# Patient Record
Sex: Female | Born: 1955
Health system: Southern US, Community
[De-identification: ages and names within clinical notes are randomized; demographics above are authoritative.]

## PROBLEM LIST (undated history)

## (undated) DIAGNOSIS — G4730 Sleep apnea, unspecified: Secondary | ICD-10-CM

## (undated) DIAGNOSIS — I10 Essential (primary) hypertension: Secondary | ICD-10-CM

## (undated) DIAGNOSIS — E559 Vitamin D deficiency, unspecified: Secondary | ICD-10-CM

## (undated) DIAGNOSIS — R7303 Prediabetes: Secondary | ICD-10-CM

## (undated) DIAGNOSIS — E663 Overweight: Secondary | ICD-10-CM

## (undated) DIAGNOSIS — R0600 Dyspnea, unspecified: Secondary | ICD-10-CM

## (undated) DIAGNOSIS — M549 Dorsalgia, unspecified: Secondary | ICD-10-CM

## (undated) DIAGNOSIS — M479 Spondylosis, unspecified: Secondary | ICD-10-CM

## (undated) DIAGNOSIS — R0609 Other forms of dyspnea: Secondary | ICD-10-CM

## (undated) DIAGNOSIS — K8020 Calculus of gallbladder without cholecystitis without obstruction: Secondary | ICD-10-CM

## (undated) DIAGNOSIS — E538 Deficiency of other specified B group vitamins: Secondary | ICD-10-CM

## (undated) DIAGNOSIS — K648 Other hemorrhoids: Secondary | ICD-10-CM

## (undated) DIAGNOSIS — E039 Hypothyroidism, unspecified: Secondary | ICD-10-CM

## (undated) DIAGNOSIS — R0602 Shortness of breath: Secondary | ICD-10-CM

## (undated) DIAGNOSIS — M2550 Pain in unspecified joint: Secondary | ICD-10-CM

## (undated) DIAGNOSIS — K219 Gastro-esophageal reflux disease without esophagitis: Secondary | ICD-10-CM

## (undated) DIAGNOSIS — K5900 Constipation, unspecified: Secondary | ICD-10-CM

## (undated) DIAGNOSIS — M1990 Unspecified osteoarthritis, unspecified site: Secondary | ICD-10-CM

## (undated) DIAGNOSIS — I517 Cardiomegaly: Secondary | ICD-10-CM

## (undated) DIAGNOSIS — T7840XA Allergy, unspecified, initial encounter: Secondary | ICD-10-CM

## (undated) DIAGNOSIS — R011 Cardiac murmur, unspecified: Secondary | ICD-10-CM

## (undated) DIAGNOSIS — E785 Hyperlipidemia, unspecified: Secondary | ICD-10-CM

## (undated) HISTORY — DX: Dorsalgia, unspecified: M54.9

## (undated) HISTORY — DX: Hyperlipidemia, unspecified: E78.5

## (undated) HISTORY — DX: Overweight: E66.3

## (undated) HISTORY — DX: Shortness of breath: R06.02

## (undated) HISTORY — DX: Cardiomegaly: I51.7

## (undated) HISTORY — DX: Dyspnea, unspecified: R06.00

## (undated) HISTORY — DX: Constipation, unspecified: K59.00

## (undated) HISTORY — DX: Essential (primary) hypertension: I10

## (undated) HISTORY — DX: Allergy, unspecified, initial encounter: T78.40XA

## (undated) HISTORY — DX: Other forms of dyspnea: R06.09

## (undated) HISTORY — DX: Pain in unspecified joint: M25.50

## (undated) HISTORY — DX: Sleep apnea, unspecified: G47.30

## (undated) HISTORY — DX: Cardiac murmur, unspecified: R01.1

## (undated) HISTORY — DX: Hypothyroidism, unspecified: E03.9

## (undated) HISTORY — DX: Unspecified osteoarthritis, unspecified site: M19.90

## (undated) HISTORY — DX: Spondylosis, unspecified: M47.9

## (undated) HISTORY — PX: COLONOSCOPY: SHX174

## (undated) HISTORY — DX: Other hemorrhoids: K64.8

## (undated) HISTORY — DX: Calculus of gallbladder without cholecystitis without obstruction: K80.20

## (undated) HISTORY — DX: Vitamin D deficiency, unspecified: E55.9

## (undated) HISTORY — DX: Deficiency of other specified B group vitamins: E53.8

---

## 1997-03-22 HISTORY — PX: WISDOM TOOTH EXTRACTION: SHX21

## 1998-01-27 ENCOUNTER — Ambulatory Visit (HOSPITAL_COMMUNITY): Admission: RE | Admit: 1998-01-27 | Discharge: 1998-01-27 | Payer: Self-pay | Admitting: Obstetrics and Gynecology

## 1998-01-30 ENCOUNTER — Ambulatory Visit (HOSPITAL_COMMUNITY): Admission: RE | Admit: 1998-01-30 | Discharge: 1998-01-30 | Payer: Self-pay | Admitting: Obstetrics and Gynecology

## 1998-02-28 ENCOUNTER — Other Ambulatory Visit: Admission: RE | Admit: 1998-02-28 | Discharge: 1998-02-28 | Payer: Self-pay | Admitting: Obstetrics and Gynecology

## 1998-03-10 ENCOUNTER — Other Ambulatory Visit: Admission: RE | Admit: 1998-03-10 | Discharge: 1998-03-10 | Payer: Self-pay | Admitting: Obstetrics and Gynecology

## 1999-07-31 ENCOUNTER — Encounter: Payer: Self-pay | Admitting: Internal Medicine

## 1999-07-31 ENCOUNTER — Ambulatory Visit (HOSPITAL_COMMUNITY): Admission: RE | Admit: 1999-07-31 | Discharge: 1999-07-31 | Payer: Self-pay | Admitting: Internal Medicine

## 1999-08-05 ENCOUNTER — Ambulatory Visit (HOSPITAL_COMMUNITY): Admission: RE | Admit: 1999-08-05 | Discharge: 1999-08-05 | Payer: Self-pay | Admitting: Obstetrics and Gynecology

## 1999-08-05 ENCOUNTER — Encounter: Payer: Self-pay | Admitting: Obstetrics and Gynecology

## 2000-06-08 ENCOUNTER — Other Ambulatory Visit: Admission: RE | Admit: 2000-06-08 | Discharge: 2000-06-08 | Payer: Self-pay | Admitting: Obstetrics and Gynecology

## 2000-09-21 ENCOUNTER — Encounter: Payer: Self-pay | Admitting: Internal Medicine

## 2000-09-21 ENCOUNTER — Ambulatory Visit (HOSPITAL_COMMUNITY): Admission: RE | Admit: 2000-09-21 | Discharge: 2000-09-21 | Payer: Self-pay | Admitting: Internal Medicine

## 2000-12-15 ENCOUNTER — Ambulatory Visit (HOSPITAL_COMMUNITY): Admission: RE | Admit: 2000-12-15 | Discharge: 2000-12-15 | Payer: Self-pay | Admitting: Obstetrics and Gynecology

## 2000-12-15 ENCOUNTER — Encounter: Payer: Self-pay | Admitting: Obstetrics and Gynecology

## 2001-12-20 ENCOUNTER — Ambulatory Visit (HOSPITAL_COMMUNITY): Admission: RE | Admit: 2001-12-20 | Discharge: 2001-12-20 | Payer: Self-pay | Admitting: Internal Medicine

## 2001-12-20 ENCOUNTER — Encounter: Payer: Self-pay | Admitting: Internal Medicine

## 2002-06-26 ENCOUNTER — Other Ambulatory Visit: Admission: RE | Admit: 2002-06-26 | Discharge: 2002-06-26 | Payer: Self-pay | Admitting: Obstetrics and Gynecology

## 2002-07-09 ENCOUNTER — Encounter: Payer: Self-pay | Admitting: Obstetrics and Gynecology

## 2002-07-09 ENCOUNTER — Ambulatory Visit (HOSPITAL_COMMUNITY): Admission: RE | Admit: 2002-07-09 | Discharge: 2002-07-09 | Payer: Self-pay | Admitting: Obstetrics and Gynecology

## 2003-05-16 ENCOUNTER — Ambulatory Visit (HOSPITAL_COMMUNITY): Admission: RE | Admit: 2003-05-16 | Discharge: 2003-05-16 | Payer: Self-pay | Admitting: Internal Medicine

## 2003-06-04 ENCOUNTER — Ambulatory Visit (HOSPITAL_COMMUNITY): Admission: RE | Admit: 2003-06-04 | Discharge: 2003-06-04 | Payer: Self-pay | Admitting: Internal Medicine

## 2003-07-02 ENCOUNTER — Other Ambulatory Visit: Admission: RE | Admit: 2003-07-02 | Discharge: 2003-07-02 | Payer: Self-pay | Admitting: Obstetrics and Gynecology

## 2003-07-11 ENCOUNTER — Ambulatory Visit (HOSPITAL_COMMUNITY): Admission: RE | Admit: 2003-07-11 | Discharge: 2003-07-11 | Payer: Self-pay | Admitting: Obstetrics and Gynecology

## 2005-01-04 ENCOUNTER — Emergency Department (HOSPITAL_COMMUNITY): Admission: EM | Admit: 2005-01-04 | Discharge: 2005-01-04 | Payer: Self-pay | Admitting: Emergency Medicine

## 2005-08-26 ENCOUNTER — Ambulatory Visit (HOSPITAL_COMMUNITY): Admission: RE | Admit: 2005-08-26 | Discharge: 2005-08-26 | Payer: Self-pay | Admitting: Internal Medicine

## 2005-11-04 ENCOUNTER — Ambulatory Visit (HOSPITAL_COMMUNITY): Admission: RE | Admit: 2005-11-04 | Discharge: 2005-11-04 | Payer: Self-pay | Admitting: Internal Medicine

## 2005-11-08 ENCOUNTER — Ambulatory Visit (HOSPITAL_COMMUNITY): Admission: RE | Admit: 2005-11-08 | Discharge: 2005-11-08 | Payer: Self-pay | Admitting: Internal Medicine

## 2006-01-03 ENCOUNTER — Ambulatory Visit (HOSPITAL_COMMUNITY): Admission: RE | Admit: 2006-01-03 | Discharge: 2006-01-03 | Payer: Self-pay | Admitting: Obstetrics and Gynecology

## 2006-01-25 ENCOUNTER — Other Ambulatory Visit: Admission: RE | Admit: 2006-01-25 | Discharge: 2006-01-25 | Payer: Self-pay | Admitting: Obstetrics and Gynecology

## 2007-04-26 ENCOUNTER — Ambulatory Visit (HOSPITAL_COMMUNITY): Admission: RE | Admit: 2007-04-26 | Discharge: 2007-04-26 | Payer: Self-pay | Admitting: Obstetrics and Gynecology

## 2007-12-31 ENCOUNTER — Ambulatory Visit (HOSPITAL_COMMUNITY): Admission: RE | Admit: 2007-12-31 | Discharge: 2007-12-31 | Payer: Self-pay | Admitting: Emergency Medicine

## 2008-02-20 ENCOUNTER — Ambulatory Visit: Payer: Self-pay | Admitting: Internal Medicine

## 2008-03-01 ENCOUNTER — Encounter: Payer: Self-pay | Admitting: Internal Medicine

## 2008-03-01 ENCOUNTER — Ambulatory Visit: Payer: Self-pay | Admitting: Internal Medicine

## 2008-03-04 ENCOUNTER — Encounter: Payer: Self-pay | Admitting: Internal Medicine

## 2008-03-22 HISTORY — PX: KNEE SURGERY: SHX244

## 2008-04-19 DIAGNOSIS — I10 Essential (primary) hypertension: Secondary | ICD-10-CM

## 2008-04-19 DIAGNOSIS — K648 Other hemorrhoids: Secondary | ICD-10-CM

## 2008-04-19 DIAGNOSIS — E039 Hypothyroidism, unspecified: Secondary | ICD-10-CM | POA: Insufficient documentation

## 2008-04-19 HISTORY — DX: Essential (primary) hypertension: I10

## 2008-04-19 HISTORY — DX: Other hemorrhoids: K64.8

## 2008-04-19 HISTORY — DX: Hypothyroidism, unspecified: E03.9

## 2008-04-23 ENCOUNTER — Ambulatory Visit: Payer: Self-pay | Admitting: Internal Medicine

## 2008-04-23 DIAGNOSIS — R110 Nausea: Secondary | ICD-10-CM | POA: Insufficient documentation

## 2008-04-23 DIAGNOSIS — R143 Flatulence: Secondary | ICD-10-CM

## 2008-04-23 DIAGNOSIS — R142 Eructation: Secondary | ICD-10-CM

## 2008-04-23 DIAGNOSIS — K5900 Constipation, unspecified: Secondary | ICD-10-CM

## 2008-04-23 DIAGNOSIS — R141 Gas pain: Secondary | ICD-10-CM | POA: Insufficient documentation

## 2008-04-23 HISTORY — DX: Constipation, unspecified: K59.00

## 2008-05-08 ENCOUNTER — Ambulatory Visit (HOSPITAL_COMMUNITY): Admission: RE | Admit: 2008-05-08 | Discharge: 2008-05-08 | Payer: Self-pay | Admitting: Internal Medicine

## 2008-05-10 ENCOUNTER — Encounter: Payer: Self-pay | Admitting: Internal Medicine

## 2008-05-10 DIAGNOSIS — K8020 Calculus of gallbladder without cholecystitis without obstruction: Secondary | ICD-10-CM

## 2008-05-10 HISTORY — DX: Calculus of gallbladder without cholecystitis without obstruction: K80.20

## 2008-05-13 ENCOUNTER — Encounter: Payer: Self-pay | Admitting: Internal Medicine

## 2008-06-11 ENCOUNTER — Encounter: Payer: Self-pay | Admitting: Internal Medicine

## 2008-07-23 ENCOUNTER — Encounter: Admission: RE | Admit: 2008-07-23 | Discharge: 2008-07-23 | Payer: Self-pay | Admitting: Internal Medicine

## 2008-09-04 ENCOUNTER — Encounter: Admission: RE | Admit: 2008-09-04 | Discharge: 2008-09-04 | Payer: Self-pay | Admitting: Orthopedic Surgery

## 2008-09-13 ENCOUNTER — Ambulatory Visit (HOSPITAL_COMMUNITY): Admission: RE | Admit: 2008-09-13 | Discharge: 2008-09-13 | Payer: Self-pay | Admitting: Obstetrics and Gynecology

## 2008-09-18 ENCOUNTER — Encounter: Admission: RE | Admit: 2008-09-18 | Discharge: 2008-09-18 | Payer: Self-pay | Admitting: Obstetrics and Gynecology

## 2008-10-30 ENCOUNTER — Ambulatory Visit (HOSPITAL_COMMUNITY): Admission: RE | Admit: 2008-10-30 | Discharge: 2008-10-30 | Payer: Self-pay | Admitting: Orthopedic Surgery

## 2008-11-21 ENCOUNTER — Encounter: Admission: RE | Admit: 2008-11-21 | Discharge: 2008-12-24 | Payer: Self-pay | Admitting: Orthopedic Surgery

## 2009-02-19 ENCOUNTER — Encounter: Admission: RE | Admit: 2009-02-19 | Discharge: 2009-02-19 | Payer: Self-pay | Admitting: Orthopedic Surgery

## 2009-04-03 ENCOUNTER — Encounter: Admission: RE | Admit: 2009-04-03 | Discharge: 2009-07-02 | Payer: Self-pay | Admitting: Orthopedic Surgery

## 2009-07-18 ENCOUNTER — Encounter: Admission: RE | Admit: 2009-07-18 | Discharge: 2009-07-18 | Payer: Self-pay | Admitting: Orthopedic Surgery

## 2009-08-25 ENCOUNTER — Encounter: Admission: RE | Admit: 2009-08-25 | Discharge: 2009-08-25 | Payer: Self-pay | Admitting: Obstetrics and Gynecology

## 2010-04-11 ENCOUNTER — Encounter: Payer: Self-pay | Admitting: Internal Medicine

## 2010-04-12 ENCOUNTER — Encounter: Payer: Self-pay | Admitting: Obstetrics and Gynecology

## 2010-04-12 ENCOUNTER — Encounter: Payer: Self-pay | Admitting: Internal Medicine

## 2010-04-12 ENCOUNTER — Encounter: Payer: Self-pay | Admitting: Orthopedic Surgery

## 2010-06-27 LAB — BASIC METABOLIC PANEL
BUN: 17 mg/dL (ref 6–23)
CO2: 29 mEq/L (ref 19–32)
Chloride: 108 mEq/L (ref 96–112)
Glucose, Bld: 99 mg/dL (ref 70–99)
Potassium: 4.1 mEq/L (ref 3.5–5.1)

## 2010-06-27 LAB — CBC
HCT: 34.9 % — ABNORMAL LOW (ref 36.0–46.0)
MCV: 80.5 fL (ref 78.0–100.0)
Platelets: 248 10*3/uL (ref 150–400)
RDW: 16.5 % — ABNORMAL HIGH (ref 11.5–15.5)

## 2010-08-04 NOTE — Op Note (Signed)
NAMESARENA, JEZEK             ACCOUNT NO.:  [id number]   MEDICAL RECORD NO.:  [id number]          PATIENT TYPE:  AMB   LOCATION:  SDS                          FACILITY:  [facility]   PHYSICIAN:  Myrtie Neither, MD      DATE OF BIRTH:  1955/12/06   DATE OF PROCEDURE:  10/30/2008  DATE OF DISCHARGE:                               OPERATIVE REPORT   PREOPERATIVE DIAGNOSIS:  Medial meniscal tear, right knee.   POSTOPERATIVE DIAGNOSES:  Medial meniscal tear, chondral defect medial  femoral condyle, and intercondylar notch defect, patellar plica.   ANESTHESIA:  General.   PROCEDURES:  1. Arthroscopic partial medial meniscectomy.  2. Abrasion chondroplasty, medial femoral condyle.  3. Excision of patellar plica and synovectomy.   The patient was taken to the operating room after given adequate preop  medications, given general anesthesia and intubated, the right knee was  prepped with DuraPrep and draped in a sterile manner.  Tourniquet used  for hemostasis.  One-half inch puncture wound made along the  anteromedial and lateral joint line.  Inflow was through the medial  patellar pouch area.  Inspection of the joint revealed hypertrophic  overgrown patellar plica encompassing the entire anterior compartment  and medial compartment.  Chondral defect involving the medial femoral  condyle approximately the size of a fifty-cent piece and tear of the  medial meniscus along the posterior horn.  There is also chondral defect  at the intercondylar notch with loose edges.  With the basket forceps,  partial meniscectomy was done of the medial meniscus followed by use of  the meniscal synovial shaver for resecting the patellar plica and  synovectomy.  Abrasion chondroplasty was done for the medial femoral  condyle.  Loose fragments were removed with the synovial shaver.  The  chondral defect about the intercondylar notch was also smoothed down  with basket forceps removing the jagged edges.   Lateral compartment was  fairly well preserved.  ACL was intact.  Wound closure was then done  with 4-0 nylon.  A 14 mL of 0.25% plain Marcaine was injected into the  knee.  Compressive dressing was applied.  The patient tolerated the  procedure quite well, taken to recovery room in stable and satisfactory  condition.   The patient being discharged home, nonweightbearing on the right side  with use of crutches, ice packs, elevation.  Return to the office in 1  week.  The patient being discharged on Percocet 1-2 q.6 p.r.n. for pain.      Myrtie Neither, MD  Electronically Signed     AC/MEDQ  D:  10/30/2008  T:  10/30/2008  Job:  [id number]

## 2010-08-08 IMAGING — NM NM HEPATO W/GB/PHARM/[PERSON_NAME]
2 series · 12 of 12 positions shown · non-contrast
Comparison: none

CLINICAL DATA: Abdominal pain, cholelithiasis

NUCLEAR MEDICINE HEPATOBILIARY IMAGING WITH GALLBLADDER EF
TECHNIQUE: Sequential images of the abdomen were obtained [DATE]
minutes following intravenous administration of
radiopharmaceutical.  After slow intravenous infusion of 2.5 ucg
Cholecystokinin, gallbladder ejection fraction was determined.
Radiopharmaceutical:  5.2 mCi Zc-OOm Choletec

[Series 1: he hepato · 4.71mm/px · 6 of 30 frames shown (1 of 2)]
[frame 3/30]
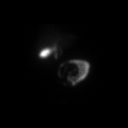
[frame 8/30]
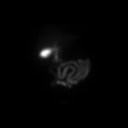
[frame 13/30]
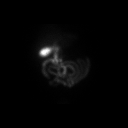
[frame 18/30]
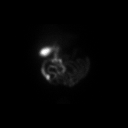
[frame 23/30]
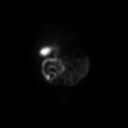
[frame 28/30]
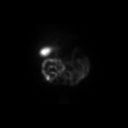

[Series 1: he hepato · 4.71mm/px · 6 of 60 frames shown (2 of 2)]
[frame 6/60]
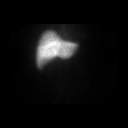
[frame 16/60]
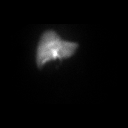
[frame 26/60]
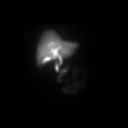
[frame 36/60]
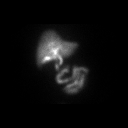
[frame 46/60]
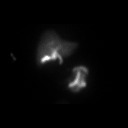
[frame 56/60]
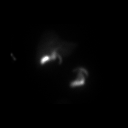

[12 of 12 positions shown; findings below may reference images not displayed]

FINDINGS: There is prompt extraction of radiotracer from the blood
pool and homogeneous uptake within the liver.  The gallbladder is
evident by 25 minutes.  Counts are present within the bowel by 25
minutes.  Upon administration of a cholecystokinin, the gallbladder
contracts minimally with a calculated ejection fraction = 16%  at
30 min (Normal greater than 30 % ejection)
IMPRESSION: 1. Abnormal low gallbladder ejection fraction =  16% suggests
biliary dyskinesia.

2. Patent cystic duct and common bile duct.

## 2010-11-04 ENCOUNTER — Other Ambulatory Visit (HOSPITAL_COMMUNITY): Payer: Self-pay | Admitting: Obstetrics and Gynecology

## 2010-11-04 DIAGNOSIS — Z1231 Encounter for screening mammogram for malignant neoplasm of breast: Secondary | ICD-10-CM

## 2010-11-10 ENCOUNTER — Ambulatory Visit (HOSPITAL_COMMUNITY)
Admission: RE | Admit: 2010-11-10 | Discharge: 2010-11-10 | Disposition: A | Discharge status: Home / Self Care | Payer: BC Managed Care – PPO | Source: Ambulatory Visit | Attending: Obstetrics and Gynecology | Admitting: Obstetrics and Gynecology

## 2010-11-10 DIAGNOSIS — Z1231 Encounter for screening mammogram for malignant neoplasm of breast: Secondary | ICD-10-CM | POA: Insufficient documentation

## 2011-07-06 ENCOUNTER — Other Ambulatory Visit: Payer: Self-pay | Admitting: Internal Medicine

## 2011-07-06 ENCOUNTER — Ambulatory Visit
Admission: RE | Admit: 2011-07-06 | Discharge: 2011-07-06 | Disposition: A | Discharge status: Home / Self Care | Payer: BC Managed Care – PPO | Source: Ambulatory Visit | Attending: Internal Medicine | Admitting: Internal Medicine

## 2011-07-06 DIAGNOSIS — R05 Cough: Secondary | ICD-10-CM

## 2011-12-03 ENCOUNTER — Other Ambulatory Visit: Payer: Self-pay | Admitting: Internal Medicine

## 2011-12-03 ENCOUNTER — Ambulatory Visit
Admission: RE | Admit: 2011-12-03 | Discharge: 2011-12-03 | Disposition: A | Discharge status: Home / Self Care | Payer: BC Managed Care – PPO | Source: Ambulatory Visit | Attending: Internal Medicine | Admitting: Internal Medicine

## 2011-12-03 DIAGNOSIS — R05 Cough: Secondary | ICD-10-CM

## 2012-01-11 ENCOUNTER — Other Ambulatory Visit: Payer: Self-pay | Admitting: Obstetrics and Gynecology

## 2012-01-11 DIAGNOSIS — Z1231 Encounter for screening mammogram for malignant neoplasm of breast: Secondary | ICD-10-CM

## 2012-02-02 ENCOUNTER — Ambulatory Visit: Payer: Self-pay | Admitting: Obstetrics and Gynecology

## 2012-02-10 ENCOUNTER — Ambulatory Visit (INDEPENDENT_AMBULATORY_CARE_PROVIDER_SITE_OTHER): Payer: BC Managed Care – PPO | Admitting: Obstetrics and Gynecology

## 2012-02-10 ENCOUNTER — Encounter: Payer: Self-pay | Admitting: Obstetrics and Gynecology

## 2012-02-10 VITALS — BP 118/78 | Ht 65.6 in | Wt 270.0 lb

## 2012-02-10 DIAGNOSIS — Z01419 Encounter for gynecological examination (general) (routine) without abnormal findings: Secondary | ICD-10-CM

## 2012-02-10 DIAGNOSIS — Z124 Encounter for screening for malignant neoplasm of cervix: Secondary | ICD-10-CM

## 2012-02-10 DIAGNOSIS — N904 Leukoplakia of vulva: Secondary | ICD-10-CM

## 2012-02-10 MED ORDER — NYSTATIN-TRIAMCINOLONE 100000-0.1 UNIT/GM-% EX OINT: TOPICAL_OINTMENT | Freq: Two times a day (BID) | CUTANEOUS | Status: DC | PRN

## 2012-02-10 MED ORDER — CLOBETASOL PROPIONATE 0.05 % EX OINT: TOPICAL_OINTMENT | Freq: Two times a day (BID) | CUTANEOUS | Status: DC

## 2012-02-10 NOTE — Progress Notes (Signed)
Last Pap: 01/29/2010 WNL: Yes Regular Periods:no Contraception: none  Monthly Breast exam:yes Tetanus<63yrs:no Nl.Bladder Function:yes Daily BMs:yes Healthy Diet:yes Calcium:yes Mammogram:yes Date of Mammogram: 11/04/2010 Exercise:yes Have often Exercise: 3 times a week  Seatbelt: yes Abuse at home: no Stressful work:no Sigmoid-colonoscopy: 4 years ago  Bone Density: Yes 02/03/11 PCP: Dr.Shelton Change in PMH: unchanged Change in ZOX:[id number]   Subjective:    CELE MOTE is a 56 y.o. female G2P2002 who presents for annual exam.  The patient has no complaints today. No significant menopausal sx.  Lichen sclerosis stable with prn clobetasol use.  Intermittent episodes of groin itching relieved by mycolog.  The following portions of the patient's history were reviewed and updated as appropriate: allergies, current medications, past family history, past medical history, past social history, past surgical history and problem list.  Review of Systems Pertinent items are noted in HPI. Gastrointestinal:No change in bowel habits, no abdominal pain, no rectal bleeding Genitourinary:negative for dysuria, frequency, hematuria, nocturia and urinary incontinence    Objective:     BP 118/78  Ht 5' 5.6" (1.666 m)  Wt 270 lb (122.471 kg)  BMI 44.11 kg/m2  Weight:  Wt Readings from Last 1 Encounters:  02/10/12 270 lb (122.471 kg)     BMI: Body mass index is 44.11 kg/(m^2). General Appearance: Alert, appropriate appearance for age. No acute distress HEENT: Grossly normal Neck / Thyroid: Supple, no masses, nodes or enlargement Lungs: clear to auscultation bilaterally Back: No CVA tenderness Breast Exam: No masses or nodes.No dimpling, nipple retraction or discharge. Cardiovascular: Regular rate and rhythm. S1, S2, no murmur Gastrointestinal: Soft, non-tender, no masses or organomegaly Pelvic Exam: External genitalia: Hypopigmented area at posterior fourchette, stable Vaginal:  atrophic mucosa Cervix: normal appearance Adnexa: no masses Uterus: does not feel enlarged but limited exam Exam limited by body habitus Rectovaginal: normal rectal, no masses Lymphatic Exam: Non-palpable nodes in neck, clavicular, axillary, or inguinal regions Skin: no rash or abnormalities Neurologic: Normal gait and speech, no tremor  Psychiatric: Alert and oriented, appropriate affect.    Urinalysis:Not done    Assessment:    Lichen sclerosis stable   Intermittent episodes of fungal intertrigo   Plan:   mammogram pap smear with HR HPV return annually or prn Clobetasol Mycolog prn

## 2012-02-15 ENCOUNTER — Ambulatory Visit
Admission: RE | Admit: 2012-02-15 | Discharge: 2012-02-15 | Disposition: A | Discharge status: Home / Self Care | Payer: BC Managed Care – PPO | Source: Ambulatory Visit | Attending: Obstetrics and Gynecology | Admitting: Obstetrics and Gynecology

## 2012-02-15 DIAGNOSIS — Z1231 Encounter for screening mammogram for malignant neoplasm of breast: Secondary | ICD-10-CM

## 2013-01-04 ENCOUNTER — Encounter: Payer: Self-pay | Admitting: Internal Medicine

## 2013-01-31 ENCOUNTER — Encounter: Payer: Self-pay | Admitting: Internal Medicine

## 2013-02-22 ENCOUNTER — Other Ambulatory Visit: Payer: Self-pay

## 2013-02-22 ENCOUNTER — Other Ambulatory Visit: Payer: Self-pay | Admitting: Obstetrics and Gynecology

## 2013-02-22 DIAGNOSIS — Z1231 Encounter for screening mammogram for malignant neoplasm of breast: Secondary | ICD-10-CM

## 2013-03-12 ENCOUNTER — Ambulatory Visit (HOSPITAL_COMMUNITY)
Admission: RE | Admit: 2013-03-12 | Discharge: 2013-03-12 | Disposition: A | Discharge status: Home / Self Care | Payer: BC Managed Care – PPO | Source: Ambulatory Visit | Attending: Obstetrics and Gynecology | Admitting: Obstetrics and Gynecology

## 2013-03-12 DIAGNOSIS — Z1231 Encounter for screening mammogram for malignant neoplasm of breast: Secondary | ICD-10-CM | POA: Insufficient documentation

## 2013-03-13 ENCOUNTER — Ambulatory Visit (AMBULATORY_SURGERY_CENTER): Payer: Self-pay | Admitting: *Deleted

## 2013-03-13 VITALS — Ht 66.0 in | Wt 272.4 lb

## 2013-03-13 DIAGNOSIS — Z86010 Personal history of colonic polyps: Secondary | ICD-10-CM

## 2013-03-13 MED ORDER — MOVIPREP 100 G PO SOLR: ORAL | Status: DC

## 2013-03-13 NOTE — Progress Notes (Signed)
No allergies to eggs or soy. No problems with anesthesia.  

## 2013-03-14 ENCOUNTER — Encounter: Payer: Self-pay | Admitting: Internal Medicine

## 2013-03-26 ENCOUNTER — Encounter: Payer: Self-pay | Admitting: Internal Medicine

## 2013-03-26 ENCOUNTER — Ambulatory Visit (AMBULATORY_SURGERY_CENTER): Payer: BC Managed Care – PPO | Admitting: Internal Medicine

## 2013-03-26 VITALS — BP 133/85 | HR 56 | Temp 97.5°F | Resp 16 | Ht 66.0 in | Wt 272.0 lb

## 2013-03-26 DIAGNOSIS — Z86010 Personal history of colonic polyps: Secondary | ICD-10-CM

## 2013-03-26 MED ORDER — SODIUM CHLORIDE 0.9 % IV SOLN: 500.0000 mL | INTRAVENOUS | Status: DC

## 2013-03-26 NOTE — Progress Notes (Signed)
Report to pacu rn, vss, bbs=clear 

## 2013-03-26 NOTE — Op Note (Signed)
[facility] [address] [place], [id number]   COLONOSCOPY PROCEDURE REPORT  PATIENT: Ashley SidleRogers, Myles F.  MR#: [id number] BIRTHDATE: Aug 26, 1955 , 57  yrs. old GENDER: Female ENDOSCOPIST: Roxy CedarJohn N Perry Jr, MD REFERRED XB:JYNWGNFAOZHYBY:Surveillance Program Recall PROCEDURE DATE:  03/26/2013 PROCEDURE:   Colonoscopy, surveillance First Screening Colonoscopy - Avg.  risk and is 50 yrs.  old or older - No.  Prior Negative Screening - Now for repeat screening. N/A  History of Adenoma - Now for follow-up colonoscopy & has been > or = to 3 yrs.  Yes hx of adenoma.  Has been 3 or more years since last colonoscopy.  Polyps Removed Today? No.  Recommend repeat exam, <10 yrs? No. ASA CLASS:   Class II INDICATIONS:Patient's personal history of adenomatous colon polyps. Index 2004 (negative); 02-2008 w/ small TAs MEDICATIONS: MAC sedation, administered by CRNA and propofol (Diprivan) 230mg  IV  DESCRIPTION OF PROCEDURE:   After the risks benefits and alternatives of the procedure were thoroughly explained, informed consent was obtained.  A digital rectal exam revealed no abnormalities of the rectum.   The LB [id number]  endoscope was introduced through the anus and advanced to the cecum, which was identified by both the appendix and ileocecal valve. No adverse events experienced.   The quality of the prep was excellent, using MoviPrep  The instrument was then slowly withdrawn as the colon was fully examined.      COLON FINDINGS: A normal appearing cecum, ileocecal valve, and appendiceal orifice were identified.  The ascending, hepatic flexure, transverse, splenic flexure, descending, sigmoid colon and rectum appeared unremarkable.  No polyps or cancers were seen. Retroflexed views revealed internal hemorrhoids. The time to cecum=3 minutes 36 seconds.  Withdrawal time=14 minutes 54 seconds. The scope was withdrawn and the procedure completed.  COMPLICATIONS: There were no  complications.  ENDOSCOPIC IMPRESSION: 1. Normal colon  RECOMMENDATIONS: 1. Continue current colorectal surveillance  recommendations  with a repeat colonoscopy in 10 years.   eSigned:  Roxy CedarJohn N Perry Jr, MD 03/26/2013 2:17 PM   cc: Kellie ShropshireKim Shelton, MD and The Patient

## 2013-03-26 NOTE — Patient Instructions (Signed)
YOU HAD AN ENDOSCOPIC PROCEDURE TODAY AT THE [facility]: Refer to the procedure report that was given to you for any specific questions about what was found during the examination.  If the procedure report does not answer your questions, please call your gastroenterologist to clarify.  If you requested that your care partner not be given the details of your procedure findings, then the procedure report has been included in a sealed envelope for you to review at your convenience later.  YOU SHOULD EXPECT: Some feelings of bloating in the abdomen. Passage of more gas than usual.  Walking can help get rid of the air that was put into your GI tract during the procedure and reduce the bloating. If you had a lower endoscopy (such as a colonoscopy or flexible sigmoidoscopy) you may notice spotting of blood in your stool or on the toilet paper. If you underwent a bowel prep for your procedure, then you may not have a normal bowel movement for a few days.  DIET: Your first meal following the procedure should be a light meal and then it is ok to progress to your normal diet.  A half-sandwich or bowl of soup is an example of a good first meal.  Heavy or fried foods are harder to digest and may make you feel nauseous or bloated.  Likewise meals heavy in dairy and vegetables can cause extra gas to form and this can also increase the bloating.  Drink plenty of fluids but you should avoid alcoholic beverages for 24 hours.  ACTIVITY: Your care partner should take you home directly after the procedure.  You should plan to take it easy, moving slowly for the rest of the day.  You can resume normal activity the day after the procedure however you should NOT DRIVE or use heavy machinery for 24 hours (because of the sedation medicines used during the test).    SYMPTOMS TO REPORT IMMEDIATELY: A gastroenterologist can be reached at any hour.  During normal business hours, 8:30 AM to 5:00 PM Monday through Friday,  call [phone].  After hours and on weekends, please call the GI answering service at [phone] who will take a message and have the physician on call contact you.   Following lower endoscopy (colonoscopy or flexible sigmoidoscopy):  Excessive amounts of blood in the stool  Significant tenderness or worsening of abdominal pains  Swelling of the abdomen that is new, acute  Fever of 100F or higher    FOLLOW UP: If any biopsies were taken you will be contacted by phone or by letter within the next 1-3 weeks.  Call your gastroenterologist if you have not heard about the biopsies in 3 weeks.  Our staff will call the home number listed on your records the next business day following your procedure to check on you and address any questions or concerns that you may have at that time regarding the information given to you following your procedure. This is a courtesy call and so if there is no answer at the home number and we have not heard from you through the emergency physician on call, we will assume that you have returned to your regular daily activities without incident.  SIGNATURES/CONFIDENTIALITY: You and/or your care partner have signed paperwork which will be entered into your electronic medical record.  These signatures attest to the fact that that the information above on your After Visit Summary has been reviewed and is understood.  Full responsibility of the confidentiality   of this discharge information lies with you and/or your care-partner.     

## 2013-03-27 ENCOUNTER — Telehealth: Payer: Self-pay

## 2013-03-27 NOTE — Telephone Encounter (Signed)
  Follow up Call-  Call back number 03/26/2013  Post procedure Call Back phone  # [phone]  Permission to leave phone message Yes     Patient questions:  Do you have a fever, pain , or abdominal swelling? no Pain Score  0 *  Have you tolerated food without any problems? yes  Have you been able to return to your normal activities? yes  Do you have any questions about your discharge instructions: Diet   no Medications  no Follow up visit  no  Do you have questions or concerns about your Care? no  Actions: * If pain score is 4 or above: No action needed, pain <4.

## 2014-01-04 ENCOUNTER — Ambulatory Visit
Admission: RE | Admit: 2014-01-04 | Discharge: 2014-01-04 | Disposition: A | Discharge status: Home / Self Care | Payer: BC Managed Care – PPO | Source: Ambulatory Visit | Attending: Internal Medicine | Admitting: Internal Medicine

## 2014-01-04 ENCOUNTER — Other Ambulatory Visit: Payer: Self-pay | Admitting: Internal Medicine

## 2014-01-04 DIAGNOSIS — R7989 Other specified abnormal findings of blood chemistry: Secondary | ICD-10-CM

## 2014-01-21 ENCOUNTER — Encounter: Payer: Self-pay | Admitting: Internal Medicine

## 2014-02-21 ENCOUNTER — Ambulatory Visit (HOSPITAL_BASED_OUTPATIENT_CLINIC_OR_DEPARTMENT_OTHER): Payer: BC Managed Care – PPO

## 2014-03-19 ENCOUNTER — Other Ambulatory Visit (HOSPITAL_COMMUNITY): Payer: Self-pay | Admitting: Obstetrics and Gynecology

## 2014-03-19 DIAGNOSIS — Z1231 Encounter for screening mammogram for malignant neoplasm of breast: Secondary | ICD-10-CM

## 2014-03-20 ENCOUNTER — Ambulatory Visit (HOSPITAL_COMMUNITY)
Admission: RE | Admit: 2014-03-20 | Discharge: 2014-03-20 | Disposition: A | Discharge status: Home / Self Care | Payer: BC Managed Care – PPO | Source: Ambulatory Visit | Attending: Obstetrics and Gynecology | Admitting: Obstetrics and Gynecology

## 2014-03-20 DIAGNOSIS — Z1231 Encounter for screening mammogram for malignant neoplasm of breast: Secondary | ICD-10-CM | POA: Insufficient documentation

## 2014-04-16 ENCOUNTER — Ambulatory Visit (HOSPITAL_BASED_OUTPATIENT_CLINIC_OR_DEPARTMENT_OTHER): Payer: BC Managed Care – PPO | Attending: Internal Medicine

## 2014-04-16 VITALS — Ht 66.0 in | Wt 270.0 lb

## 2014-04-16 DIAGNOSIS — R0683 Snoring: Secondary | ICD-10-CM | POA: Diagnosis not present

## 2014-04-16 DIAGNOSIS — G4733 Obstructive sleep apnea (adult) (pediatric): Secondary | ICD-10-CM | POA: Diagnosis present

## 2014-04-20 DIAGNOSIS — G4733 Obstructive sleep apnea (adult) (pediatric): Secondary | ICD-10-CM

## 2014-04-20 NOTE — Sleep Study (Signed)
   NAME: Ashley SidleJennifer F Panik DATE OF BIRTH:  12/12/1955 MEDICAL RECORD NUMBER [id number]  LOCATION: [facility]  PHYSICIAN: Llana Deshazo D  DATE OF STUDY: 04/16/2014  SLEEP STUDY TYPE: Nocturnal Polysomnogram               REFERRING PHYSICIAN: Polite, Deirdre Peeronald D, MD  INDICATION FOR STUDY: Hypersomnia with sleep apnea  EPWORTH SLEEPINESS SCORE:   13/24 HEIGHT: 5\' 6"  (167.6 cm)  WEIGHT: 270 lb (122.471 kg)    Body mass index is 43.6 kg/(m^2).  NECK SIZE: 15 in.  MEDICATIONS: Charted for review  SLEEP ARCHITECTURE: Total sleep time 280.5 minutes with sleep efficiency 77%. Stage I was 5.2%, stage II 66%, stage III absent, REM 28.9% of total sleep time. Sleep latency 35 minutes, REM latency 126.5 minutes, awake after sleep onset 49 minutes, arousal index 8.6, bedtime medication: zorvolex, Atorvastatin, losartan/HCTZ  RESPIRATORY DATA: Apnea hypopnea index (AHI) 29.1 per hour. 136 total events scored including 52 obstructive apneas, 5 central apneas, 79 hypopneas. Non-positional events. REM AHI 78.5 per hour. Delayed sleep onset and lack of sufficient early respiratory events prevented CPAP titration by protocol.  OXYGEN DATA: Moderate to loud snoring with oxygen desaturation to a nadir of 71% and mean saturation 92.9% on room air  CARDIAC DATA: Sinus rhythm with frequent PVCs  MOVEMENT/PARASOMNIA: No significant movement disturbance, no bathroom trips  IMPRESSION/ RECOMMENDATION:   1) Moderate obstructive sleep apnea/hypopnea syndrome, AHI 29.1 per hour with non-positional events. REM AHI 78.5 per hour. Moderate to loud snoring with oxygen desaturation to a nadir of 71% and mean saturation 92.9% on room air area 2) She did not achieve sleep until after midnight and there were too few early respiratory events to qualify for split protocol CPAP titration on this study. This patient can return for dedicated CPAP titration study if desired.   [name],[name] D Diplomate,  American Board of Sleep Medicine  ELECTRONICALLY SIGNED ON:  04/20/2014, 11:19 AM [facility] PH: [phone]   FX: [phone] ACCREDITED BY THE AMERICAN ACADEMY OF SLEEP MEDICINE

## 2014-05-23 ENCOUNTER — Ambulatory Visit (HOSPITAL_BASED_OUTPATIENT_CLINIC_OR_DEPARTMENT_OTHER): Payer: BC Managed Care – PPO | Attending: Internal Medicine | Admitting: Radiology

## 2014-05-23 VITALS — Ht 66.0 in | Wt 265.0 lb

## 2014-05-23 DIAGNOSIS — G4719 Other hypersomnia: Secondary | ICD-10-CM | POA: Diagnosis present

## 2014-05-23 DIAGNOSIS — G4710 Hypersomnia, unspecified: Secondary | ICD-10-CM | POA: Diagnosis not present

## 2014-05-23 DIAGNOSIS — G4730 Sleep apnea, unspecified: Secondary | ICD-10-CM | POA: Diagnosis not present

## 2014-05-23 DIAGNOSIS — G4733 Obstructive sleep apnea (adult) (pediatric): Secondary | ICD-10-CM

## 2014-05-26 DIAGNOSIS — G4733 Obstructive sleep apnea (adult) (pediatric): Secondary | ICD-10-CM

## 2014-05-26 NOTE — Sleep Study (Signed)
   NAME: Ashley York DATE OF BIRTH:  Apr 07, 1955 MEDICAL RECORD NUMBER [id number]  LOCATION: [facility]  PHYSICIAN: Naviah Belfield D  DATE OF STUDY: 05/23/2014  SLEEP STUDY TYPE: Nocturnal Polysomnogram               REFERRING PHYSICIAN: Polite, Deirdre Peeronald D, MD  INDICATION FOR STUDY: Hypersomnia with sleep apnea-CPAP titration  EPWORTH SLEEPINESS SCORE:   14/24 HEIGHT: 5\' 6"  (167.6 cm)  WEIGHT: 120.203 kg (265 lb)    Body mass index is 42.79 kg/(m^2).  NECK SIZE: 15 in.  MEDICATIONS: Charted for review  SLEEP ARCHITECTURE: Total sleep time 348.5 minutes with sleep efficiency 83.7%. Stage I was 3.9%, stage II 65.6%, stage III 13.5%, REM 17.1% of total sleep time. Sleep latency 36 minutes, REM latency 169 minutes, awake after sleep onset 32 minutes, arousal index 10.7, bedtime medication: Lipitor, fish oil, levothyroxine, Hyzaar  RESPIRATORY DATA: CPAP titration protocol. CPAP was titrated to 9 CWP, AHI 0.0 per hour. She wore a medium fullface mask.  OXYGEN DATA: Snoring was prevented and mean oxygen saturation was 95.9% with CPAP.  CARDIAC DATA: Sinus rhythm with PVCs  MOVEMENT/PARASOMNIA: No significant movement disturbance, no bathroom trips  IMPRESSION/ RECOMMENDATION:   1) Successful CPAP titration to 9 CWP, AHI 0.0 per hour. She wore a medium ResMedAirFit F10 fullface mask with heated humidifier. Snoring was prevented and mean oxygen saturation was 95.9%. 2) Baseline polysomnogram on 04/16/2014 recorded AHI 29.1 per hour with body weight 270 pounds.   [name],Kadden Osterhout D Diplomate, American Board of Sleep Medicine  ELECTRONICALLY SIGNED ON:  05/26/2014, 4:36 PM [facility] PH: [phone]   FX: [phone] ACCREDITED BY THE AMERICAN ACADEMY OF SLEEP MEDICINE

## 2014-07-29 ENCOUNTER — Encounter: Payer: Self-pay | Admitting: Internal Medicine

## 2015-04-04 ENCOUNTER — Ambulatory Visit
Admission: RE | Admit: 2015-04-04 | Discharge: 2015-04-04 | Disposition: A | Discharge status: Home / Self Care | Payer: BC Managed Care – PPO | Source: Ambulatory Visit | Attending: Internal Medicine | Admitting: Internal Medicine

## 2015-04-04 ENCOUNTER — Other Ambulatory Visit: Payer: Self-pay | Admitting: Internal Medicine

## 2015-04-04 DIAGNOSIS — R0789 Other chest pain: Secondary | ICD-10-CM

## 2017-04-11 ENCOUNTER — Other Ambulatory Visit: Payer: Self-pay | Admitting: Internal Medicine

## 2017-04-11 ENCOUNTER — Ambulatory Visit
Admission: RE | Admit: 2017-04-11 | Discharge: 2017-04-11 | Disposition: A | Discharge status: Home / Self Care | Payer: BC Managed Care – PPO | Source: Ambulatory Visit | Attending: Internal Medicine | Admitting: Internal Medicine

## 2017-04-11 DIAGNOSIS — R198 Other specified symptoms and signs involving the digestive system and abdomen: Secondary | ICD-10-CM

## 2017-04-18 ENCOUNTER — Other Ambulatory Visit: Payer: Self-pay | Admitting: Obstetrics and Gynecology

## 2017-08-23 ENCOUNTER — Other Ambulatory Visit: Payer: Self-pay | Admitting: Internal Medicine

## 2017-08-23 DIAGNOSIS — R198 Other specified symptoms and signs involving the digestive system and abdomen: Secondary | ICD-10-CM

## 2017-09-02 ENCOUNTER — Ambulatory Visit
Admission: RE | Admit: 2017-09-02 | Discharge: 2017-09-02 | Disposition: A | Discharge status: Home / Self Care | Payer: BC Managed Care – PPO | Source: Ambulatory Visit | Attending: Internal Medicine | Admitting: Internal Medicine

## 2017-09-02 DIAGNOSIS — R198 Other specified symptoms and signs involving the digestive system and abdomen: Secondary | ICD-10-CM

## 2017-09-02 MED ORDER — IOPAMIDOL (ISOVUE-300) INJECTION 61%
125.0000 mL | Freq: Once | INTRAVENOUS | Status: AC | PRN
  Administered 2017-09-02: 125 mL via INTRAVENOUS

## 2017-11-17 DIAGNOSIS — R0609 Other forms of dyspnea: Secondary | ICD-10-CM | POA: Insufficient documentation

## 2017-11-17 DIAGNOSIS — I517 Cardiomegaly: Secondary | ICD-10-CM | POA: Insufficient documentation

## 2017-11-22 ENCOUNTER — Ambulatory Visit (HOSPITAL_COMMUNITY): Payer: BC Managed Care – PPO | Attending: Cardiovascular Disease

## 2017-11-22 ENCOUNTER — Other Ambulatory Visit (HOSPITAL_COMMUNITY): Payer: Self-pay | Admitting: Internal Medicine

## 2017-11-22 ENCOUNTER — Other Ambulatory Visit: Payer: Self-pay

## 2017-11-22 DIAGNOSIS — R079 Chest pain, unspecified: Secondary | ICD-10-CM | POA: Diagnosis not present

## 2017-11-22 DIAGNOSIS — I517 Cardiomegaly: Secondary | ICD-10-CM | POA: Insufficient documentation

## 2017-11-22 DIAGNOSIS — E785 Hyperlipidemia, unspecified: Secondary | ICD-10-CM | POA: Insufficient documentation

## 2017-11-22 DIAGNOSIS — I10 Essential (primary) hypertension: Secondary | ICD-10-CM | POA: Insufficient documentation

## 2017-12-14 ENCOUNTER — Ambulatory Visit: Payer: BC Managed Care – PPO | Admitting: Interventional Cardiology

## 2017-12-14 ENCOUNTER — Encounter: Payer: Self-pay | Admitting: Interventional Cardiology

## 2017-12-14 ENCOUNTER — Telehealth: Payer: Self-pay

## 2017-12-14 VITALS — BP 126/66 | HR 71 | Ht 66.0 in | Wt 274.2 lb

## 2017-12-14 DIAGNOSIS — I10 Essential (primary) hypertension: Secondary | ICD-10-CM

## 2017-12-14 DIAGNOSIS — E785 Hyperlipidemia, unspecified: Secondary | ICD-10-CM

## 2017-12-14 DIAGNOSIS — E8881 Metabolic syndrome: Secondary | ICD-10-CM | POA: Insufficient documentation

## 2017-12-14 DIAGNOSIS — R011 Cardiac murmur, unspecified: Secondary | ICD-10-CM

## 2017-12-14 DIAGNOSIS — I517 Cardiomegaly: Secondary | ICD-10-CM | POA: Diagnosis not present

## 2017-12-14 NOTE — Patient Instructions (Signed)
Medication Instructions:  Your physician recommends that you continue on your current medications as directed. Please refer to the Current Medication list given to you today.   Labwork: None ordered  Testing/Procedures: Your physician has requested that you have an echocardiogram. Echocardiography is a painless test that uses sound waves to create images of your heart. It provides your doctor with information about the size and shape of your heart and how well your heart's chambers and valves are working. This procedure takes approximately one hour. There are no restrictions for this procedure.  Follow-Up: Your physician wants you to follow-up AS NEEDED with Dr. Katrinka BlazingSmith  Any Other Special Instructions Will Be Listed Below (If Applicable).  Echocardiogram An echocardiogram, or echocardiography, uses sound waves (ultrasound) to produce an image of your heart. The echocardiogram is simple, painless, obtained within a short period of time, and offers valuable information to your health care provider. The images from an echocardiogram can provide information such as:  Evidence of coronary artery disease (CAD).  Heart size.  Heart muscle function.  Heart valve function.  Aneurysm detection.  Evidence of a past heart attack.  Fluid buildup around the heart.  Heart muscle thickening.  Assess heart valve function.  Tell a health care provider about:  Any allergies you have.  All medicines you are taking, including vitamins, herbs, eye drops, creams, and over-the-counter medicines.  Any problems you or family members have had with anesthetic medicines.  Any blood disorders you have.  Any surgeries you have had.  Any medical conditions you have.  Whether you are pregnant or may be pregnant. What happens before the procedure? No special preparation is needed. Eat and drink normally. What happens during the procedure?  In order to produce an image of your heart, gel will be  applied to your chest and a wand-like tool (transducer) will be moved over your chest. The gel will help transmit the sound waves from the transducer. The sound waves will harmlessly bounce off your heart to allow the heart images to be captured in real-time motion. These images will then be recorded.  You may need an IV to receive a medicine that improves the quality of the pictures. What happens after the procedure? You may return to your normal schedule including diet, activities, and medicines, unless your health care provider tells you otherwise. This information is not intended to replace advice given to you by your health care provider. Make sure you discuss any questions you have with your health care provider. Document Released: 03/05/2000 Document Revised: 10/25/2015 Document Reviewed: 11/13/2012 Elsevier Interactive Patient Education  2017 [employer]    If you need a refill on your cardiac medications before your next appointment, please call your pharmacy.

## 2017-12-14 NOTE — Telephone Encounter (Signed)
Called and spoke to patient and made her aware that Dr. Katrinka BlazingSmith reviewed her chart further. Made patient aware that she just had an echo done earlier this month that was ordered by Dr. Nehemiah SettlePolite. Made patient aware that this echo was completely normal and another one is not needed at this time. Patient verbalized understanding and thanked me for the call.

## 2017-12-14 NOTE — Progress Notes (Addendum)
Cardiology Office Note:    Date:  12/14/2017   ID:  Ashley York, DOB November 12, 1955, MRN [id number]  PCP:  Seward Carol, MD  Cardiologist:  No primary care provider on file.   Referring MD: Seward Carol, MD   Chief Complaint  Patient presents with  . Chest Pain  . Follow-up    Cardiac enlargement    History of Present Illness:    Ashley York is a 62 y.o. female with a hx of sleep apnea, hypertension, cardiomegaly, obstructive sleep apnea, hyperlipidemia, and dyspnea on exertion who is referred by Dr. Seward Carol for evaluation of chest discomfort.  She is here for risk assessment and because of recent diagnosis of "cardiomegaly" that emanated from an abdominal CT scan performed because of recurring epigastric discomfort.  She was found to have gallstones.  No specific therapy recommended.  She has a history of obstructive sleep apnea treated with CPAP, obesity, hypertension, and hyperlipidemia.  All of these are being treated and under good control.  She is relatively sedentary.  She has low back discomfort that prevents activity to a certain degree because of leg pain.  Has a history of mitral valve prolapse and heart murmur.   Past Medical History:  Diagnosis Date  . Allergy   . Cardiomegaly   . CHOLELITHIASIS 05/10/2008   Qualifier: Diagnosis of  By: Mat Carne    . CONSTIPATION 04/23/2008   Qualifier: Diagnosis of  By: Henrene Pastor MD, Docia Chuck   . Dyspnea on exertion   . Gallstones   . Heart murmur   . HEMORRHOIDS, INTERNAL 04/19/2008   Qualifier: Diagnosis of  By: Marland Mcalpine    . Hyperlipidemia   . Hypertension   . HYPERTENSION 04/19/2008   Qualifier: Diagnosis of  By: Marland Mcalpine    . Hypothyroidism   . HYPOTHYROIDISM 04/19/2008   Qualifier: Diagnosis of  By: Marland Mcalpine    . Osteoarthritis   . Overweight   . Sleep apnea    has cpap but does not use it    Past Surgical History:  Procedure Laterality Date  . [place]  . KNEE SURGERY Right 2010  . WISDOM TOOTH EXTRACTION  1999    Current Medications: Current Meds  Medication Sig  . amLODipine (NORVASC) 10 MG tablet Take 10 mg by mouth daily.  Marland Kitchen atorvastatin (LIPITOR) 20 MG tablet Take 20 mg by mouth daily.  . B Complex-C (SUPER B COMPLEX PO) Take 1 capsule by mouth daily.  . Cholecalciferol (VITAMIN D3 PO) Take 5,000 Units by mouth daily.  Marland Kitchen CINNAMON PO Take 1 capsule by mouth 2 (two) times daily.  . clobetasol cream (TEMOVATE) 8.93 % Apply 1 application topically 2 (two) times daily as needed (rash).  . Coenzyme Q10 (COQ10) 100 MG CAPS Take 100 mg by mouth daily.  . Diclofenac (ZORVOLEX) 35 MG CAPS Take 35 mg by mouth 2 (two) times daily.   Marland Kitchen levothyroxine (SYNTHROID, LEVOTHROID) 125 MCG tablet Take 125 mcg by mouth daily.  Marland Kitchen liothyronine (CYTOMEL) 5 MCG tablet Take 5 mcg by mouth daily.  Marland Kitchen losartan-hydrochlorothiazide (HYZAAR) 50-12.5 MG per tablet Take 1 tablet by mouth daily.   Marland Kitchen MAGNESIUM PO Take 1,000 mg by mouth daily.  . TURMERIC PO Take 500 mg by mouth daily.     Allergies:   Sulfonamide derivatives   Social History   Socioeconomic History  . Marital status: Married    Spouse name: Not on file  .  Number of children: 2  . Years of education: Not on file  . Highest education level: Master's degree (e.g., MA, MS, MEng, MEd, MSW, MBA)  Occupational History  . Occupation: RETIRED Animal nutritionist  Social Needs  . Financial resource strain: Not on file  . Food insecurity:    Worry: Not on file    Inability: Not on file  . Transportation needs:    Medical: Not on file    Non-medical: Not on file  Tobacco Use  . Smoking status: Former Smoker    Types: Cigarettes    Last attempt to quit: 1979    Years since quitting: 40.7  . Smokeless tobacco: Never Used  Substance and Sexual Activity  . Alcohol use: Yes    Comment: SOCIAL  . Drug use: No  . Sexual activity: Yes    Birth control/protection: None    Comment: pm     Lifestyle  . Physical activity:    Days per week: Not on file    Minutes per session: Not on file  . Stress: Not on file  Relationships  . Social connections:    Talks on phone: Not on file    Gets together: Not on file    Attends religious service: Not on file    Active member of club or organization: Not on file    Attends meetings of clubs or organizations: Not on file    Relationship status: Not on file  Other Topics Concern  . Not on file  Social History Narrative  . Not on file     Family History: The patient's family history includes Breast cancer in her maternal grandmother; Diabetes in her father and mother; Hypertension in her mother; Multiple myeloma in her father; Other in her brother and sister; Sarcoidosis in her mother; Stroke in her maternal grandfather. There is no history of Colon cancer.  ROS:   Please see the history of present illness.    Lower extremity swelling, occasional chest pain, cramping and burning discomfort left lateral leg, low back pain.  All other systems reviewed and are negative.  EKGs/Labs/Other Studies Reviewed:    The following studies were reviewed today:  Abdominal CT scan June 2019 IMPRESSION: 1. Notable cholelithiasis, correlate clinically in assessing for cholecystitis. However, no current pericholecystic fluid is identified. 2. Mild cardiomegaly. 3. Lumbar spondylosis with impingement at L3-4 and L4-5. 4.  No abdominal atherosclerosis noted  2D Doppler echocardiogram 9/33/19: ------------------------------------------------------------------- Study Conclusions  - Left ventricle: The cavity size was normal. Systolic function was   normal. The estimated ejection fraction was in the range of 55%   to 60%. Wall motion was normal; there were no regional wall   motion abnormalities. Left ventricular diastolic function   parameters were normal. - Atrial septum: No defect or patent foramen ovale was identified.  EKG:  EKG is   ordered today.  The ekg performed at [facility] 11/17/2017 reveals sinus rhythm at 63 bpm and normal overall appearance.  Recent Labs: No results found for requested labs within last 8760 hours.  Recent Lipid Panel No results found for: CHOL, TRIG, HDL, CHOLHDL, VLDL, LDLCALC, LDLDIRECT  Physical Exam:    VS:  BP 126/66   Pulse 71   Ht 5' 6" (1.676 m)   Wt 274 lb 3.2 oz (124.4 kg)   BMI 44.26 kg/m     Wt Readings from Last 3 Encounters:  12/14/17 274 lb 3.2 oz (124.4 kg)  05/23/14 265 lb (120.2 kg)  04/16/14 270  lb (122.5 kg)     GEN: Morbidly obese.  Well nourished, well developed in no acute distress HEENT: Normal NECK: No JVD. LYMPHATICS: No lymphadenopathy CARDIAC: RRR, soft 1/6 to 2/6 crescendo decrescendo systolic murmur right upper sternal border.  No apical or left axillary murmur, S4 gallop, no edema. VASCULAR: 2+ and symmetric radial and carotid bilateral pulses.  No bruits. RESPIRATORY:  Clear to auscultation without rales, wheezing or rhonchi  ABDOMEN: Soft, non-tender, non-distended, No pulsatile mass, MUSCULOSKELETAL: No deformity  SKIN: Warm and dry NEUROLOGIC:  Alert and oriented x 3 PSYCHIATRIC:  Normal affect   ASSESSMENT:    1. Cardiomegaly   2. Essential hypertension   3. Systolic murmur   4. Hyperlipidemia LDL goal <70   5. Metabolic syndrome    PLAN:    In order of problems listed above:  1. Diagnosis made by abdominal CT scan.  Subsequently an echocardiogram is been performed and is normal.  There is no evidence of LVH or LV cavity enlargement.  The right ventricle is normal in size.  Therefore, the patient does not have CHF for any significant structural cardiac abnormality.  The echocardiogram with measured heart size and muscle thickness supersedes the ability to diagnose cardiomegaly.  No further work-up is necessary. 2. Blood pressure is excellently controlled on her current regimen.  Discussed low-salt diet.  Discussed moderate aerobic activity  and weight loss.  Target 130/80 mmHg. 3. Has mitral calcification based on the CT scan.  I did not hear a mitral regurgitation murmur.  2D Doppler echocardiogram to assess. 4. LDL target less than 70.  Most recent values were at or near target. 5. Hemoglobin A1c is 6.2.  Needs to lose weight and participate in moderate intensity aerobic activity up to 150 minutes/week.  Discussed risk mitigation going forward to prevent cardiac events.   Medication Adjustments/Labs and Tests Ordered: Current medicines are reviewed at length with the patient today.  Concerns regarding medicines are outlined above.  Orders Placed This Encounter  Procedures  . ECHOCARDIOGRAM COMPLETE   No orders of the defined types were placed in this encounter.   Patient Instructions  Medication Instructions:  Your physician recommends that you continue on your current medications as directed. Please refer to the Current Medication list given to you today.   Labwork: None ordered  Testing/Procedures: Your physician has requested that you have an echocardiogram. Echocardiography is a painless test that uses sound waves to create images of your heart. It provides your doctor with information about the size and shape of your heart and how well your heart's chambers and valves are working. This procedure takes approximately one hour. There are no restrictions for this procedure.  Follow-Up: Your physician wants you to follow-up AS NEEDED with Dr. Tamala Julian  Any Other Special Instructions Will Be Listed Below (If Applicable).  Echocardiogram An echocardiogram, or echocardiography, uses sound waves (ultrasound) to produce an image of your heart. The echocardiogram is simple, painless, obtained within a short period of time, and offers valuable information to your health care provider. The images from an echocardiogram can provide information such as:  Evidence of coronary artery disease (CAD).  Heart size.  Heart muscle  function.  Heart valve function.  Aneurysm detection.  Evidence of a past heart attack.  Fluid buildup around the heart.  Heart muscle thickening.  Assess heart valve function.  Tell a health care provider about:  Any allergies you have.  All medicines you are taking, including vitamins, herbs, eye drops,  creams, and over-the-counter medicines.  Any problems you or family members have had with anesthetic medicines.  Any blood disorders you have.  Any surgeries you have had.  Any medical conditions you have.  Whether you are pregnant or may be pregnant. What happens before the procedure? No special preparation is needed. Eat and drink normally. What happens during the procedure?  In order to produce an image of your heart, gel will be applied to your chest and a wand-like tool (transducer) will be moved over your chest. The gel will help transmit the sound waves from the transducer. The sound waves will harmlessly bounce off your heart to allow the heart images to be captured in real-time motion. These images will then be recorded.  You may need an IV to receive a medicine that improves the quality of the pictures. What happens after the procedure? You may return to your normal schedule including diet, activities, and medicines, unless your health care provider tells you otherwise. This information is not intended to replace advice given to you by your health care provider. Make sure you discuss any questions you have with your health care provider. Document Released: 03/05/2000 Document Revised: 10/25/2015 Document Reviewed: 11/13/2012 Elsevier Interactive Patient Education  2017 [employer].    If you need a refill on your cardiac medications before your next appointment, please call your pharmacy.      Signed, Sinclair Grooms, MD  12/14/2017 11:58 AM    [place]

## 2017-12-16 ENCOUNTER — Other Ambulatory Visit (HOSPITAL_COMMUNITY): Payer: BC Managed Care – PPO

## 2018-01-13 ENCOUNTER — Encounter

## 2018-01-13 ENCOUNTER — Ambulatory Visit: Payer: BC Managed Care – PPO | Admitting: Interventional Cardiology

## 2018-09-26 ENCOUNTER — Other Ambulatory Visit: Payer: Self-pay | Admitting: *Deleted

## 2018-09-26 DIAGNOSIS — Z20822 Contact with and (suspected) exposure to covid-19: Secondary | ICD-10-CM

## 2018-10-02 LAB — NOVEL CORONAVIRUS, NAA: SARS-CoV-2, NAA: NOT DETECTED

## 2019-01-04 ENCOUNTER — Other Ambulatory Visit: Payer: Self-pay | Admitting: Internal Medicine

## 2019-01-04 DIAGNOSIS — Z1231 Encounter for screening mammogram for malignant neoplasm of breast: Secondary | ICD-10-CM

## 2019-01-11 ENCOUNTER — Other Ambulatory Visit: Payer: Self-pay

## 2019-01-11 ENCOUNTER — Ambulatory Visit
Admission: RE | Admit: 2019-01-11 | Discharge: 2019-01-11 | Disposition: A | Discharge status: Home / Self Care | Payer: BC Managed Care – PPO | Source: Ambulatory Visit | Attending: Internal Medicine | Admitting: Internal Medicine

## 2019-01-11 DIAGNOSIS — Z1231 Encounter for screening mammogram for malignant neoplasm of breast: Secondary | ICD-10-CM

## 2019-02-27 ENCOUNTER — Ambulatory Visit
Admission: RE | Admit: 2019-02-27 | Discharge: 2019-02-27 | Disposition: A | Discharge status: Home / Self Care | Payer: BC Managed Care – PPO | Source: Ambulatory Visit | Attending: Internal Medicine | Admitting: Internal Medicine

## 2019-02-27 ENCOUNTER — Other Ambulatory Visit: Payer: Self-pay | Admitting: Internal Medicine

## 2019-02-27 DIAGNOSIS — R140 Abdominal distension (gaseous): Secondary | ICD-10-CM

## 2019-05-09 ENCOUNTER — Encounter: Payer: Self-pay | Admitting: Internal Medicine

## 2019-05-09 ENCOUNTER — Other Ambulatory Visit: Payer: Self-pay

## 2019-05-09 ENCOUNTER — Ambulatory Visit: Payer: BC Managed Care – PPO | Admitting: Internal Medicine

## 2019-05-09 VITALS — BP 116/62 | HR 68 | Temp 98.2°F | Wt 274.6 lb

## 2019-05-09 DIAGNOSIS — Z01818 Encounter for other preprocedural examination: Secondary | ICD-10-CM

## 2019-05-09 DIAGNOSIS — K8020 Calculus of gallbladder without cholecystitis without obstruction: Secondary | ICD-10-CM | POA: Diagnosis not present

## 2019-05-09 DIAGNOSIS — R1013 Epigastric pain: Secondary | ICD-10-CM | POA: Diagnosis not present

## 2019-05-09 NOTE — Patient Instructions (Signed)
You have been scheduled for an endoscopy. Please follow written instructions given to you at your visit today. If you use inhalers (even only as needed), please bring them with you on the day of your procedure.   

## 2019-05-09 NOTE — Progress Notes (Signed)
HISTORY OF PRESENT ILLNESS:  Ashley York is a 64 y.o. female, retired high school and middle school counselor, with hypertension, hypothyroidism, and morbid obesity who presents today regarding chronic problems with upper abdominal and epigastric discomfort.  The patient was last seen in this office January 2015 when she underwent surveillance colonoscopy.  Examination was normal.  Follow-up in 10 years recommended.  Patient tells me that she has had problems with squeezing upper abdominal discomfort (she does not call this pain) for several years.  For this complaint she underwent CT scan of the abdomen and pelvis with contrast in June 2019.  Examination was notable for cholelithiasis.  Subsequent plain films of the abdomen December 2020 regarding the same revealed possible mild constipation but was otherwise unremarkable.  Patient tells me that the discomfort occurs most days.  Generally in the evenings.  Typically lasts anywhere from 10 minutes to 30 minutes.  There is associated nausea but no vomiting.  This has not awoken her from sleep.  No weight loss.  She denies reflux symptoms.  No prior upper endoscopy.  Generally has daily bowel movements though occasionally will skip a day.  This is not any change from her baseline.  No new medications.  REVIEW OF SYSTEMS:  All non-GI ROS negative unless otherwise stated in the HPI except for allergies  Past Medical History:  Diagnosis Date  . Allergy   . Cardiomegaly   . CHOLELITHIASIS 05/10/2008   Qualifier: Diagnosis of  By: Mat Carne    . CONSTIPATION 04/23/2008   Qualifier: Diagnosis of  By: Henrene Pastor MD, Docia Chuck   . Dyspnea on exertion   . Gallstones   . Heart murmur   . HEMORRHOIDS, INTERNAL 04/19/2008   Qualifier: Diagnosis of  By: Marland Mcalpine    . Hyperlipidemia   . Hypertension   . HYPERTENSION 04/19/2008   Qualifier: Diagnosis of  By: Marland Mcalpine    . Hypothyroidism   . HYPOTHYROIDISM 04/19/2008   Qualifier:  Diagnosis of  By: Marland Mcalpine    . Osteoarthritis   . Overweight   . Sleep apnea    has cpap but does not use it    Past Surgical History:  Procedure Laterality Date  . [place]  . KNEE SURGERY Right 2010  . [place]    Social History Ashley York  reports that she quit smoking about 42 years ago. Her smoking use included cigarettes. She has never used smokeless tobacco. She reports current alcohol use. She reports that she does not use drugs.  family history includes Breast cancer in her maternal grandmother; Diabetes in her father and mother; Hypertension in her mother; Multiple myeloma in her father; Other in her brother and sister; Sarcoidosis in her mother; Stroke in her maternal grandfather.  Allergies  Allergen Reactions  . Sulfonamide Derivatives     REACTION: Rash       PHYSICAL EXAMINATION: Vital signs: BP 116/62   Pulse 68   Temp 98.2 F (36.8 C)   Wt 274 lb 9.6 oz (124.6 kg)   BMI 44.32 kg/m   Constitutional: Pleasant, generally well-appearing, no acute distress.  Obese Psychiatric: alert and oriented x3, cooperative Eyes: extraocular movements intact, anicteric, conjunctiva pink Mouth: oral pharynx moist, no lesions Neck: supple no lymphadenopathy Cardiovascular: heart regular rate and rhythm, 2/6 systolic murmur Lungs: clear to auscultation bilaterally Abdomen: soft, obese, nontender, nondistended, no obvious ascites, no peritoneal signs, normal bowel  sounds, no organomegaly Rectal: Omitted Extremities: no clubbing, cyanosis, or lower extremity edema bilaterally Skin: no lesions on visible extremities Neuro: No focal deficits.  Cranial nerves intact  ASSESSMENT:  1.  Chronic squeezing upper abdominal discomfort.  I suspect this is related to known gallstones.  Rule out upper GI mucosal lesions or abnormalities. 2.  History of diminutive adenoma 2009.  Surveillance examination 2015 was normal 3.   Morbid obesity   PLAN:  1.  Schedule upper endoscopy to further evaluate upper abdominal pain.  The patient is HIGH RISK given her body habitus.  The examination will be performed with monitored anesthesia care.The nature of the procedure, as well as the risks, benefits, and alternatives were carefully and thoroughly reviewed with the patient. Ample time for discussion and questions allowed. The patient understood, was satisfied, and agreed to proceed. 2.  If upper endoscopy unrevealing, then general surgical referral for a surgical opinion and consideration of laparoscopic cholecystectomy. 3.  Exercise and weight loss 4.  Routine surveillance colonoscopy due around January 2025 A total time of 60 minutes was spent preparing to see the patient, reviewing outside tests and x-rays, obtaining a comprehensive history, performing medically appropriate physical examination, counseling the patient regarding her above listed issues, ordering and reviewing her procedure, and documenting clinical information in the health record

## 2019-05-10 ENCOUNTER — Encounter: Payer: Self-pay | Admitting: Internal Medicine

## 2019-05-14 ENCOUNTER — Ambulatory Visit (INDEPENDENT_AMBULATORY_CARE_PROVIDER_SITE_OTHER): Payer: BC Managed Care – PPO

## 2019-05-14 ENCOUNTER — Other Ambulatory Visit: Payer: Self-pay

## 2019-05-14 DIAGNOSIS — Z1159 Encounter for screening for other viral diseases: Secondary | ICD-10-CM

## 2019-05-15 LAB — SARS CORONAVIRUS 2 (TAT 6-24 HRS): SARS Coronavirus 2: NEGATIVE

## 2019-05-16 ENCOUNTER — Encounter: Payer: Self-pay | Admitting: Internal Medicine

## 2019-05-16 ENCOUNTER — Other Ambulatory Visit: Payer: Self-pay

## 2019-05-16 ENCOUNTER — Ambulatory Visit (AMBULATORY_SURGERY_CENTER): Payer: BC Managed Care – PPO | Admitting: Internal Medicine

## 2019-05-16 VITALS — BP 114/56 | HR 70 | Temp 96.9°F | Resp 14 | Ht 65.5 in | Wt 274.0 lb

## 2019-05-16 DIAGNOSIS — R1013 Epigastric pain: Secondary | ICD-10-CM

## 2019-05-16 DIAGNOSIS — K2950 Unspecified chronic gastritis without bleeding: Secondary | ICD-10-CM

## 2019-05-16 MED ORDER — PANTOPRAZOLE SODIUM 40 MG PO TBEC: 40.0000 mg | DELAYED_RELEASE_TABLET | Freq: Every day | ORAL | 6 refills | Status: DC

## 2019-05-16 MED ORDER — SODIUM CHLORIDE 0.9 % IV SOLN: 500.0000 mL | Freq: Once | INTRAVENOUS | Status: DC

## 2019-05-16 NOTE — Progress Notes (Signed)
Pt's states no medical or surgical changes since previsit or office visit.  JB - temp KA - vitals 

## 2019-05-16 NOTE — Patient Instructions (Signed)
YOU HAD AN ENDOSCOPIC PROCEDURE TODAY AT THE [facility]:   Refer to the procedure report that was given to you for any specific questions about what was found during the examination.  If the procedure report does not answer your questions, please call your gastroenterologist to clarify.  If you requested that your care partner not be given the details of your procedure findings, then the procedure report has been included in a sealed envelope for you to review at your convenience later.  YOU SHOULD EXPECT: Some feelings of bloating in the abdomen. Passage of more gas than usual.  Walking can help get rid of the air that was put into your GI tract during the procedure and reduce the bloating. If you had a lower endoscopy (such as a colonoscopy or flexible sigmoidoscopy) you may notice spotting of blood in your stool or on the toilet paper. If you underwent a bowel prep for your procedure, you may not have a normal bowel movement for a few days.  Please Note:  You might notice some irritation and congestion in your nose or some drainage.  This is from the oxygen used during your procedure.  There is no need for concern and it should clear up in a day or so.  SYMPTOMS TO REPORT IMMEDIATELY:     Following upper endoscopy (EGD)  Vomiting of blood or coffee ground material  New chest pain or pain under the shoulder blades  Painful or persistently difficult swallowing  New shortness of breath  Fever of 100F or higher  Black, tarry-looking stools  For urgent or emergent issues, a gastroenterologist can be reached at any hour by calling [phone].   DIET:  We do recommend a small meal at first, but then you may proceed to your regular diet.  Drink plenty of fluids but you should avoid alcoholic beverages for 24 hours.  ACTIVITY:  You should plan to take it easy for the rest of today and you should NOT DRIVE or use heavy machinery until tomorrow (because of the sedation medicines  used during the test).    FOLLOW UP: Our staff will call the number listed on your records 48-72 hours following your procedure to check on you and address any questions or concerns that you may have regarding the information given to you following your procedure. If we do not reach you, we will leave a message.  We will attempt to reach you two times.  During this call, we will ask if you have developed any symptoms of COVID 19. If you develop any symptoms (ie: fever, flu-like symptoms, shortness of breath, cough etc.) before then, please call [phone].  If you test positive for Covid 19 in the 2 weeks post procedure, please call and report this information to [place].    If any biopsies were taken you will be contacted by phone or by letter within the next 1-3 weeks.  Please call us at [phone] if you have not heard about the biopsies in 3 weeks.    SIGNATURES/CONFIDENTIALITY: You and/or your care partner have signed paperwork which will be entered into your electronic medical record.  These signatures attest to the fact that that the information above on your After Visit Summary has been reviewed and is understood.  Full responsibility of the confidentiality of this discharge information lies with you and/or your care-partner.   Start pantoprazole 40 mg,resume remainder of medications. Follow up with Dr. Marina Goodell in 6 weeks.

## 2019-05-16 NOTE — Progress Notes (Signed)
PT taken to PACU. Monitors in place. VSS. Report given to RN. 

## 2019-05-16 NOTE — Op Note (Signed)
[facility] Patient Name: Ashley York Procedure Date: 05/16/2019 8:19 AM MRN: [id number] Endoscopist: Docia Chuck. Henrene Pastor , MD Age: 64 Referring MD:  Date of Birth: 13-Apr-1955 Gender: Female Account #: [id number] Procedure:                Upper GI endoscopy with biopsies Indications:              Epigastric abdominal pain, Dyspepsia Medicines:                Monitored Anesthesia Care Procedure:                Pre-Anesthesia Assessment:                           - Prior to the procedure, a History and Physical                            was performed, and patient medications and                            allergies were reviewed. The patient's tolerance of                            previous anesthesia was also reviewed. The risks                            and benefits of the procedure and the sedation                            options and risks were discussed with the patient.                            All questions were answered, and informed consent                            was obtained. Prior Anticoagulants: The patient has                            taken no previous anticoagulant or antiplatelet                            agents. ASA Grade Assessment: II - A patient with                            mild systemic disease. After reviewing the risks                            and benefits, the patient was deemed in                            satisfactory condition to undergo the procedure.                           After obtaining informed consent, the endoscope was  passed under direct vision. Throughout the                            procedure, the patient's blood pressure, pulse, and                            oxygen saturations were monitored continuously. The                            Endoscope was introduced through the mouth, and                            advanced to the second part of duodenum. The upper                            GI  endoscopy was accomplished without difficulty.                            The patient tolerated the procedure well. Scope In: Scope Out: Findings:                 The esophagus was normal.                           The stomach revealed diffusely atrophic mucosa. In                            the antrum there was erythematous and slightly                            friable mucosa. Biopsies were taken with a cold                            forceps for Helicobacter pylori testing using                            CLOtest.                           The examined duodenum was normal.                           The cardia and gastric fundus were normal on                            retroflexion. Complications:            No immediate complications. Estimated Blood Loss:     Estimated blood loss: none. Impression:               - Normal esophagus.                           - Atrophic gastric mucosa and mild antral                            gastritis. Biopsied.                           -  Normal examined duodenum. Recommendation:           - Patient has a contact number available for                            emergencies. The signs and symptoms of potential                            delayed complications were discussed with the                            patient. Return to normal activities tomorrow.                            Written discharge instructions were provided to the                            patient.                           - Resume previous diet.                           - Continue present medications.                           - Await pathology results.                           - PRESCRIBE pantoprazole 40 mg daily; #30; 6 refills                           - OFFICE FOLLOW-up with Dr. Marina Goodell in about 6 weeks                           - IF symptoms persist or worsen, then general                            surgical consultation regarding laparoscopic                             cholecystectomy for likely symptomatic                            cholelithiasis Cammeron Greis N. Marina Goodell, MD 05/16/2019 8:40:20 AM This report has been signed electronically.

## 2019-05-17 LAB — HELICOBACTER PYLORI SCREEN-BIOPSY: UREASE: NEGATIVE

## 2019-05-18 ENCOUNTER — Telehealth: Payer: Self-pay

## 2019-05-18 NOTE — Telephone Encounter (Signed)
  Follow up Call-  Call back number 05/16/2019  Post procedure Call Back phone  # [phone]  Permission to leave phone message Yes  Some recent data might be hidden     Patient questions:  Do you have a fever, pain , or abdominal swelling? No. Pain Score  0 *  Have you tolerated food without any problems? Yes.    Have you been able to return to your normal activities? Yes.    Do you have any questions about your discharge instructions: Diet   No. Medications  No. Follow up visit  No.  Do you have questions or concerns about your Care? No.  Actions: * If pain score is 4 or above: No action needed, pain <4.  1. Have you developed a fever since your procedure? no  2.   Have you had an respiratory symptoms (SOB or cough) since your procedure? no  3.   Have you tested positive for COVID 19 since your procedure no  4.   Have you had any family members/close contacts diagnosed with the COVID 19 since your procedure?  no   If yes to any of these questions please route to Laverna Peace, RN and Jennye Boroughs, Charity fundraiser.

## 2019-07-04 ENCOUNTER — Encounter: Payer: Self-pay | Admitting: Internal Medicine

## 2019-07-04 ENCOUNTER — Ambulatory Visit: Payer: BC Managed Care – PPO | Admitting: Internal Medicine

## 2019-07-04 VITALS — BP 126/64 | HR 68 | Temp 96.9°F | Ht 66.0 in | Wt 270.8 lb

## 2019-07-04 DIAGNOSIS — R1013 Epigastric pain: Secondary | ICD-10-CM | POA: Diagnosis not present

## 2019-07-04 DIAGNOSIS — K8020 Calculus of gallbladder without cholecystitis without obstruction: Secondary | ICD-10-CM

## 2019-07-04 DIAGNOSIS — K2990 Gastroduodenitis, unspecified, without bleeding: Secondary | ICD-10-CM

## 2019-07-04 DIAGNOSIS — K2970 Gastritis, unspecified, without bleeding: Secondary | ICD-10-CM | POA: Diagnosis not present

## 2019-07-04 DIAGNOSIS — R140 Abdominal distension (gaseous): Secondary | ICD-10-CM

## 2019-07-04 MED ORDER — PANTOPRAZOLE SODIUM 40 MG PO TBEC: 40.0000 mg | DELAYED_RELEASE_TABLET | Freq: Every day | ORAL | 3 refills | Status: DC

## 2019-07-04 NOTE — Progress Notes (Signed)
HISTORY OF PRESENT ILLNESS:  Ashley York is a pleasant 64 y.o. female, retired high school and middle school counselor, with past medical history as listed below who was evaluated May 09, 2019 regarding upper abdominal and epigastric discomfort.  See that dictation for details.  She is known to have gallstones and was felt to likely have biliary colic.  Upper endoscopy was performed and revealed evidence of gastritis.  Testing for Helicobacter pylori was negative.  She was placed on pantoprazole 40 mg daily.  She also has a history of adenomatous colon polyps with up-to-date surveillance.  Follow-up at this time advised.  Patient tells me that she is feeling better since her endoscopy.  She describes 4 episodes of upper abdominal discomfort lasting about 40 seconds.  She does not describe this as pain.  No new complaints.  She does have problems with chronic bloating and gas.  Probiotics have not been helpful.  REVIEW OF SYSTEMS:  All non-GI ROS negative unless otherwise stated in the HPI except for allergies  Past Medical History:  Diagnosis Date  . Allergy   . Cardiomegaly   . CHOLELITHIASIS 05/10/2008   Qualifier: Diagnosis of  By: Mat Carne    . CONSTIPATION 04/23/2008   Qualifier: Diagnosis of  By: Henrene Pastor MD, Docia Chuck   . Dyspnea on exertion   . Gallstones   . Heart murmur   . HEMORRHOIDS, INTERNAL 04/19/2008   Qualifier: Diagnosis of  By: Marland Mcalpine    . Hyperlipidemia   . Hypertension   . HYPERTENSION 04/19/2008   Qualifier: Diagnosis of  By: Marland Mcalpine    . Hypothyroidism   . HYPOTHYROIDISM 04/19/2008   Qualifier: Diagnosis of  By: Marland Mcalpine    . Osteoarthritis   . Overweight   . Sleep apnea    has cpap but does not use it    Past Surgical History:  Procedure Laterality Date  . [place]  . KNEE SURGERY Right 2010  . [place]    Social History Ashley York  reports that she quit smoking  about 42 years ago. Her smoking use included cigarettes. She has never used smokeless tobacco. She reports current alcohol use. She reports that she does not use drugs.  family history includes Breast cancer in her maternal grandmother; Diabetes in her father and mother; Hypertension in her mother; Multiple myeloma in her father; Other in her brother and sister; Sarcoidosis in her mother; Stroke in her maternal grandfather.  Allergies  Allergen Reactions  . Sulfonamide Derivatives     REACTION: Rash       PHYSICAL EXAMINATION: Vital signs: BP 126/64   Pulse 68   Temp (!) 96.9 F (36.1 C)   Ht '5\' 6"'  (1.676 m)   Wt 270 lb 12.8 oz (122.8 kg)   BMI 43.71 kg/m   Constitutional: generally well-appearing, no acute distress Psychiatric: alert and oriented x3, cooperative Eyes: extraocular movements intact, anicteric, conjunctiva pink Mouth: oral pharynx moist, no lesions Neck: supple no lymphadenopathy Cardiovascular: heart regular rate and rhythm, no murmur Lungs: clear to auscultation bilaterally Abdomen: soft, obese, nontender, nondistended, no obvious ascites, no peritoneal signs, normal bowel sounds, no organomegaly Rectal: Omitted Extremities: no clubbing, cyanosis, or lower extremity edema bilaterally Skin: no lesions on visible extremities Neuro: No focal deficits.   ASSESSMENT:  1.  Epigastric discomfort.  Story sounds less convincing for gallstones at this time. 2.  Helicobacter pylori negative gastritis.  On PPI 3.  Cholelithiasis 4.  Morbid obesity 5.  Bloating and increased intestinal gas 6.  History of diminutive adenoma 2009.  Normal surveillance examination 2015  PLAN:  1.  Continue pantoprazole.  I would continue for total of 6 months since initiation.  She should stop at that time to see if there is an exacerbation of symptoms.  Prescription refilled.  Medication risks reviewed 2.  Discussion on intestinal gas 3.  Literature on intestinal gas as well as  antigas and flatulence dietary sheet to review 4.  Contact the office in the interim for any questions or problems.  Should symptoms begin to sound more convincing for biliary colic, then obtain a surgical opinion 5.  Surveillance colonoscopy around 2025 6.  Exercise and weight loss

## 2019-08-07 ENCOUNTER — Other Ambulatory Visit: Payer: Self-pay

## 2019-08-07 ENCOUNTER — Encounter: Payer: Self-pay | Admitting: Orthopaedic Surgery

## 2019-08-07 ENCOUNTER — Telehealth: Payer: Self-pay

## 2019-08-07 ENCOUNTER — Ambulatory Visit: Payer: Self-pay

## 2019-08-07 ENCOUNTER — Ambulatory Visit: Payer: BC Managed Care – PPO | Admitting: Orthopaedic Surgery

## 2019-08-07 VITALS — Ht 65.5 in | Wt 270.0 lb

## 2019-08-07 DIAGNOSIS — M1712 Unilateral primary osteoarthritis, left knee: Secondary | ICD-10-CM

## 2019-08-07 DIAGNOSIS — M25562 Pain in left knee: Secondary | ICD-10-CM

## 2019-08-07 DIAGNOSIS — M1711 Unilateral primary osteoarthritis, right knee: Secondary | ICD-10-CM | POA: Diagnosis not present

## 2019-08-07 DIAGNOSIS — M25561 Pain in right knee: Secondary | ICD-10-CM

## 2019-08-07 DIAGNOSIS — G8929 Other chronic pain: Secondary | ICD-10-CM

## 2019-08-07 NOTE — Progress Notes (Signed)
Office Visit Note   Patient: Ashley York           Date of Birth: 1955/10/01           MRN: [id number] Visit Date: 08/07/2019              Requested by: Seward Carol, MD [address] [place],  [place] [address] PCP: Seward Carol, MD   Assessment & Plan: Visit Diagnoses:  1. Chronic pain of left knee   2. Chronic pain of right knee   3. Unilateral primary osteoarthritis, left knee   4. Unilateral primary osteoarthritis, right knee   5. Severe obesity (BMI >= 40) (HCC)     Plan: I talked with her in detail about treatment options.  She understands that she does need to get her weight lower because this will certainly make surgery easier on her and also surgeons in order to get as good alignment of her knees.  There is not a large soft tissue envelope about either knee so I am comfortable with proceeding with knee replacement surgery for when she is ready.  I talked about what knee replacement surgery involves.  I also talked about at least trying hyaluronic acid for her knees since the steroid injections no longer work and she would like to try a conservative treatment plan such as this prior to consider knee replacement surgery.  I agree with this treatment plan.  All questions and concerns were answered and addressed.  I gave her handout on hyaluronic acid we will order this for her knees.The patient meets the AMA guidelines for Morbid (severe) obesity with a BMI > 40.0 and I have recommended weight loss.  Follow-Up Instructions: Return in about 3 weeks (around 08/28/2019).   Orders:  Orders Placed This Encounter  Procedures  . XR Knee 1-2 Views Left  . XR Knee 1-2 Views Right   No orders of the defined types were placed in this encounter.     Procedures: No procedures performed   Clinical Data: No additional findings.   Subjective: Chief Complaint  Patient presents with  . Left Knee - Pain  . Right Knee - Pain  The patient is a very pleasant  64 year old active female who comes in for evaluation treatment of bilateral knee pain.  She is actually referred from Dr. Neomia Dear who has placed steroid injections numerous times in both her knees with the most recent being in March.  This is gotten to where this is not helping her anymore.  The injections used to help but now they do not.  She has global knee pain in both knees.  They have become to where they are having a constant ache throughout the day and at night.  She denies any injury to the knees.  She does have a remote history of a right knee arthroscopy with a partial medial meniscectomy.  She is not a diabetic.  She is morbidly obese with a BMI of 44.25.  She denies any acute change in medical status.  Her bilateral knee pain has detrimentally affected her mobility, her quality of life and her actives daily living.  She is looking for other treatment options.  HPI  Review of Systems She currently denies any headache, chest pain, shortness of breath, fever, chills, nausea, vomiting  Objective: Vital Signs: Ht 5' 5.5" (1.664 m)   Wt 270 lb (122.5 kg)   BMI 44.25 kg/m   Physical Exam She is alert and  orient x3 and in no acute distress Ortho Exam Examination of both knees show that she has just a slight flexion contracture of both knees.  There is medial joint line tenderness and slight varus malalignment of each knee.  She has significant patellofemoral crepitation and global pain throughout the arc of motion.  Both knees are ligamentously stable. Specialty Comments:  No specialty comments available.  Imaging: XR Knee 1-2 Views Left  Result Date: 08/07/2019 2 views of the left knee show complete bone-on-bone wear in the medial compartment.  There is osteophytes in all 3 compartments with severe patellofemoral arthritic changes.  There is varus malalignment.  XR Knee 1-2 Views Right  Result Date: 08/07/2019 2 views of the right knee show varus malalignment with  significant medial joint space narrowing consistent with severe osteoarthritis of the knee.  There are osteophytes in all 3 compartments with severe patellofemoral disease.    PMFS History: Patient Active Problem List   Diagnosis Date Noted  . Metabolic syndrome 18/29/9371  . Dyspnea on exertion 11/17/2017  . Cardiomegaly 11/17/2017  . CHOLELITHIASIS 05/10/2008  . CONSTIPATION 04/23/2008  . NAUSEA 04/23/2008  . FLATULENCE 04/23/2008  . HYPOTHYROIDISM 04/19/2008  . Essential hypertension 04/19/2008  . HEMORRHOIDS, INTERNAL 04/19/2008   Past Medical History:  Diagnosis Date  . Allergy   . Cardiomegaly   . CHOLELITHIASIS 05/10/2008   Qualifier: Diagnosis of  By: Mat Carne    . CONSTIPATION 04/23/2008   Qualifier: Diagnosis of  By: Henrene Pastor MD, Docia Chuck   . Dyspnea on exertion   . Gallstones   . Heart murmur   . HEMORRHOIDS, INTERNAL 04/19/2008   Qualifier: Diagnosis of  By: Marland Mcalpine    . Hyperlipidemia   . Hypertension   . HYPERTENSION 04/19/2008   Qualifier: Diagnosis of  By: Marland Mcalpine    . Hypothyroidism   . HYPOTHYROIDISM 04/19/2008   Qualifier: Diagnosis of  By: Marland Mcalpine    . Osteoarthritis   . Overweight   . Sleep apnea    has cpap but does not use it    Family History  Problem Relation Age of Onset  . Diabetes Mother   . Sarcoidosis Mother   . Hypertension Mother   . Diabetes Father   . Multiple myeloma Father   . Breast cancer Maternal Grandmother   . Stroke Maternal Grandfather   . Other Sister        GUN SHOT WOUND  . Other Brother        GUN SHOT WOUND  . Colon cancer Neg Hx   . Colon polyps Neg Hx   . Esophageal cancer Neg Hx   . Stomach cancer Neg Hx   . Rectal cancer Neg Hx     Past Surgical History:  Procedure Laterality Date  . [place]  . KNEE SURGERY Right 2010  . [place] EXTRACTION  1999   Social History   Occupational History  . Occupation: RETIRED SCHOOL COUNSELOR  Tobacco Use   . Smoking status: Former Smoker    Types: Cigarettes    Quit date: 1979    Years since quitting: 42.4  . Smokeless tobacco: Never Used  Substance and Sexual Activity  . Alcohol use: Yes    Comment: SOCIAL  . Drug use: No  . Sexual activity: Yes    Birth control/protection: None    Comment: pm

## 2019-08-07 NOTE — Telephone Encounter (Signed)
Bilateral knee gel injections 

## 2019-08-10 ENCOUNTER — Telehealth: Payer: Self-pay

## 2019-08-10 NOTE — Telephone Encounter (Signed)
Noted  

## 2019-08-10 NOTE — Telephone Encounter (Signed)
Submitted VOB for SynviscOne, bilateral knee. 

## 2019-08-15 ENCOUNTER — Telehealth: Payer: Self-pay

## 2019-08-15 NOTE — Telephone Encounter (Signed)
PA required for SynviscOne, bilateral knee. Faxed completed PA form to [employer] at [phone]. 

## 2019-08-24 ENCOUNTER — Telehealth: Payer: Self-pay

## 2019-08-24 NOTE — Telephone Encounter (Signed)
Approved, SynviscOne, bilateral knee. Buy & Bill Covered at 100% after Co-pay. Co-pay of $160.00 required PA Required PA Approval# [id number] Valid 08/21/2019- 02/17/2020

## 2019-08-28 ENCOUNTER — Other Ambulatory Visit: Payer: Self-pay

## 2019-08-28 ENCOUNTER — Ambulatory Visit: Payer: BC Managed Care – PPO | Admitting: Orthopaedic Surgery

## 2019-08-28 ENCOUNTER — Encounter: Payer: Self-pay | Admitting: Orthopaedic Surgery

## 2019-08-28 DIAGNOSIS — M1712 Unilateral primary osteoarthritis, left knee: Secondary | ICD-10-CM

## 2019-08-28 DIAGNOSIS — M1711 Unilateral primary osteoarthritis, right knee: Secondary | ICD-10-CM | POA: Diagnosis not present

## 2019-08-28 MED ORDER — HYLAN G-F 20 48 MG/6ML IX SOSY
48.0000 mg | PREFILLED_SYRINGE | INTRA_ARTICULAR | Status: AC | PRN
  Administered 2019-08-28: 48 mg via INTRA_ARTICULAR

## 2019-08-28 NOTE — Progress Notes (Signed)
   Procedure Note  Patient: Ashley York             Date of Birth: Jul 10, 1955           MRN: [id number]             Visit Date: 08/28/2019  Procedures: Visit Diagnoses:  1. Unilateral primary osteoarthritis, left knee   2. Unilateral primary osteoarthritis, right knee     Large Joint Inj: R knee on 08/28/2019 11:14 AM Indications: pain and diagnostic evaluation Details: 22 G 1.5 in needle, superolateral approach  Arthrogram: No  Medications: 48 mg Hylan 48 MG/6ML Outcome: tolerated well, no immediate complications Procedure, treatment alternatives, risks and benefits explained, specific risks discussed. Consent was given by the patient. Immediately prior to procedure a time out was called to verify the correct patient, procedure, equipment, support staff and site/side marked as required. Patient was prepped and draped in the usual sterile fashion.   Large Joint Inj: L knee on 08/28/2019 11:14 AM Indications: pain and diagnostic evaluation Details: 22 G 1.5 in needle, superolateral approach  Arthrogram: No  Medications: 48 mg Hylan 48 MG/6ML Outcome: tolerated well, no immediate complications Procedure, treatment alternatives, risks and benefits explained, specific risks discussed. Consent was given by the patient. Immediately prior to procedure a time out was called to verify the correct patient, procedure, equipment, support staff and site/side marked as required. Patient was prepped and draped in the usual sterile fashion.    Patient comes in today for scheduled hyaluronic acid injections in both knees to treat the pain from osteoarthritis.  She has tried and failed other conservative treatment measures including activity modification and steroid injections.  She is someone who is morbidly obese.  Examination of both knees shows slight varus malalignment.  Both knees have good range of motion with no effusion but global tenderness.  I did place Synvisc 1 in both knees without  difficulty.  All questions and concerns were answered and addressed.  Follow-up will be as needed.

## 2019-12-12 ENCOUNTER — Other Ambulatory Visit: Payer: Self-pay | Admitting: Internal Medicine

## 2019-12-12 DIAGNOSIS — Z1231 Encounter for screening mammogram for malignant neoplasm of breast: Secondary | ICD-10-CM

## 2020-01-09 ENCOUNTER — Telehealth: Payer: Self-pay | Admitting: Orthopaedic Surgery

## 2020-01-09 NOTE — Telephone Encounter (Signed)
Called and advised patient CD is ready for pick up

## 2020-01-09 NOTE — Telephone Encounter (Signed)
Please copy knee xrays to CD. Pt advised to sign release when comes to pick up. [phone]

## 2020-01-14 ENCOUNTER — Other Ambulatory Visit: Payer: Self-pay | Admitting: Internal Medicine

## 2020-01-14 DIAGNOSIS — N644 Mastodynia: Secondary | ICD-10-CM

## 2020-01-14 DIAGNOSIS — N6489 Other specified disorders of breast: Secondary | ICD-10-CM

## 2020-01-17 ENCOUNTER — Ambulatory Visit: Payer: BC Managed Care – PPO

## 2020-01-17 ENCOUNTER — Other Ambulatory Visit: Payer: Self-pay

## 2020-01-17 ENCOUNTER — Ambulatory Visit
Admission: RE | Admit: 2020-01-17 | Discharge: 2020-01-17 | Disposition: A | Discharge status: Home / Self Care | Payer: BC Managed Care – PPO | Source: Ambulatory Visit | Attending: Internal Medicine | Admitting: Internal Medicine

## 2020-01-17 DIAGNOSIS — N644 Mastodynia: Secondary | ICD-10-CM

## 2020-01-17 DIAGNOSIS — N6489 Other specified disorders of breast: Secondary | ICD-10-CM

## 2020-03-26 ENCOUNTER — Other Ambulatory Visit: Payer: Self-pay | Admitting: Obstetrics and Gynecology

## 2020-03-26 DIAGNOSIS — E2839 Other primary ovarian failure: Secondary | ICD-10-CM

## 2020-04-25 DIAGNOSIS — M170 Bilateral primary osteoarthritis of knee: Secondary | ICD-10-CM | POA: Diagnosis not present

## 2020-06-24 DIAGNOSIS — G4730 Sleep apnea, unspecified: Secondary | ICD-10-CM | POA: Diagnosis not present

## 2020-06-24 DIAGNOSIS — R7309 Other abnormal glucose: Secondary | ICD-10-CM | POA: Diagnosis not present

## 2020-06-24 DIAGNOSIS — I10 Essential (primary) hypertension: Secondary | ICD-10-CM | POA: Diagnosis not present

## 2020-06-24 DIAGNOSIS — E039 Hypothyroidism, unspecified: Secondary | ICD-10-CM | POA: Diagnosis not present

## 2020-06-24 DIAGNOSIS — E7800 Pure hypercholesterolemia, unspecified: Secondary | ICD-10-CM | POA: Diagnosis not present

## 2020-07-10 ENCOUNTER — Other Ambulatory Visit: Payer: BC Managed Care – PPO

## 2020-07-25 ENCOUNTER — Encounter (HOSPITAL_COMMUNITY): Payer: BC Managed Care – PPO

## 2020-09-12 DIAGNOSIS — G4733 Obstructive sleep apnea (adult) (pediatric): Secondary | ICD-10-CM | POA: Diagnosis not present

## 2020-10-02 ENCOUNTER — Other Ambulatory Visit (HOSPITAL_COMMUNITY): Payer: BC Managed Care – PPO

## 2020-10-18 DIAGNOSIS — Z23 Encounter for immunization: Secondary | ICD-10-CM | POA: Diagnosis not present

## 2020-11-07 ENCOUNTER — Other Ambulatory Visit: Payer: Self-pay | Admitting: Physician Assistant

## 2020-11-07 ENCOUNTER — Ambulatory Visit
Admission: RE | Admit: 2020-11-07 | Discharge: 2020-11-07 | Disposition: A | Discharge status: Home / Self Care | Payer: BC Managed Care – PPO | Source: Ambulatory Visit | Attending: Physician Assistant | Admitting: Physician Assistant

## 2020-11-07 DIAGNOSIS — W19XXXA Unspecified fall, initial encounter: Secondary | ICD-10-CM

## 2020-11-07 DIAGNOSIS — Y92009 Unspecified place in unspecified non-institutional (private) residence as the place of occurrence of the external cause: Secondary | ICD-10-CM

## 2020-11-07 DIAGNOSIS — M25511 Pain in right shoulder: Secondary | ICD-10-CM

## 2020-11-07 DIAGNOSIS — M19011 Primary osteoarthritis, right shoulder: Secondary | ICD-10-CM | POA: Diagnosis not present

## 2020-11-17 NOTE — Progress Notes (Signed)
DUE TO COVID-19 ONLY ONE VISITOR IS ALLOWED TO COME WITH YOU AND STAY IN THE WAITING ROOM ONLY DURING PRE OP AND PROCEDURE DAY OF SURGERY.  2 VISITOR  MAY VISIT WITH YOU AFTER SURGERY IN YOUR PRIVATE ROOM DURING VISITING HOURS ONLY!  YOU NEED TO HAVE A COVID 19 TEST ON___9/10/2020 ___@_  @_from  8am-3pm _____, THIS TEST MUST BE DONE BEFORE SURGERY,  Covid test is done at [address] [place], [place] [address].  This is a drive thru.  No appt required. Please see map.                 Your procedure is scheduled on:  12/01/2020   Report to [facility] Main  Entrance   Report to admitting at  0845    AM     Call this number if you have problems the morning of surgery [phone]    REMEMBER: NO  SOLID FOOD CANDY OR GUM AFTER MIDNIGHT. CLEAR LIQUIDS UNTIL  0830am         . NOTHING BY MOUTH EXCEPT CLEAR LIQUIDS UNTIL     0830am   . PLEASE FINISH ENSURE DRINK PER SURGEON ORDER  WHICH NEEDS TO BE COMPLETED AT 0830am     .      CLEAR LIQUID DIET   Foods Allowed                                                                    Coffee and tea, regular and decaf                            Fruit ices (not with fruit pulp)                                      Iced Popsicles                                    Carbonated beverages, regular and diet                                    Cranberry, grape and apple juices Sports drinks like Gatorade Lightly seasoned clear broth or consume(fat free) Sugar, honey syrup ___________________________________________________________________      BRUSH YOUR TEETH MORNING OF SURGERY AND RINSE YOUR MOUTH OUT, NO CHEWING GUM CANDY OR MINTS.     Take these medicines the morning of surgery with A SIP OF WATER:  cytomel, amlodipine, synthroid   DO NOT TAKE ANY DIABETIC MEDICATIONS DAY OF YOUR SURGERY                               You may not have any metal on your body including hair pins and              piercings  Do not wear jewelry,  make-up, lotions, powders or perfumes, deodorant  Do not wear nail polish on your fingernails.  Do not shave  48 hours prior to surgery.              Men may shave face and neck.   Do not bring valuables to the hospital. [place].  Contacts, dentures or bridgework may not be worn into surgery.  Leave suitcase in the car. After surgery it may be brought to your room.     Patients discharged the day of surgery will not be allowed to drive home. IF YOU ARE HAVING SURGERY AND GOING HOME THE SAME DAY, YOU MUST HAVE AN ADULT TO DRIVE YOU HOME AND BE WITH YOU FOR 24 HOURS. YOU MAY GO HOME BY TAXI OR UBER OR ORTHERWISE, BUT AN ADULT MUST ACCOMPANY YOU HOME AND STAY WITH YOU FOR 24 HOURS.  Name and phone number of your driver:  Special Instructions: N/A              Please read over the following fact sheets you were given: _____________________________________________________________________  [facility] - Preparing for Surgery Before surgery, you can play an important role.  Because skin is not sterile, your skin needs to be as free of germs as possible.  You can reduce the number of germs on your skin by washing with CHG (chlorahexidine gluconate) soap before surgery.  CHG is an antiseptic cleaner which kills germs and bonds with the skin to continue killing germs even after washing. Please DO NOT use if you have an allergy to CHG or antibacterial soaps.  If your skin becomes reddened/irritated stop using the CHG and inform your nurse when you arrive at Short Stay. Do not shave (including legs and underarms) for at least 48 hours prior to the first CHG shower.  You may shave your face/neck. Please follow these instructions carefully:  1.  Shower with CHG Soap the night before surgery and the  morning of Surgery.  2.  If you choose to wash your hair, wash your hair first as usual with your  normal  shampoo.  3.  After you shampoo, rinse  your hair and body thoroughly to remove the  shampoo.                           4.  Use CHG as you would any other liquid soap.  You can apply chg directly  to the skin and wash                       Gently with a scrungie or clean washcloth.  5.  Apply the CHG Soap to your body ONLY FROM THE NECK DOWN.   Do not use on face/ open                           Wound or open sores. Avoid contact with eyes, ears mouth and genitals (private parts).                       Wash face,  Genitals (private parts) with your normal soap.             6.  Wash thoroughly, paying special attention to the area where your surgery  will be performed.  7.  Thoroughly rinse your body with  warm water from the neck down.  8.  DO NOT shower/wash with your normal soap after using and rinsing off  the CHG Soap.                9.  Pat yourself dry with a clean towel.            10.  Wear clean pajamas.            11.  Place clean sheets on your bed the night of your first shower and do not  sleep with pets. Day of Surgery : Do not apply any lotions/deodorants the morning of surgery.  Please wear clean clothes to the hospital/surgery center.  FAILURE TO FOLLOW THESE INSTRUCTIONS MAY RESULT IN THE CANCELLATION OF YOUR SURGERY PATIENT SIGNATURE_________________________________  NURSE SIGNATURE__________________________________  ________________________________________________________________________

## 2020-11-19 ENCOUNTER — Encounter (HOSPITAL_COMMUNITY)
Admission: RE | Admit: 2020-11-19 | Discharge: 2020-11-19 | Disposition: A | Discharge status: Home / Self Care | Payer: Medicare Other | Source: Ambulatory Visit | Attending: Orthopedic Surgery | Admitting: Orthopedic Surgery

## 2020-11-19 ENCOUNTER — Other Ambulatory Visit: Payer: Self-pay

## 2020-11-19 ENCOUNTER — Encounter (HOSPITAL_COMMUNITY): Payer: Self-pay

## 2020-11-19 DIAGNOSIS — Z87891 Personal history of nicotine dependence: Secondary | ICD-10-CM | POA: Diagnosis not present

## 2020-11-19 DIAGNOSIS — Z01818 Encounter for other preprocedural examination: Secondary | ICD-10-CM | POA: Insufficient documentation

## 2020-11-19 DIAGNOSIS — M1712 Unilateral primary osteoarthritis, left knee: Secondary | ICD-10-CM | POA: Diagnosis not present

## 2020-11-19 DIAGNOSIS — Z79899 Other long term (current) drug therapy: Secondary | ICD-10-CM | POA: Insufficient documentation

## 2020-11-19 HISTORY — DX: Gastro-esophageal reflux disease without esophagitis: K21.9

## 2020-11-19 HISTORY — DX: Prediabetes: R73.03

## 2020-11-19 LAB — COMPREHENSIVE METABOLIC PANEL
ALT: 37 U/L (ref 0–44)
AST: 38 U/L (ref 15–41)
Albumin: 3.8 g/dL (ref 3.5–5.0)
Alkaline Phosphatase: 107 U/L (ref 38–126)
Anion gap: 7 (ref 5–15)
BUN: 20 mg/dL (ref 8–23)
CO2: 30 mmol/L (ref 22–32)
Calcium: 9.1 mg/dL (ref 8.9–10.3)
Chloride: 103 mmol/L (ref 98–111)
Creatinine, Ser: 0.79 mg/dL (ref 0.44–1.00)
GFR, Estimated: >60 mL/min (ref >60)
Glucose, Bld: 93 mg/dL (ref 70–99)
Potassium: 3.9 mmol/L (ref 3.5–5.1)
Sodium: 140 mmol/L (ref 135–145)
Total Bilirubin: 0.7 mg/dL (ref 0.3–1.2)
Total Protein: 7.7 g/dL (ref 6.5–8.1)

## 2020-11-19 LAB — CBC
HCT: 35.6 % — ABNORMAL LOW (ref 36.0–46.0)
Hemoglobin: 10.8 g/dL — ABNORMAL LOW (ref 12.0–15.0)
MCH: 25.8 pg — ABNORMAL LOW (ref 26.0–34.0)
MCHC: 30.3 g/dL (ref 30.0–36.0)
MCV: 85 fL (ref 80.0–100.0)
Platelets: 224 10*3/uL (ref 150–400)
RBC: 4.19 MIL/uL (ref 3.87–5.11)
RDW: 15.5 % (ref 11.5–15.5)
WBC: 5.2 10*3/uL (ref 4.0–10.5)
nRBC: 0 % (ref 0.0–0.2)

## 2020-11-19 LAB — TYPE AND SCREEN
ABO/RH(D): O POS
Antibody Screen: NEGATIVE

## 2020-11-19 LAB — PROTIME-INR
INR: 1 (ref 0.8–1.2)
Prothrombin Time: 13.3 seconds (ref 11.4–15.2)

## 2020-11-19 LAB — HEMOGLOBIN A1C
Hgb A1c MFr Bld: 6 % — ABNORMAL HIGH (ref 4.8–5.6)
Mean Plasma Glucose: 125.5 mg/dL

## 2020-11-19 LAB — SURGICAL PCR SCREEN
MRSA, PCR: NEGATIVE
Staphylococcus aureus: NEGATIVE

## 2020-11-19 LAB — APTT: aPTT: 34 seconds (ref 24–36)

## 2020-11-19 NOTE — Progress Notes (Addendum)
Anesthesia Review:  PCP: Dr Renford Dills - pt states she is supposed to see for clearance for surgery.  Has not seen yet.  Reminded pt she needs to make an appt to see him.   Cardiologist :none  Chest x-ray : EKG :11/19/20  Echo : Stress test: Cardiac Cath :  Activity level: can do a flight of stairs without difficulty  Sleep Study/ CPAP : has cpap  Fasting Blood Sugar :      / Checks Blood Sugar -- times a day:   Blood Thinner/ Instructions /Last Dose: ASA / Instructions/ Last Dose :   Pt had fall and injured right shoulder on 10/21/20.  PT states she has upcoming appt with MD at [facility].  81 mg aspirin  Covid test on 11/27/20.   CBC done 11/19/2020 routed to Dr Lequita Halt.  Hgba1c-11/19/20-6.0

## 2020-11-20 DIAGNOSIS — M25511 Pain in right shoulder: Secondary | ICD-10-CM | POA: Diagnosis not present

## 2020-11-27 ENCOUNTER — Other Ambulatory Visit: Payer: Self-pay

## 2020-11-28 DIAGNOSIS — E039 Hypothyroidism, unspecified: Secondary | ICD-10-CM | POA: Diagnosis not present

## 2020-11-28 DIAGNOSIS — I10 Essential (primary) hypertension: Secondary | ICD-10-CM | POA: Diagnosis not present

## 2020-11-28 DIAGNOSIS — M1712 Unilateral primary osteoarthritis, left knee: Secondary | ICD-10-CM | POA: Diagnosis not present

## 2020-11-28 DIAGNOSIS — E7800 Pure hypercholesterolemia, unspecified: Secondary | ICD-10-CM | POA: Diagnosis not present

## 2020-11-28 DIAGNOSIS — G4730 Sleep apnea, unspecified: Secondary | ICD-10-CM | POA: Diagnosis not present

## 2020-11-28 LAB — SARS CORONAVIRUS 2 (TAT 6-24 HRS): SARS Coronavirus 2: NEGATIVE

## 2020-11-30 NOTE — H&P (Signed)
TOTAL KNEE ADMISSION H&P  Patient is being admitted for left total knee arthroplasty.  Subjective:  Chief Complaint: Left knee pain.  HPI: Ashley York, 65 y.o. female has a history of pain and functional disability in the left knee due to arthritis and has failed non-surgical conservative treatments for greater than 12 weeks to include NSAID's and/or analgesics and activity modification. Onset of symptoms was gradual, starting  several  years ago with gradually worsening course since that time. The patient noted no past surgery on the left knee.  Patient currently rates pain in the left knee at 7 out of 10 with activity. Patient has worsening of pain with activity and weight bearing, pain that interferes with activities of daily living, pain with passive range of motion, and crepitus. Patient has evidence of periarticular osteophytes and joint space narrowing by imaging studies. There is no active infection.  Patient Active Problem List   Diagnosis Date Noted   Metabolic syndrome 82/42/3536   Dyspnea on exertion 11/17/2017   Cardiomegaly 11/17/2017   CHOLELITHIASIS 05/10/2008   CONSTIPATION 04/23/2008   NAUSEA 04/23/2008   FLATULENCE 04/23/2008   HYPOTHYROIDISM 04/19/2008   Essential hypertension 04/19/2008   HEMORRHOIDS, INTERNAL 04/19/2008    Past Medical History:  Diagnosis Date   Allergy    Cardiomegaly    CHOLELITHIASIS 05/10/2008   Qualifier: Diagnosis of  By: Gretta Cool RN, Cheryl     CONSTIPATION 04/23/2008   Qualifier: Diagnosis of  By: Henrene Pastor MD, Docia Chuck    Dyspnea on exertion    Gallstones    GERD (gastroesophageal reflux disease)    Heart murmur    HEMORRHOIDS, INTERNAL 04/19/2008   Qualifier: Diagnosis of  By: Marland Mcalpine     Hyperlipidemia    Hypertension    HYPERTENSION 04/19/2008   Qualifier: Diagnosis of  By: Marland Mcalpine     Hypothyroidism    HYPOTHYROIDISM 04/19/2008   Qualifier: Diagnosis of  By: Marland Mcalpine     Osteoarthritis     Overweight    Pre-diabetes    Sleep apnea    cpap    Past Surgical History:  Procedure Laterality Date   Home   KNEE SURGERY Right 2010   WISDOM TOOTH EXTRACTION  1999    Prior to Admission medications   Medication Sig Start Date End Date Taking? Authorizing Provider  Alpha-Lipoic Acid 600 MG TABS Take 600 mg by mouth daily.   Yes [provider]  amLODipine (NORVASC) 10 MG tablet Take 10 mg by mouth daily.   Yes [provider]  atorvastatin (LIPITOR) 20 MG tablet Take 20 mg by mouth daily.   Yes [provider]  Barberry-Oreg Grape-Goldenseal (BERBERINE COMPLEX PO) Take 1 capsule by mouth daily.   Yes [provider]  cholecalciferol (VITAMIN D) 25 MCG (1000 UNIT) tablet Take 1,000 Units by mouth daily.   Yes [provider]  clobetasol cream (TEMOVATE) 1.44 % Apply 1 application topically 2 (two) times daily as needed (rash).   Yes [provider]  Coenzyme Q10 (COQ10) 100 MG CAPS Take 100 mg by mouth daily.   Yes [provider]  Cyanocobalamin (B-12 PO) Take 1 tablet by mouth daily.   Yes [provider]  levothyroxine (SYNTHROID) 150 MCG tablet Take 150 mcg by mouth daily before breakfast.   Yes [provider]  liothyronine (CYTOMEL) 5 MCG tablet Take 5 mcg by mouth daily.   Yes [provider]  losartan-hydrochlorothiazide Konrad Penta)  100-25 MG tablet Take 1 tablet by mouth daily.  11/17/11  Yes [provider]  Magnesium 250 MG TABS Take 250 mg by mouth in the morning and at bedtime.   Yes [provider]  meloxicam (MOBIC) 15 MG tablet Take 15 mg by mouth daily. 10/21/20  Yes [provider]  Multiple Vitamin (MULTIVITAMIN WITH MINERALS) TABS tablet Take 1 tablet by mouth daily.   Yes [provider]  Turmeric 500 MG CAPS Take 500 mg by mouth daily.   Yes [provider]  vitamin C (ASCORBIC ACID) 500 MG tablet Take 500 mg by  mouth daily.   Yes [provider]  zinc gluconate 50 MG tablet Take 50 mg by mouth daily.   Yes [provider]  pantoprazole (PROTONIX) 40 MG tablet Take 1 tablet (40 mg total) by mouth daily. Patient not taking: No sig reported 07/04/19   Irene Shipper, MD    Allergies  Allergen Reactions   Sulfonamide Derivatives Rash    Social History   Socioeconomic History   Marital status: Married    Spouse name: Not on file   Number of children: 2   Years of education: Not on file   Highest education level: Master's degree (e.g., MA, MS, MEng, MEd, MSW, MBA)  Occupational History   Occupation: RETIRED SCHOOL COUNSELOR  Tobacco Use   Smoking status: Former    Types: Cigarettes    Quit date: 1979    Years since quitting: 43.7   Smokeless tobacco: Never  Vaping Use   Vaping Use: Never used  Substance and Sexual Activity   Alcohol use: Yes    Comment: SOCIAL   Drug use: No   Sexual activity: Yes    Birth control/protection: None    Comment: pm   Other Topics Concern   Not on file  Social History Narrative   Not on file   Social Determinants of Health   Financial Resource Strain: Not on file  Food Insecurity: Not on file  Transportation Needs: Not on file  Physical Activity: Not on file  Stress: Not on file  Social Connections: Not on file  Intimate Partner Violence: Not on file    Tobacco Use: Medium Risk   Smoking Tobacco Use: Former   Smokeless Tobacco Use: Never   Social History   Substance and Sexual Activity  Alcohol Use Yes   Comment: SOCIAL    Family History  Problem Relation Age of Onset   Diabetes Mother    Sarcoidosis Mother    Hypertension Mother    Diabetes Father    Multiple myeloma Father    Breast cancer Maternal Grandmother    Stroke Maternal Grandfather    Other Sister        GUN SHOT WOUND   Other Brother        GUN SHOT WOUND   Colon cancer Neg Hx    Colon polyps Neg Hx    Esophageal cancer Neg Hx    Stomach cancer  Neg Hx    Rectal cancer Neg Hx     ROS: Constitutional: no fever, no chills, no night sweats, no significant weight loss Cardiovascular: no chest pain, no palpitations Respiratory: no cough, no shortness of breath, No COPD Gastrointestinal: no vomiting, no nausea Musculoskeletal: no swelling in Joints, Joint Pain Neurologic: no numbness, no tingling, no difficulty with balance   Objective:  Physical Exam: Well nourished and well developed.  General: Alert and oriented x3, cooperative and pleasant, no  acute distress.  Head: normocephalic, atraumatic, neck supple.  Eyes: EOMI.  Respiratory: breath sounds clear in all fields, no wheezing, rales, or rhonchi. Cardiovascular: Regular rate and rhythm, no murmurs, gallops or rubs.  Abdomen: non-tender to palpation and soft, normoactive bowel sounds. Musculoskeletal:  The patient has a significantly antalgic gait pattern, worse on the left.    Bilateral Hip Exam:  Range of motion: Normal without discomfort.    Right Knee Exam:  Slight varus deformity.  No effusion present. No swelling present.  Range of motion: 5 to 100 degrees.  Moderate crepitus on range of motion of the knee.  Positive medial greater than lateral joint line tenderness.  The knee is stable.    Left Knee Exam:  Slight varus deformity.  No effusion present. No swelling present.  Range of motion: 5 to 95 degrees.  Moderate crepitus on range of motion of the knee.  Medial greater than lateral joint line tenderness.  The knee is stable.    The patient's sensation and motor function are intact in their lower extremities. Their distal pulses are 2+. The bilateral calves are soft and non-tender.  Vital signs in last 24 hours:    Imaging Review Radiographs - AP and lateral of the bilateral knees dated 04/25/2020 demonstrate severe end-stage arthritis, medial and patellofemoral, in both knees, left slightly worse than the right. She has large osteophyte formation  in both knees.   Assessment/Plan:  End stage arthritis, left knee   The patient history, physical examination, clinical judgment of the provider and imaging studies are consistent with end stage degenerative joint disease of the left knee and total knee arthroplasty is deemed medically necessary. The treatment options including medical management, injection therapy arthroscopy and arthroplasty were discussed at length. The risks and benefits of total knee arthroplasty were presented and reviewed. The risks due to aseptic loosening, infection, stiffness, patella tracking problems, thromboembolic complications and other imponderables were discussed. The patient acknowledged the explanation, agreed to proceed with the plan and consent was signed. Patient is being admitted for inpatient treatment for surgery, pain control, PT, OT, prophylactic antibiotics, VTE prophylaxis, progressive ambulation and ADLs and discharge planning. The patient is planning to be discharged  home .   Patient's anticipated LOS is less than 2 midnights, meeting these requirements: - Younger than 36 - Lives within 1 hour of care - Has a competent adult at home to recover with post-op  - NO history of  - Chronic pain requiring opioids  - Diabetes  - Coronary Artery Disease  - Heart failure  - Heart attack  - Stroke  - DVT/VTE  - Cardiac arrhythmia  - Respiratory Failure/COPD  - Renal failure  - Anemia  - Advanced Liver disease    Therapy Plans: [facility] Disposition: Home with Husband Planned DVT Prophylaxis: Aspirin 374m DME Needed: None  PCP: RSeward Carol MD - [facility] -  --- Clearance *Not received* ---  TXA: IV Allergies: Sulfa drugs (rash) Anesthesia Concerns: None BMI: 39.1 Last HgbA1c: N/A  [facility] - Patient was instructed on what medications to stop prior to surgery. - Follow-up visit in 2 weeks with Dr. AWynelle Link- Begin physical therapy  following surgery - Pre-operative lab work as pre-surgical testing - Prescriptions will be provided in hospital at time of discharge  SFenton Foy [facility] PA-C Orthopedic Surgery [facility] [place]

## 2020-11-30 NOTE — Anesthesia Preprocedure Evaluation (Addendum)
Anesthesia Evaluation  Patient identified by MRN, date of birth, ID band Patient awake    Reviewed: Allergy & Precautions, NPO status , Patient's Chart, lab work & pertinent test results  Airway Mallampati: II  TM Distance: >3 FB Neck ROM: Full    Dental  (+) Teeth Intact   Pulmonary sleep apnea and Continuous Positive Airway Pressure Ventilation , former smoker,    Pulmonary exam normal        Cardiovascular hypertension, Pt. on medications  Rhythm:Regular Rate:Normal     Neuro/Psych negative neurological ROS  negative psych ROS   GI/Hepatic Neg liver ROS, GERD  Medicated,  Endo/Other  Hypothyroidism Morbid obesity  Renal/GU negative Renal ROS  negative genitourinary   Musculoskeletal  (+) Arthritis , Osteoarthritis,    Abdominal (+)  Abdomen: soft.    Peds  Hematology negative hematology ROS (+)   Anesthesia Other Findings   Reproductive/Obstetrics                            Anesthesia Physical Anesthesia Plan  ASA: 3  Anesthesia Plan: MAC, Regional and Spinal   Post-op Pain Management:  Regional for Post-op pain   Induction:   PONV Risk Score and Plan: 2 and Ondansetron, Dexamethasone, Midazolam, Propofol infusion and Treatment may vary due to age or medical condition  Airway Management Planned: Simple Face Mask, Natural Airway and Nasal Cannula  Additional Equipment: None  Intra-op Plan:   Post-operative Plan:   Informed Consent: I have reviewed the patients History and Physical, chart, labs and discussed the procedure including the risks, benefits and alternatives for the proposed anesthesia with the patient or authorized representative who has indicated his/her understanding and acceptance.     Dental advisory given  Plan Discussed with: CRNA  Anesthesia Plan Comments: (Lab Results      Component                Value               Date                      WBC                       5.2                 11/19/2020                HGB                      10.8 (L)            11/19/2020                HCT                      35.6 (L)            11/19/2020                MCV                      85.0                11/19/2020                PLT  224                 11/19/2020           Lab Results      Component                Value               Date                      NA                       140                 11/19/2020                K                        3.9                 11/19/2020                CO2                      30                  11/19/2020                GLUCOSE                  93                  11/19/2020                BUN                      20                  11/19/2020                CREATININE               0.79                11/19/2020                CALCIUM                  9.1                 11/19/2020                GFRNONAA                 >60                 11/19/2020                GFRAA                                        10/24/2008            >60        The eGFR has been calculated using the MDRD equation. This calculation has not been validated in all clinical situations. eGFR's persistently <60 mL/min signify possible Chronic Kidney  Disease.)       Anesthesia Quick Evaluation

## 2020-12-01 ENCOUNTER — Ambulatory Visit (HOSPITAL_COMMUNITY): Payer: Medicare Other | Admitting: Certified Registered"

## 2020-12-01 ENCOUNTER — Encounter (HOSPITAL_COMMUNITY)
Admission: RE | Disposition: A | Discharge status: Home / Self Care | Payer: Self-pay | Source: Ambulatory Visit | Attending: Orthopedic Surgery

## 2020-12-01 ENCOUNTER — Other Ambulatory Visit: Payer: Self-pay

## 2020-12-01 ENCOUNTER — Ambulatory Visit (HOSPITAL_COMMUNITY)
Admission: RE | Admit: 2020-12-01 | Discharge: 2020-12-02 | Disposition: A | Discharge status: Home / Self Care | Payer: Medicare Other | Source: Ambulatory Visit | Attending: Orthopedic Surgery | Admitting: Orthopedic Surgery

## 2020-12-01 ENCOUNTER — Ambulatory Visit (HOSPITAL_COMMUNITY): Payer: Medicare Other | Admitting: Physician Assistant

## 2020-12-01 ENCOUNTER — Encounter (HOSPITAL_COMMUNITY): Payer: Self-pay | Admitting: Orthopedic Surgery

## 2020-12-01 DIAGNOSIS — M1712 Unilateral primary osteoarthritis, left knee: Secondary | ICD-10-CM | POA: Diagnosis not present

## 2020-12-01 DIAGNOSIS — Z882 Allergy status to sulfonamides status: Secondary | ICD-10-CM | POA: Insufficient documentation

## 2020-12-01 DIAGNOSIS — Z79890 Hormone replacement therapy (postmenopausal): Secondary | ICD-10-CM | POA: Diagnosis not present

## 2020-12-01 DIAGNOSIS — Z87891 Personal history of nicotine dependence: Secondary | ICD-10-CM | POA: Diagnosis not present

## 2020-12-01 DIAGNOSIS — E039 Hypothyroidism, unspecified: Secondary | ICD-10-CM | POA: Insufficient documentation

## 2020-12-01 DIAGNOSIS — G8918 Other acute postprocedural pain: Secondary | ICD-10-CM | POA: Diagnosis not present

## 2020-12-01 DIAGNOSIS — M179 Osteoarthritis of knee, unspecified: Secondary | ICD-10-CM

## 2020-12-01 DIAGNOSIS — Z79899 Other long term (current) drug therapy: Secondary | ICD-10-CM | POA: Diagnosis not present

## 2020-12-01 DIAGNOSIS — K219 Gastro-esophageal reflux disease without esophagitis: Secondary | ICD-10-CM | POA: Insufficient documentation

## 2020-12-01 DIAGNOSIS — M1710 Unilateral primary osteoarthritis, unspecified knee: Secondary | ICD-10-CM | POA: Diagnosis present

## 2020-12-01 DIAGNOSIS — I10 Essential (primary) hypertension: Secondary | ICD-10-CM | POA: Diagnosis not present

## 2020-12-01 HISTORY — PX: TOTAL KNEE ARTHROPLASTY: SHX125

## 2020-12-01 LAB — ABO/RH: ABO/RH(D): O POS

## 2020-12-01 LAB — GLUCOSE, CAPILLARY
Glucose-Capillary: 96 mg/dL (ref 70–99)
Glucose-Capillary: 106 mg/dL — ABNORMAL HIGH (ref 70–99)

## 2020-12-01 SURGERY — ARTHROPLASTY, KNEE, TOTAL
Anesthesia: Monitor Anesthesia Care | Site: Knee | Laterality: Left

## 2020-12-01 MED ORDER — LIDOCAINE 2% (20 MG/ML) 5 ML SYRINGE
INTRAMUSCULAR | Status: AC
  Filled 2020-12-01: qty 5

## 2020-12-01 MED ORDER — SODIUM CHLORIDE 0.9 % IR SOLN
Status: DC | PRN
  Administered 2020-12-01: 1000 mL

## 2020-12-01 MED ORDER — CEFAZOLIN SODIUM-DEXTROSE 2-4 GM/100ML-% IV SOLN
2.0000 g | INTRAVENOUS | Status: AC
  Administered 2020-12-01: 2 g via INTRAVENOUS
  Filled 2020-12-01: qty 100

## 2020-12-01 MED ORDER — BISACODYL 10 MG RE SUPP: 10.0000 mg | Freq: Every day | RECTAL | Status: DC | PRN

## 2020-12-01 MED ORDER — SODIUM CHLORIDE 0.9 % IV SOLN: INTRAVENOUS | Status: DC

## 2020-12-01 MED ORDER — PROPOFOL 1000 MG/100ML IV EMUL
INTRAVENOUS | Status: AC
  Filled 2020-12-01: qty 200

## 2020-12-01 MED ORDER — BUPIVACAINE LIPOSOME 1.3 % IJ SUSP: 20.0000 mL | Freq: Once | INTRAMUSCULAR | Status: DC

## 2020-12-01 MED ORDER — LEVOTHYROXINE SODIUM 75 MCG PO TABS
150.0000 ug | ORAL_TABLET | Freq: Every day | ORAL | Status: DC
  Administered 2020-12-02: 150 ug via ORAL
  Filled 2020-12-01: qty 2

## 2020-12-01 MED ORDER — STERILE WATER FOR IRRIGATION IR SOLN
Status: DC | PRN
  Administered 2020-12-01: 2000 mL

## 2020-12-01 MED ORDER — BUPIVACAINE IN DEXTROSE 0.75-8.25 % IT SOLN
INTRATHECAL | Status: DC | PRN
  Administered 2020-12-01: 1.6 mL via INTRATHECAL

## 2020-12-01 MED ORDER — ACETAMINOPHEN 10 MG/ML IV SOLN
1000.0000 mg | Freq: Once | INTRAVENOUS | Status: DC | PRN
Start: 2020-12-01 — End: 2020-12-01

## 2020-12-01 MED ORDER — ASPIRIN EC 325 MG PO TBEC
325.0000 mg | DELAYED_RELEASE_TABLET | Freq: Two times a day (BID) | ORAL | Status: DC
  Administered 2020-12-02: 325 mg via ORAL
  Filled 2020-12-01: qty 1

## 2020-12-01 MED ORDER — BUPIVACAINE LIPOSOME 1.3 % IJ SUSP
INTRAMUSCULAR | Status: DC | PRN
  Administered 2020-12-01: 20 mL

## 2020-12-01 MED ORDER — ATORVASTATIN CALCIUM 20 MG PO TABS
20.0000 mg | ORAL_TABLET | Freq: Every day | ORAL | Status: DC
  Administered 2020-12-01: 20 mg via ORAL
  Filled 2020-12-01: qty 1

## 2020-12-01 MED ORDER — CEFAZOLIN SODIUM-DEXTROSE 2-4 GM/100ML-% IV SOLN
2.0000 g | Freq: Four times a day (QID) | INTRAVENOUS | Status: AC
Start: 2020-12-01 — End: 2020-12-01
  Administered 2020-12-01 (×2): 2 g via INTRAVENOUS
  Filled 2020-12-01 (×2): qty 100

## 2020-12-01 MED ORDER — LOSARTAN POTASSIUM-HCTZ 100-25 MG PO TABS: 1.0000 | ORAL_TABLET | Freq: Every day | ORAL | Status: DC

## 2020-12-01 MED ORDER — PANTOPRAZOLE SODIUM 40 MG PO TBEC: 40.0000 mg | DELAYED_RELEASE_TABLET | Freq: Every day | ORAL | Status: DC

## 2020-12-01 MED ORDER — ACETAMINOPHEN 10 MG/ML IV SOLN
1000.0000 mg | Freq: Four times a day (QID) | INTRAVENOUS | Status: DC
  Administered 2020-12-01: 1000 mg via INTRAVENOUS
  Filled 2020-12-01: qty 100

## 2020-12-01 MED ORDER — DEXAMETHASONE SODIUM PHOSPHATE 10 MG/ML IJ SOLN
INTRAMUSCULAR | Status: DC | PRN
  Administered 2020-12-01: 5 mg

## 2020-12-01 MED ORDER — HYDROMORPHONE HCL 1 MG/ML IJ SOLN
0.5000 mg | INTRAMUSCULAR | Status: DC | PRN
  Administered 2020-12-01: 1 mg via INTRAVENOUS
  Filled 2020-12-01: qty 1

## 2020-12-01 MED ORDER — DEXAMETHASONE SODIUM PHOSPHATE 10 MG/ML IJ SOLN
INTRAMUSCULAR | Status: AC
  Filled 2020-12-01: qty 1

## 2020-12-01 MED ORDER — DEXAMETHASONE SODIUM PHOSPHATE 10 MG/ML IJ SOLN
8.0000 mg | Freq: Once | INTRAMUSCULAR | Status: AC
  Administered 2020-12-01: 8 mg via INTRAVENOUS

## 2020-12-01 MED ORDER — MAGNESIUM OXIDE -MG SUPPLEMENT 400 (240 MG) MG PO TABS
200.0000 mg | ORAL_TABLET | Freq: Two times a day (BID) | ORAL | Status: DC
  Administered 2020-12-01 – 2020-12-02 (×2): 200 mg via ORAL
  Filled 2020-12-01 (×2): qty 1

## 2020-12-01 MED ORDER — DOCUSATE SODIUM 100 MG PO CAPS
100.0000 mg | ORAL_CAPSULE | Freq: Two times a day (BID) | ORAL | Status: DC
  Administered 2020-12-01 – 2020-12-02 (×3): 100 mg via ORAL
  Filled 2020-12-01 (×3): qty 1

## 2020-12-01 MED ORDER — PROPOFOL 500 MG/50ML IV EMUL
INTRAVENOUS | Status: DC | PRN
  Administered 2020-12-01: 75 ug/kg/min via INTRAVENOUS

## 2020-12-01 MED ORDER — LACTATED RINGERS IV SOLN: INTRAVENOUS | Status: DC

## 2020-12-01 MED ORDER — OXYCODONE HCL 5 MG PO TABS
5.0000 mg | ORAL_TABLET | ORAL | Status: DC | PRN
  Administered 2020-12-01 – 2020-12-02 (×2): 10 mg via ORAL
  Filled 2020-12-01 (×3): qty 2

## 2020-12-01 MED ORDER — FENTANYL CITRATE (PF) 100 MCG/2ML IJ SOLN
INTRAMUSCULAR | Status: AC
  Filled 2020-12-01: qty 2

## 2020-12-01 MED ORDER — DIPHENHYDRAMINE HCL 12.5 MG/5ML PO ELIX: 12.5000 mg | ORAL_SOLUTION | ORAL | Status: DC | PRN

## 2020-12-01 MED ORDER — AMLODIPINE BESYLATE 10 MG PO TABS
10.0000 mg | ORAL_TABLET | Freq: Every day | ORAL | Status: DC
  Administered 2020-12-01: 10 mg via ORAL
  Filled 2020-12-01: qty 1

## 2020-12-01 MED ORDER — CLOBETASOL PROPIONATE 0.05 % EX CREA
1.0000 "application " | TOPICAL_CREAM | Freq: Two times a day (BID) | CUTANEOUS | Status: DC | PRN
  Filled 2020-12-01: qty 15

## 2020-12-01 MED ORDER — MIDAZOLAM HCL 2 MG/2ML IJ SOLN
INTRAMUSCULAR | Status: DC | PRN
  Administered 2020-12-01: 2 mg via INTRAVENOUS

## 2020-12-01 MED ORDER — PROPOFOL 10 MG/ML IV BOLUS
INTRAVENOUS | Status: AC
  Filled 2020-12-01: qty 20

## 2020-12-01 MED ORDER — ONDANSETRON HCL 4 MG/2ML IJ SOLN
4.0000 mg | Freq: Four times a day (QID) | INTRAMUSCULAR | Status: DC | PRN
  Administered 2020-12-02: 4 mg via INTRAVENOUS
  Filled 2020-12-01: qty 2

## 2020-12-01 MED ORDER — POLYETHYLENE GLYCOL 3350 17 G PO PACK: 17.0000 g | PACK | Freq: Every day | ORAL | Status: DC | PRN

## 2020-12-01 MED ORDER — METHOCARBAMOL 500 MG PO TABS
500.0000 mg | ORAL_TABLET | Freq: Four times a day (QID) | ORAL | Status: DC | PRN
  Administered 2020-12-01 – 2020-12-02 (×4): 500 mg via ORAL
  Filled 2020-12-01 (×4): qty 1

## 2020-12-01 MED ORDER — METOCLOPRAMIDE HCL 5 MG PO TABS
5.0000 mg | ORAL_TABLET | Freq: Three times a day (TID) | ORAL | Status: DC | PRN
Start: 2020-12-01 — End: 2020-12-02

## 2020-12-01 MED ORDER — PHENOL 1.4 % MT LIQD: 1.0000 | OROMUCOSAL | Status: DC | PRN

## 2020-12-01 MED ORDER — ACETAMINOPHEN 500 MG PO TABS
1000.0000 mg | ORAL_TABLET | Freq: Four times a day (QID) | ORAL | Status: AC
  Administered 2020-12-01 – 2020-12-02 (×4): 1000 mg via ORAL
  Filled 2020-12-01 (×5): qty 2

## 2020-12-01 MED ORDER — METOCLOPRAMIDE HCL 5 MG/ML IJ SOLN: 5.0000 mg | Freq: Three times a day (TID) | INTRAMUSCULAR | Status: DC | PRN

## 2020-12-01 MED ORDER — FENTANYL CITRATE PF 50 MCG/ML IJ SOSY: 25.0000 ug | PREFILLED_SYRINGE | INTRAMUSCULAR | Status: DC | PRN

## 2020-12-01 MED ORDER — ONDANSETRON HCL 4 MG/2ML IJ SOLN
INTRAMUSCULAR | Status: AC
  Filled 2020-12-01: qty 2

## 2020-12-01 MED ORDER — METHOCARBAMOL 500 MG IVPB - SIMPLE MED
500.0000 mg | Freq: Four times a day (QID) | INTRAVENOUS | Status: DC | PRN
  Filled 2020-12-01: qty 50

## 2020-12-01 MED ORDER — ONDANSETRON HCL 4 MG/2ML IJ SOLN
INTRAMUSCULAR | Status: DC | PRN
  Administered 2020-12-01: 4 mg via INTRAVENOUS

## 2020-12-01 MED ORDER — TRAMADOL HCL 50 MG PO TABS
50.0000 mg | ORAL_TABLET | Freq: Four times a day (QID) | ORAL | Status: DC | PRN
  Administered 2020-12-01: 50 mg via ORAL
  Administered 2020-12-02 (×2): 100 mg via ORAL
  Filled 2020-12-01: qty 1
  Filled 2020-12-01 (×2): qty 2

## 2020-12-01 MED ORDER — 0.9 % SODIUM CHLORIDE (POUR BTL) OPTIME
TOPICAL | Status: DC | PRN
  Administered 2020-12-01: 1000 mL

## 2020-12-01 MED ORDER — HYDROCHLOROTHIAZIDE 25 MG PO TABS
25.0000 mg | ORAL_TABLET | Freq: Every day | ORAL | Status: DC
  Administered 2020-12-01: 25 mg via ORAL
  Filled 2020-12-01: qty 1

## 2020-12-01 MED ORDER — FENTANYL CITRATE (PF) 100 MCG/2ML IJ SOLN
INTRAMUSCULAR | Status: DC | PRN
  Administered 2020-12-01 (×2): 50 ug via INTRAVENOUS

## 2020-12-01 MED ORDER — POVIDONE-IODINE 10 % EX SWAB
2.0000 "application " | Freq: Once | CUTANEOUS | Status: AC
  Administered 2020-12-01: 2 via TOPICAL

## 2020-12-01 MED ORDER — ONDANSETRON HCL 4 MG PO TABS
4.0000 mg | ORAL_TABLET | Freq: Four times a day (QID) | ORAL | Status: DC | PRN
  Administered 2020-12-02: 4 mg via ORAL
  Filled 2020-12-01: qty 1

## 2020-12-01 MED ORDER — MIDAZOLAM HCL 2 MG/2ML IJ SOLN
INTRAMUSCULAR | Status: AC
  Filled 2020-12-01: qty 2

## 2020-12-01 MED ORDER — TRANEXAMIC ACID-NACL 1000-0.7 MG/100ML-% IV SOLN
1000.0000 mg | INTRAVENOUS | Status: AC
  Administered 2020-12-01: 1000 mg via INTRAVENOUS
  Filled 2020-12-01: qty 100

## 2020-12-01 MED ORDER — BUPIVACAINE LIPOSOME 1.3 % IJ SUSP
INTRAMUSCULAR | Status: AC
  Filled 2020-12-01: qty 20

## 2020-12-01 MED ORDER — FLEET ENEMA 7-19 GM/118ML RE ENEM
1.0000 | ENEMA | Freq: Once | RECTAL | Status: DC | PRN
Start: 2020-12-01 — End: 2020-12-02

## 2020-12-01 MED ORDER — LIDOCAINE 2% (20 MG/ML) 5 ML SYRINGE
INTRAMUSCULAR | Status: DC | PRN
  Administered 2020-12-01: 60 mg via INTRAVENOUS

## 2020-12-01 MED ORDER — MENTHOL 3 MG MT LOZG: 1.0000 | LOZENGE | OROMUCOSAL | Status: DC | PRN

## 2020-12-01 MED ORDER — LOSARTAN POTASSIUM 50 MG PO TABS
100.0000 mg | ORAL_TABLET | Freq: Every day | ORAL | Status: DC
  Administered 2020-12-01: 100 mg via ORAL
  Filled 2020-12-01: qty 2

## 2020-12-01 MED ORDER — DEXAMETHASONE SODIUM PHOSPHATE 10 MG/ML IJ SOLN
10.0000 mg | Freq: Once | INTRAMUSCULAR | Status: AC
  Administered 2020-12-02: 10 mg via INTRAVENOUS
  Filled 2020-12-01: qty 1

## 2020-12-01 MED ORDER — SODIUM CHLORIDE (PF) 0.9 % IJ SOLN
INTRAMUSCULAR | Status: DC | PRN
  Administered 2020-12-01: 60 mL

## 2020-12-01 MED ORDER — ROPIVACAINE HCL 5 MG/ML IJ SOLN
INTRAMUSCULAR | Status: DC | PRN
  Administered 2020-12-01: 25 mL via PERINEURAL

## 2020-12-01 SURGICAL SUPPLY — 56 items
ATTUNE MED DOME PAT 38 KNEE (Knees) ×2 IMPLANT
ATTUNE PSFEM LTSZ6 NARCEM KNEE (Femur) ×2 IMPLANT
ATTUNE PSRP INSR SZ6 8 KNEE (Insert) ×2 IMPLANT
BAG COUNTER SPONGE SURGICOUNT (BAG) IMPLANT
BAG ZIPLOCK 12X15 (MISCELLANEOUS) ×2 IMPLANT
BASE TIBIAL ROT PLAT SZ 5 KNEE (Knees) ×1 IMPLANT
BLADE SAG 18X100X1.27 (BLADE) ×2 IMPLANT
BLADE SAW SGTL 11.0X1.19X90.0M (BLADE) ×2 IMPLANT
BNDG ELASTIC 6X5.8 VLCR STR LF (GAUZE/BANDAGES/DRESSINGS) ×2 IMPLANT
BOWL SMART MIX CTS (DISPOSABLE) ×2 IMPLANT
CEMENT HV SMART SET (Cement) ×4 IMPLANT
COVER SURGICAL LIGHT HANDLE (MISCELLANEOUS) ×2 IMPLANT
CUFF TOURN SGL QUICK 34 (TOURNIQUET CUFF)
CUFF TOURN SGL QUICK 42 (TOURNIQUET CUFF) ×2 IMPLANT
CUFF TRNQT CYL 34X4.125X (TOURNIQUET CUFF) IMPLANT
DECANTER SPIKE VIAL GLASS SM (MISCELLANEOUS) ×2 IMPLANT
DRAPE INCISE IOBAN 66X45 STRL (DRAPES) ×6 IMPLANT
DRAPE U-SHAPE 47X51 STRL (DRAPES) ×2 IMPLANT
DRSG AQUACEL AG ADV 3.5X10 (GAUZE/BANDAGES/DRESSINGS) ×2 IMPLANT
DURAPREP 26ML APPLICATOR (WOUND CARE) ×2 IMPLANT
ELECT REM PT RETURN 15FT ADLT (MISCELLANEOUS) ×2 IMPLANT
GLOVE SRG 8 PF TXTR STRL LF DI (GLOVE) ×1 IMPLANT
GLOVE SURG ENC MOIS LTX SZ6.5 (GLOVE) ×2 IMPLANT
GLOVE SURG ENC MOIS LTX SZ8 (GLOVE) ×4 IMPLANT
GLOVE SURG UNDER POLY LF SZ7 (GLOVE) ×2 IMPLANT
GLOVE SURG UNDER POLY LF SZ8 (GLOVE) ×2
GLOVE SURG UNDER POLY LF SZ8.5 (GLOVE) ×2 IMPLANT
GOWN STRL REUS W/TWL LRG LVL3 (GOWN DISPOSABLE) ×4 IMPLANT
GOWN STRL REUS W/TWL XL LVL3 (GOWN DISPOSABLE) ×2 IMPLANT
HANDPIECE INTERPULSE COAX TIP (DISPOSABLE) ×2
HOLDER FOLEY CATH W/STRAP (MISCELLANEOUS) IMPLANT
IMMOBILIZER KNEE 20 (SOFTGOODS) ×2
IMMOBILIZER KNEE 20 THIGH 36 (SOFTGOODS) ×1 IMPLANT
KIT TURNOVER KIT A (KITS) ×2 IMPLANT
MANIFOLD NEPTUNE II (INSTRUMENTS) ×2 IMPLANT
NS IRRIG 1000ML POUR BTL (IV SOLUTION) ×2 IMPLANT
PACK TOTAL KNEE CUSTOM (KITS) ×2 IMPLANT
PADDING CAST COTTON 6X4 STRL (CAST SUPPLIES) ×2 IMPLANT
PIN DRILL FIX HALF THREAD (BIT) ×2 IMPLANT
PIN STEINMAN FIXATION KNEE (PIN) ×2 IMPLANT
PROTECTOR NERVE ULNAR (MISCELLANEOUS) ×2 IMPLANT
SET HNDPC FAN SPRY TIP SCT (DISPOSABLE) ×1 IMPLANT
STRIP CLOSURE SKIN 1/2X4 (GAUZE/BANDAGES/DRESSINGS) ×4 IMPLANT
SUT MNCRL AB 4-0 PS2 18 (SUTURE) ×2 IMPLANT
SUT STRATAFIX 0 PDS 27 VIOLET (SUTURE) ×2
SUT STRATAFIX 1PDS 45CM VIOLET (SUTURE) ×2 IMPLANT
SUT STRATAFIX SPIRAL PDS+ 70CM (SUTURE) ×2
SUT VIC AB 2-0 CT1 27 (SUTURE) ×6
SUT VIC AB 2-0 CT1 TAPERPNT 27 (SUTURE) ×3 IMPLANT
SUTURE STRATFX 0 PDS 27 VIOLET (SUTURE) ×1 IMPLANT
SUTURE STRATFX SPIRL PDS+ 70CM (SUTURE) ×1 IMPLANT
TIBIAL BASE ROT PLAT SZ 5 KNEE (Knees) ×2 IMPLANT
TRAY FOLEY MTR SLVR 16FR STAT (SET/KITS/TRAYS/PACK) ×2 IMPLANT
TUBE SUCTION HIGH CAP CLEAR NV (SUCTIONS) ×2 IMPLANT
WATER STERILE IRR 1000ML POUR (IV SOLUTION) ×4 IMPLANT
WRAP KNEE MAXI GEL POST OP (GAUZE/BANDAGES/DRESSINGS) ×2 IMPLANT

## 2020-12-01 NOTE — Transfer of Care (Signed)
Immediate Anesthesia Transfer of Care Note  Patient: Ashley York  Procedure(s) Performed: TOTAL KNEE ARTHROPLASTY (Left: Knee)  Patient Location: PACU  Anesthesia Type:Spinal  Level of Consciousness: drowsy and responds to stimulation  Airway & Oxygen Therapy: Patient Spontanous Breathing and Patient connected to face mask oxygen  Post-op Assessment: Report given to RN and Post -op Vital signs reviewed and stable  Post vital signs: Reviewed and stable  Last Vitals:  Vitals Value Taken Time  BP 123/67 12/01/20 0900  Temp    Pulse 74 12/01/20 0901  Resp 19 12/01/20 0901  SpO2 100 % 12/01/20 0901  Vitals shown include unvalidated device data.  Last Pain:  Vitals:   12/01/20 0550  TempSrc:   PainSc: 0-No pain      Patients Stated Pain Goal: 5 (12/01/20 0550)  Complications: No notable events documented.

## 2020-12-01 NOTE — Interval H&P Note (Signed)
History and Physical Interval Note:  12/01/2020 6:18 AM  Ashley York  has presented today for surgery, with the diagnosis of left knee osteoarthritis.  The various methods of treatment have been discussed with the patient and family. After consideration of risks, benefits and other options for treatment, the patient has consented to  Procedure(s) with comments: TOTAL KNEE ARTHROPLASTY (Left) - as a surgical intervention.  The patient's history has been reviewed, patient examined, no change in status, stable for surgery.  I have reviewed the patient's chart and labs.  Questions were answered to the patient's satisfaction.     Ashley York

## 2020-12-01 NOTE — Anesthesia Procedure Notes (Signed)
Anesthesia Regional Block: Adductor canal block   Pre-Anesthetic Checklist: , timeout performed,  Correct Patient, Correct Site, Correct Laterality,  Correct Procedure, Correct Position, site marked,  Risks and benefits discussed,  Surgical consent,  Pre-op evaluation,  At surgeon's request and post-op pain management  Laterality: Left  Prep: Dura Prep       Needles:  Injection technique: Single-shot  Needle Type: Echogenic Stimulator Needle     Needle Length: 10cm  Needle Gauge: 20     Additional Needles:   Procedures:,,,, ultrasound used (permanent image in chart),,    Narrative:  Start time: 12/01/2020 6:46 AM End time: 12/01/2020 6:50 AM Injection made incrementally with aspirations every 5 mL.  Performed by: Personally  Anesthesiologist: Atilano Median, DO  Additional Notes: Patient identified. Risks/Benefits/Options discussed with patient including but not limited to bleeding, infection, nerve damage, failed block, incomplete pain control. Patient expressed understanding and wished to proceed. All questions were answered. Sterile technique was used throughout the entire procedure. Please see nursing notes for vital signs. Aspirated in 5cc intervals with injection for negative confirmation. Patient was given instructions on fall risk and not to get out of bed. All questions and concerns addressed with instructions to call with any issues or inadequate analgesia.

## 2020-12-01 NOTE — Anesthesia Procedure Notes (Signed)
Spinal  Patient location during procedure: OR Start time: 12/01/2020 7:24 AM End time: 12/01/2020 7:27 AM Staffing Performed: anesthesiologist  Anesthesiologist: Atilano Median, DO Preanesthetic Checklist Completed: patient identified, IV checked, site marked, risks and benefits discussed, surgical consent, monitors and equipment checked, pre-op evaluation and timeout performed Spinal Block Patient position: sitting Prep: DuraPrep Patient monitoring: heart rate, cardiac monitor, continuous pulse ox and blood pressure Approach: midline Location: L4-5 Injection technique: single-shot Needle Needle type: Pencan  Needle gauge: 24 G Needle length: 10 cm Assessment Events: CSF return Additional Notes Patient identified. Risks/Benefits/Options discussed with patient including but not limited to bleeding, infection, nerve damage, paralysis, failed block, incomplete pain control, headache, blood pressure changes, nausea, vomiting, reactions to medications, itching and postpartum back pain. Confirmed with bedside nurse the patient's most recent platelet count. Confirmed with patient that they are not currently taking any anticoagulation, have any bleeding history or any family history of bleeding disorders. Patient expressed understanding and wished to proceed. All questions were answered. Sterile technique was used throughout the entire procedure. Please see nursing notes for vital signs. Warning signs of high block given to the patient including shortness of breath, tingling/numbness in hands, complete motor block, or any concerning symptoms with instructions to call for help. Patient was given instructions on fall risk and not to get out of bed. All questions and concerns addressed with instructions to call with any issues or inadequate analgesia.

## 2020-12-01 NOTE — Progress Notes (Signed)
Orthopedic Tech Progress Note Patient Details:  Ashley York Jul 22, 1955 [id number]  Patient ID: Ashley York, female   DOB: April 28, 1955, 65 y.o.   MRN: [id number]  Ashley York 12/01/2020, 1:24 PM Cpm removed from patient

## 2020-12-01 NOTE — Evaluation (Signed)
Physical Therapy Evaluation Patient Details Name: Ashley York MRN: [id number] DOB: 1955-09-23 Today's Date: 12/01/2020  History of Present Illness  Patient is 65 y.o. female s/p Lt TKA on 12/01/20 with PMH significant for OA, GERD, HLD, HTN, hypothyroidism.   Clinical Impression  Ashley York is a 65 y.o. female POD 0 s/p Lt TKA. Patient reports independence with mobility at baseline. Patient is now limited by functional impairments (see PT problem list below) and requires min assist for transfers and gait with RW. Patient was able to ambulate ~45 feet with RW and min assist. Patient instructed in exercise to facilitate circulation to manage edema and reduce risk of DVT. Patient will benefit from continued skilled PT interventions to address impairments and progress towards PLOF. Acute PT will follow to progress mobility and stair training in preparation for safe discharge home.        Recommendations for follow up therapy are one component of a multi-disciplinary discharge planning process, led by the attending physician.  Recommendations may be updated based on patient status, additional functional criteria and insurance authorization.  Follow Up Recommendations Follow surgeon's recommendation for DC plan and follow-up therapies    Equipment Recommendations  None recommended by PT    Recommendations for Other Services       Precautions / Restrictions Precautions Precautions: Fall Restrictions Weight Bearing Restrictions: No LLE Weight Bearing: Weight bearing as tolerated      Mobility  Bed Mobility Overal bed mobility: Needs Assistance Bed Mobility: Supine to Sit     Supine to sit: Min assist;HOB elevated     General bed mobility comments: cues to use bed rail, light assist for Lt LE off EOB.    Transfers Overall transfer level: Needs assistance Equipment used: Rolling walker (2 wheeled) Transfers: Sit to/from Stand Sit to Stand: Min assist;From elevated  surface         General transfer comment: cues for technique/hand placement on EOB for power up, min assist to rise and steady.  Ambulation/Gait Ambulation/Gait assistance: Min assist Gait Distance (Feet): 45 Feet Assistive device: Rolling walker (2 wheeled) Gait Pattern/deviations: Step-to pattern;Decreased stride length Gait velocity: decr   General Gait Details: cues for safe step pattern/proximity to RW. intermittent assist to maintain safe walker position.  Stairs            Wheelchair Mobility    Modified Rankin (Stroke Patients Only)       Balance Overall balance assessment: Needs assistance Sitting-balance support: Feet supported Sitting balance-Leahy Scale: Good     Standing balance support: During functional activity;Bilateral upper extremity supported Standing balance-Leahy Scale: Fair                               Pertinent Vitals/Pain Pain Assessment: No/denies pain    Home Living Family/patient expects to be discharged to:: Private residence Living Arrangements: Spouse/significant other Available Help at Discharge: Family Type of Home: House Home Access: Stairs to enter Entrance Stairs-Rails: None (Rt side deck rails/rungs) Entrance Stairs-Number of Steps: 8 Home Layout: Multi-level Home Equipment: Walker - 2 wheels;Cane - single point;Bedside commode;Shower seat      Prior Function Level of Independence: Independent               Hand Dominance   Dominant Hand: Right    Extremity/Trunk Assessment   Upper Extremity Assessment Upper Extremity Assessment: Overall WFL for tasks assessed    Lower Extremity Assessment Lower Extremity Assessment:  LLE deficits/detail LLE Deficits / Details: good quad activation, no extensor lag with SLR LLE Sensation: WNL LLE Coordination: WNL    Cervical / Trunk Assessment Cervical / Trunk Assessment: Normal  Communication   Communication: No difficulties  Cognition  Arousal/Alertness: Awake/alert Behavior During Therapy: WFL for tasks assessed/performed Overall Cognitive Status: Within Functional Limits for tasks assessed                                        General Comments      Exercises Total Joint Exercises Ankle Circles/Pumps: AROM;Both;20 reps;Seated   Assessment/Plan    PT Assessment Patient needs continued PT services  PT Problem List Decreased strength;Decreased range of motion;Decreased activity tolerance;Decreased balance;Decreased mobility;Decreased knowledge of use of DME;Decreased knowledge of precautions       PT Treatment Interventions DME instruction;Gait training;Stair training;Functional mobility training;Therapeutic activities;Therapeutic exercise;Balance training;Patient/family education    PT Goals (Current goals can be found in the Care Plan section)  Acute Rehab PT Goals Patient Stated Goal: regain independence PT Goal Formulation: With patient Time For Goal Achievement: 12/08/20 Potential to Achieve Goals: Good    Frequency 7X/week   Barriers to discharge        Co-evaluation               AM-PAC PT "6 Clicks" Mobility  Outcome Measure Help needed turning from your back to your side while in a flat bed without using bedrails?: None Help needed moving from lying on your back to sitting on the side of a flat bed without using bedrails?: A Little Help needed moving to and from a bed to a chair (including a wheelchair)?: A Little Help needed standing up from a chair using your arms (e.g., wheelchair or bedside chair)?: A Little Help needed to walk in hospital room?: A Little Help needed climbing 3-5 steps with a railing? : A Little 6 Click Score: 19    End of Session Equipment Utilized During Treatment: Gait belt Activity Tolerance: Patient tolerated treatment well Patient left: in chair;with call bell/phone within reach;with chair alarm set;with family/visitor present Nurse  Communication: Mobility status PT Visit Diagnosis: Muscle weakness (generalized) (M62.81);Difficulty in walking, not elsewhere classified (R26.2)    Time: 1353-1420 PT Time Calculation (min) (ACUTE ONLY): 27 min   Charges:   PT Evaluation $PT Eval Low Complexity: 1 Low PT Treatments $Gait Training: 8-22 mins        Wynn Maudlin, DPT Acute Rehabilitation Services Office [phone] Pager [phone]   Anitra Lauth 12/01/2020, 5:12 PM

## 2020-12-01 NOTE — Progress Notes (Signed)
Orthopedic Tech Progress Note Patient Details:  MIDA CORY 15-Sep-1955 [id number]  Patient ID: Ashley York, female   DOB: February 13, 1956, 65 y.o.   MRN: [id number]  [name] 12/01/2020, 9:24 AM Cpm applied in pacu

## 2020-12-01 NOTE — Care Plan (Signed)
Ortho Bundle Case Management Note  Patient Details  Name: Ashley York MRN: [id number] Date of Birth: 04-26-1955                  L TKA on 12/01/20. DCP: Home with husband. 2 story home with 8 steps. DME: No needs. Has RW & 3in1. PT: EO 9/15   DME Arranged:  N/A DME Agency:     HH Arranged:    HH Agency:     Additional Comments: Please contact me with any questions of if this plan should need to change.  Ennis Forts, RN,CCM [facility]  [phone] 12/01/2020, 11:22 AM

## 2020-12-01 NOTE — Discharge Instructions (Addendum)
 Frank Aluisio, MD Total Joint Specialist [facility] [place] [address] [place], [place] [address] [phone]  TOTAL KNEE REPLACEMENT POSTOPERATIVE DIRECTIONS    Knee Rehabilitation, Guidelines Following Surgery  Results after knee surgery are often greatly improved when you follow the exercise, range of motion and muscle strengthening exercises prescribed by your doctor. Safety measures are also important to protect the knee from further injury. If any of these exercises cause you to have increased pain or swelling in your knee joint, decrease the amount until you are comfortable again and slowly increase them. If you have problems or questions, call your caregiver or physical therapist for advice.   BLOOD CLOT PREVENTION Take a 325 mg Aspirin two times a day for three weeks following surgery. Then take an 81 mg Aspirin once a day for three weeks. Then discontinue Aspirin. You may resume your vitamins/supplements upon discharge from the hospital. Do not take any NSAIDs (Advil, Aleve, Ibuprofen, Meloxicam, etc.) until you have discontinued the 325 mg Aspirin.  HOME CARE INSTRUCTIONS  Remove items at home which could result in a fall. This includes throw rugs or furniture in walking pathways.  ICE to the affected knee as much as tolerated. Icing helps control swelling. If the swelling is well controlled you will be more comfortable and rehab easier. Continue to use ice on the knee for pain and swelling from surgery. You may notice swelling that will progress down to the foot and ankle. This is normal after surgery. Elevate the leg when you are not up walking on it.    Continue to use the breathing machine which will help keep your temperature down. It is common for your temperature to cycle up and down following surgery, especially at night when you are not up moving around and exerting yourself. The breathing machine keeps your lungs expanded and your temperature  down. Do not place pillow under the operative knee, focus on keeping the knee straight while resting  DIET You may resume your previous home diet once you are discharged from the hospital.  DRESSING / WOUND CARE / SHOWERING Keep your bulky bandage on for 2 days. On the third post-operative day you may remove the Ace bandage and gauze. There is a waterproof adhesive bandage on your skin which will stay in place until your first follow-up appointment. Once you remove this you will not need to place another bandage You may begin showering 3 days following surgery, but do not submerge the incision under water.  ACTIVITY For the first 5 days, the key is rest and control of pain and swelling Do your home exercises twice a day starting on post-operative day 3. On the days you go to physical therapy, just do the home exercises once that day. You should rest, ice and elevate the leg for 50 minutes out of every hour. Get up and walk/stretch for 10 minutes per hour. After 5 days you can increase your activity slowly as tolerated. Walk with your walker as instructed. Use the walker until you are comfortable transitioning to a cane. Walk with the cane in the opposite hand of the operative leg. You may discontinue the cane once you are comfortable and walking steadily. Avoid periods of inactivity such as sitting longer than an hour when not asleep. This helps prevent blood clots.  You may discontinue the knee immobilizer once you are able to perform a straight leg raise while lying down. You may resume a sexual relationship in one month   or when given the OK by your doctor.  You may return to work once you are cleared by your doctor.  Do not drive a car for 6 weeks or until released by your surgeon.  Do not drive while taking narcotics.  TED HOSE STOCKINGS Wear the elastic stockings on both legs for three weeks following surgery during the day. You may remove them at night for sleeping.  WEIGHT  BEARING Weight bearing as tolerated with assist device (walker, cane, etc) as directed, use it as long as suggested by your surgeon or therapist, typically at least 4-6 weeks.  POSTOPERATIVE CONSTIPATION PROTOCOL Constipation - defined medically as fewer than three stools per week and severe constipation as less than one stool per week.  One of the most common issues patients have following surgery is constipation.  Even if you have a regular bowel pattern at home, your normal regimen is likely to be disrupted due to multiple reasons following surgery.  Combination of anesthesia, postoperative narcotics, change in appetite and fluid intake all can affect your bowels.  In order to avoid complications following surgery, here are some recommendations in order to help you during your recovery period.  Colace (docusate) - Pick up an over-the-counter form of Colace or another stool softener and take twice a day as long as you are requiring postoperative pain medications.  Take with a full glass of water daily.  If you experience loose stools or diarrhea, hold the colace until you stool forms back up. If your symptoms do not get better within 1 week or if they get worse, check with your doctor. Dulcolax (bisacodyl) - Pick up over-the-counter and take as directed by the product packaging as needed to assist with the movement of your bowels.  Take with a full glass of water.  Use this product as needed if not relieved by Colace only.  MiraLax (polyethylene glycol) - Pick up over-the-counter to have on hand. MiraLax is a solution that will increase the amount of water in your bowels to assist with bowel movements.  Take as directed and can mix with a glass of water, juice, soda, coffee, or tea. Take if you go more than two days without a movement. Do not use MiraLax more than once per day. Call your doctor if you are still constipated or irregular after using this medication for 7 days in a row.  If you continue  to have problems with postoperative constipation, please contact the office for further assistance and recommendations.  If you experience "the worst abdominal pain ever" or develop nausea or vomiting, please contact the office immediatly for further recommendations for treatment.  ITCHING If you experience itching with your medications, try taking only a single pain pill, or even half a pain pill at a time.  You can also use Benadryl over the counter for itching or also to help with sleep.   MEDICATIONS See your medication summary on the "After Visit Summary" that the nursing staff will review with you prior to discharge.  You may have some home medications which will be placed on hold until you complete the course of blood thinner medication.  It is important for you to complete the blood thinner medication as prescribed by your surgeon.  Continue your approved medications as instructed at time of discharge.  PRECAUTIONS If you experience chest pain or shortness of breath - call 911 immediately for transfer to the hospital emergency department.  If you develop a fever greater that 101 F,   purulent drainage from wound, increased redness or drainage from wound, foul odor from the wound/dressing, or calf pain - CONTACT YOUR SURGEON.                                                   FOLLOW-UP APPOINTMENTS Make sure you keep all of your appointments after your operation with your surgeon and caregivers. You should call the office at the above phone number and make an appointment for approximately two weeks after the date of your surgery or on the date instructed by your surgeon outlined in the "After Visit Summary".  RANGE OF MOTION AND STRENGTHENING EXERCISES  Rehabilitation of the knee is important following a knee injury or an operation. After just a few days of immobilization, the muscles of the thigh which control the knee become weakened and shrink (atrophy). Knee exercises are designed to build up  the tone and strength of the thigh muscles and to improve knee motion. Often times heat used for twenty to thirty minutes before working out will loosen up your tissues and help with improving the range of motion but do not use heat for the first two weeks following surgery. These exercises can be done on a training (exercise) mat, on the floor, on a table or on a bed. Use what ever works the best and is most comfortable for you Knee exercises include:  Leg Lifts - While your knee is still immobilized in a splint or cast, you can do straight leg raises. Lift the leg to 60 degrees, hold for 3 sec, and slowly lower the leg. Repeat 10-20 times 2-3 times daily. Perform this exercise against resistance later as your knee gets better.  Quad and Hamstring Sets - Tighten up the muscle on the front of the thigh (Quad) and hold for 5-10 sec. Repeat this 10-20 times hourly. Hamstring sets are done by pushing the foot backward against an object and holding for 5-10 sec. Repeat as with quad sets.  Leg Slides: Lying on your back, slowly slide your foot toward your buttocks, bending your knee up off the floor (only go as far as is comfortable). Then slowly slide your foot back down until your leg is flat on the floor again. Angel Wings: Lying on your back spread your legs to the side as far apart as you can without causing discomfort.  A rehabilitation program following serious knee injuries can speed recovery and prevent re-injury in the future due to weakened muscles. Contact your doctor or a physical therapist for more information on knee rehabilitation.   POST-OPERATIVE OPIOID TAPER INSTRUCTIONS: It is important to wean off of your opioid medication as soon as possible. If you do not need pain medication after your surgery it is ok to stop day one. Opioids include: Codeine, Hydrocodone(Norco, Vicodin), Oxycodone(Percocet, oxycontin) and hydromorphone amongst others.  Long term and even short term use of opiods can  cause: Increased pain response Dependence Constipation Depression Respiratory depression And more.  Withdrawal symptoms can include Flu like symptoms Nausea, vomiting And more Techniques to manage these symptoms Hydrate well Eat regular healthy meals Stay active Use relaxation techniques(deep breathing, meditating, yoga) Do Not substitute Alcohol to help with tapering If you have been on opioids for less than two weeks and do not have pain than it is ok to stop all together.  Plan   to wean off of opioids This plan should start within one week post op of your joint replacement. Maintain the same interval or time between taking each dose and first decrease the dose.  Cut the total daily intake of opioids by one tablet each day Next start to increase the time between doses. The last dose that should be eliminated is the evening dose.   IF YOU ARE TRANSFERRED TO A SKILLED REHAB FACILITY If the patient is transferred to a skilled rehab facility following release from the hospital, a list of the current medications will be sent to the facility for the patient to continue.  When discharged from the skilled rehab facility, please have the facility set up the patient's Home Health Physical Therapy prior to being released. Also, the skilled facility will be responsible for providing the patient with their medications at time of release from the facility to include their pain medication, the muscle relaxants, and their blood thinner medication. If the patient is still at the rehab facility at time of the two week follow up appointment, the skilled rehab facility will also need to assist the patient in arranging follow up appointment in our office and any transportation needs.  MAKE SURE YOU:  Understand these instructions.  Get help right away if you are not doing well or get worse.   DENTAL ANTIBIOTICS:  In most cases prophylactic antibiotics for Dental procdeures after total joint surgery are  not necessary.  Exceptions are as follows:  1. History of prior total joint infection  2. Severely immunocompromised (Organ Transplant, cancer chemotherapy, Rheumatoid biologic meds such as Humera)  3. Poorly controlled diabetes (A1C &gt; 8.0, blood glucose over 200)  If you have one of these conditions, contact your surgeon for an antibiotic prescription, prior to your dental procedure.    Pick up stool softner and laxative for home use following surgery while on pain medications. Do not submerge incision under water. Please use good hand washing techniques while changing dressing each day. May shower starting three days after surgery. Please use a clean towel to pat the incision dry following showers. Continue to use ice for pain and swelling after surgery. Do not use any lotions or creams on the incision until instructed by your surgeon.  

## 2020-12-01 NOTE — Anesthesia Postprocedure Evaluation (Signed)
Anesthesia Post Note  Patient: Ashley York  Procedure(s) Performed: TOTAL KNEE ARTHROPLASTY (Left: Knee)     Patient location during evaluation: PACU Anesthesia Type: Regional, MAC and Spinal Level of consciousness: awake and alert Pain management: pain level controlled Vital Signs Assessment: post-procedure vital signs reviewed and stable Respiratory status: spontaneous breathing, nonlabored ventilation, respiratory function stable and patient connected to nasal cannula oxygen Cardiovascular status: stable and blood pressure returned to baseline Postop Assessment: no apparent nausea or vomiting Anesthetic complications: no   No notable events documented.  Last Vitals:  Vitals:   12/01/20 1215 12/01/20 1428  BP: (!) 154/69 (!) 144/68  Pulse: 64 72  Resp: 16 16  Temp: 36.7 C 36.7 C  SpO2: 99% 96%    Last Pain:  Vitals:   12/01/20 1428  TempSrc: Oral  PainSc:                  March Rummage Tedric Leeth

## 2020-12-01 NOTE — Anesthesia Procedure Notes (Signed)
Procedure Name: MAC Date/Time: 12/01/2020 7:22 AM Performed by: Eben Burow, CRNA Pre-anesthesia Checklist: Patient identified, Emergency Drugs available, Suction available, Patient being monitored and Timeout performed Oxygen Delivery Method: Simple face mask Placement Confirmation: positive ETCO2

## 2020-12-01 NOTE — Op Note (Signed)
OPERATIVE REPORT-TOTAL KNEE ARTHROPLASTY   Pre-operative diagnosis- Osteoarthritis  Left knee(s)  Post-operative diagnosis- Osteoarthritis Left knee(s)  Procedure-  Left  Total Knee Arthroplasty  Surgeon- Gus Rankin. [place], MD  Assistant- Leilani Able, PA-C   Anesthesia-   Adductor canal block and spinal  EBL-50 mL   Drains None  Tourniquet time-  Total Tourniquet Time Documented: Thigh (Left) - 39 minutes Total: Thigh (Left) - 39 minutes     Complications- None  Condition-PACU - hemodynamically stable.   Brief Clinical Note  Ashley York is a 65 y.o. year old female with end stage OA of her left knee with progressively worsening pain and dysfunction. She has constant pain, with activity and at rest and significant functional deficits with difficulties even with ADLs. She has had extensive non-op management including analgesics, injections of cortisone and viscosupplements, and home exercise program, but remains in significant pain with significant dysfunction. Radiographs show bone on bone arthritis medial and patellofemoral. She presents now for left Total Knee Arthroplasty.     Procedure in detail---   The patient is brought into the operating room and positioned supine on the operating table. After successful administration of  Adductor canal block and spinal,   a tourniquet is placed high on the  Left thigh(s) and the lower extremity is prepped and draped in the usual sterile fashion. Time out is performed by the operating team and then the  Left lower extremity is wrapped in Esmarch, knee flexed and the tourniquet inflated to 300 mmHg.       A midline incision is made with a ten blade through the subcutaneous tissue to the level of the extensor mechanism. A fresh blade is used to make a medial parapatellar arthrotomy. Soft tissue over the proximal medial tibia is subperiosteally elevated to the joint line with a knife and into the semimembranosus bursa with a Cobb  elevator. Soft tissue over the proximal lateral tibia is elevated with attention being paid to avoiding the patellar tendon on the tibial tubercle. The patella is everted, knee flexed 90 degrees and the ACL and PCL are removed. Findings are bone on bone medial and patellofemoral with large global osteophytes.        The drill is used to create a starting hole in the distal femur and the canal is thoroughly irrigated with sterile saline to remove the fatty contents. The 5 degree Left  valgus alignment guide is placed into the femoral canal and the distal femoral cutting block is pinned to remove 9 mm off the distal femur. Resection is made with an oscillating saw.      The tibia is subluxed forward and the menisci are removed. The extramedullary alignment guide is placed referencing proximally at the medial aspect of the tibial tubercle and distally along the second metatarsal axis and tibial crest. The block is pinned to remove 77mm off the more deficient medial  side. Resection is made with an oscillating saw. Size 5is the most appropriate size for the tibia and the proximal tibia is prepared with the modular drill and keel punch for that size.      The femoral sizing guide is placed and size 6 is most appropriate. Rotation is marked off the epicondylar axis and confirmed by creating a rectangular flexion gap at 90 degrees. The size 6 cutting block is pinned in this rotation and the anterior, posterior and chamfer cuts are made with the oscillating saw. The intercondylar block is then placed and that cut is  made.      Trial size 5 tibial component, trial size 6 narrow posterior stabilized femur and a 8  mm posterior stabilized rotating platform insert trial is placed. Full extension is achieved with excellent varus/valgus and anterior/posterior balance throughout full range of motion. The patella is everted and thickness measured to be 24  mm. Free hand resection is taken to 14 mm, a 38 template is placed, lug  holes are drilled, trial patella is placed, and it tracks normally. Osteophytes are removed off the posterior femur with the trial in place. All trials are removed and the cut bone surfaces prepared with pulsatile lavage. Cement is mixed and once ready for implantation, the size 5 tibial implant, size  6 narrow posterior stabilized femoral component, and the size 38 patella are cemented in place and the patella is held with the clamp. The trial insert is placed and the knee held in full extension. The Exparel (20 ml mixed with 60 ml saline) is injected into the extensor mechanism, posterior capsule, medial and lateral gutters and subcutaneous tissues.  All extruded cement is removed and once the cement is hard the permanent 8 mm posterior stabilized rotating platform insert is placed into the tibial tray.      The wound is copiously irrigated with saline solution and the extensor mechanism closed with # 0 Stratofix suture. The tourniquet is released for a total tourniquet time of 39  minutes. Flexion against gravity is 130 degrees and the patella tracks normally. Subcutaneous tissue is closed with 2.0 vicryl and subcuticular with running 4.0 Monocryl. The incision is cleaned and dried and steri-strips and a bulky sterile dressing are applied. The limb is placed into a knee immobilizer and the patient is awakened and transported to recovery in stable condition.      Please note that a surgical assistant was a medical necessity for this procedure in order to perform it in a safe and expeditious manner. Surgical assistant was necessary to retract the ligaments and vital neurovascular structures to prevent injury to them and also necessary for proper positioning of the limb to allow for anatomic placement of the prosthesis.   Gus Rankin Danecia Underdown, MD    12/01/2020, 8:32 AM

## 2020-12-02 DIAGNOSIS — I10 Essential (primary) hypertension: Secondary | ICD-10-CM | POA: Diagnosis not present

## 2020-12-02 DIAGNOSIS — E039 Hypothyroidism, unspecified: Secondary | ICD-10-CM | POA: Diagnosis not present

## 2020-12-02 DIAGNOSIS — Z87891 Personal history of nicotine dependence: Secondary | ICD-10-CM | POA: Diagnosis not present

## 2020-12-02 DIAGNOSIS — M1712 Unilateral primary osteoarthritis, left knee: Secondary | ICD-10-CM | POA: Diagnosis not present

## 2020-12-02 DIAGNOSIS — Z79899 Other long term (current) drug therapy: Secondary | ICD-10-CM | POA: Diagnosis not present

## 2020-12-02 DIAGNOSIS — Z882 Allergy status to sulfonamides status: Secondary | ICD-10-CM | POA: Diagnosis not present

## 2020-12-02 DIAGNOSIS — K219 Gastro-esophageal reflux disease without esophagitis: Secondary | ICD-10-CM | POA: Diagnosis not present

## 2020-12-02 LAB — CBC
HCT: 31 % — ABNORMAL LOW (ref 36.0–46.0)
Hemoglobin: 9.7 g/dL — ABNORMAL LOW (ref 12.0–15.0)
MCH: 26.1 pg (ref 26.0–34.0)
MCHC: 31.3 g/dL (ref 30.0–36.0)
MCV: 83.3 fL (ref 80.0–100.0)
Platelets: 211 10*3/uL (ref 150–400)
RBC: 3.72 MIL/uL — ABNORMAL LOW (ref 3.87–5.11)
RDW: 15.3 % (ref 11.5–15.5)
WBC: 12.1 10*3/uL — ABNORMAL HIGH (ref 4.0–10.5)
nRBC: 0 % (ref 0.0–0.2)

## 2020-12-02 LAB — BASIC METABOLIC PANEL
Anion gap: 9 (ref 5–15)
BUN: 19 mg/dL (ref 8–23)
CO2: 26 mmol/L (ref 22–32)
Calcium: 9 mg/dL (ref 8.9–10.3)
Chloride: 106 mmol/L (ref 98–111)
Creatinine, Ser: 0.7 mg/dL (ref 0.44–1.00)
GFR, Estimated: >60 mL/min (ref >60)
Glucose, Bld: 158 mg/dL — ABNORMAL HIGH (ref 70–99)
Potassium: 3.7 mmol/L (ref 3.5–5.1)
Sodium: 141 mmol/L (ref 135–145)

## 2020-12-02 MED ORDER — GABAPENTIN 300 MG PO CAPS: ORAL_CAPSULE | ORAL | 0 refills | Status: DC

## 2020-12-02 MED ORDER — METHOCARBAMOL 500 MG PO TABS: 500.0000 mg | ORAL_TABLET | Freq: Four times a day (QID) | ORAL | 0 refills | Status: DC | PRN

## 2020-12-02 MED ORDER — ASPIRIN 325 MG PO TBEC: 325.0000 mg | DELAYED_RELEASE_TABLET | Freq: Two times a day (BID) | ORAL | 0 refills | Status: DC

## 2020-12-02 MED ORDER — TRAMADOL HCL 50 MG PO TABS: 50.0000 mg | ORAL_TABLET | Freq: Four times a day (QID) | ORAL | 0 refills | Status: DC | PRN

## 2020-12-02 MED ORDER — OXYCODONE HCL 5 MG PO TABS: 5.0000 mg | ORAL_TABLET | Freq: Four times a day (QID) | ORAL | 0 refills | Status: DC | PRN

## 2020-12-02 NOTE — Progress Notes (Signed)
Physical Therapy Treatment Patient Details Name: Ashley York MRN: [id number] DOB: 1955-08-14 Today's Date: 12/02/2020   History of Present Illness Patient is 65 y.o. female s/p Lt TKA on 12/01/20 with PMH significant for OA, GERD, HLD, HTN, hypothyroidism.    PT Comments    Pt assisted with performing LE exercises and ambulated in hallway.  Pt will practice stairs this afternoon in preparation for d/c home.    Recommendations for follow up therapy are one component of a multi-disciplinary discharge planning process, led by the attending physician.  Recommendations may be updated based on patient status, additional functional criteria and insurance authorization.  Follow Up Recommendations  Follow surgeon's recommendation for DC plan and follow-up therapies     Equipment Recommendations  None recommended by PT    Recommendations for Other Services       Precautions / Restrictions Precautions Precautions: Fall;Knee Restrictions Weight Bearing Restrictions: No LLE Weight Bearing: Weight bearing as tolerated     Mobility  Bed Mobility               General bed mobility comments: pt in recliner on arrival    Transfers Overall transfer level: Needs assistance Equipment used: Rolling walker (2 wheeled) Transfers: Sit to/from Stand Sit to Stand: Min assist         General transfer comment: cues for technique/hand placement, pt with wide BOS to rise from recliner surface and required min assist for steadying  Ambulation/Gait Ambulation/Gait assistance: Min guard Gait Distance (Feet): 160 Feet Assistive device: Rolling walker (2 wheeled) Gait Pattern/deviations: Step-to pattern;Decreased stride length;Antalgic Gait velocity: decr   General Gait Details: verbal cues for sequence, RW positioning, step length, posture   Stairs             Wheelchair Mobility    Modified Rankin (Stroke Patients Only)       Balance                                             Cognition Arousal/Alertness: Awake/alert Behavior During Therapy: WFL for tasks assessed/performed Overall Cognitive Status: Within Functional Limits for tasks assessed                                        Exercises Total Joint Exercises Ankle Circles/Pumps: AROM;Both;10 reps Quad Sets: AROM;Both;10 reps Short Arc Quad: AROM;Left;10 reps Heel Slides: AAROM;Left;10 reps Hip ABduction/ADduction: AROM;Left;10 reps Straight Leg Raises: AAROM;Left;10 reps Goniometric ROM: approx 35* AAROM left knee flexion    General Comments        Pertinent Vitals/Pain Pain Assessment: 0-10 Pain Score: 4  Pain Location: left knee Pain Descriptors / Indicators: Aching;Sore Pain Intervention(s): Repositioned;Monitored during session;Premedicated before session    Home Living                      Prior Function            PT Goals (current goals can now be found in the care plan section) Progress towards PT goals: Progressing toward goals    Frequency    7X/week      PT Plan Current plan remains appropriate    Co-evaluation              AM-PAC PT "6 Clicks" Mobility  Outcome Measure  Help needed turning from your back to your side while in a flat bed without using bedrails?: None Help needed moving from lying on your back to sitting on the side of a flat bed without using bedrails?: A Little Help needed moving to and from a bed to a chair (including a wheelchair)?: A Little Help needed standing up from a chair using your arms (e.g., wheelchair or bedside chair)?: A Little Help needed to walk in hospital room?: A Little Help needed climbing 3-5 steps with a railing? : A Little 6 Click Score: 19    End of Session Equipment Utilized During Treatment: Gait belt Activity Tolerance: Patient tolerated treatment well Patient left: in chair;with call bell/phone within reach;with chair alarm set   PT Visit Diagnosis:  Muscle weakness (generalized) (M62.81);Difficulty in walking, not elsewhere classified (R26.2)     Time: 6606-3016 PT Time Calculation (min) (ACUTE ONLY): 23 min  Charges:  $Gait Training: 8-22 mins $Therapeutic Exercise: 8-22 mins                    Thomasene Mohair PT, DPT Acute Rehabilitation Services Pager: [phone] Office: [phone]    Janan Halter [place] 12/02/2020, 11:50 AM

## 2020-12-02 NOTE — Progress Notes (Signed)
Subjective: 1 Day Post-Op Procedure(s) (LRB): TOTAL KNEE ARTHROPLASTY (Left) Patient reports pain as mild.   Patient seen in rounds by Dr. Lequita Halt. Patient is well, and has had no acute complaints or problems. Denies SOB, chest pain, or calf pain. No acute overnight events. Ambulated 45 feet with therapy yesterday. Will continue therapy today.    Objective: Vital signs in last 24 hours: Temp:  [97.6 F (36.4 C)-98.7 F (37.1 C)] 97.8 F (36.6 C) (09/13 0527) Pulse Rate:  [42-74] 68 (09/13 0527) Resp:  [14-17] 17 (09/13 0527) BP: (142-173)/(66-88) 169/75 (09/13 0527) SpO2:  [94 %-100 %] 96 % (09/13 0527)  Intake/Output from previous day:  Intake/Output Summary (Last 24 hours) at 12/02/2020 0939 Last data filed at 12/02/2020 5621 Gross per 24 hour  Intake 3092.87 ml  Output 4200 ml  Net -1107.13 ml     Intake/Output this shift: Total I/O In: -  Out: 200 [Urine:200]  Labs: Recent Labs    12/02/20 0304  HGB 9.7*   Recent Labs    12/02/20 0304  WBC 12.1*  RBC 3.72*  HCT 31.0*  PLT 211   Recent Labs    12/02/20 0304  NA 141  K 3.7  CL 106  CO2 26  BUN 19  CREATININE 0.70  GLUCOSE 158*  CALCIUM 9.0   No results for input(s): LABPT, INR in the last 72 hours.  Exam: General - Patient is Alert and Oriented Extremity - Neurologically intact Neurovascular intact Intact pulses distally Dorsiflexion/Plantar flexion intact Dressing - dressing C/D/I Motor Function - intact, moving foot and toes well on exam.   Past Medical History:  Diagnosis Date   Allergy    Cardiomegaly    CHOLELITHIASIS 05/10/2008   Qualifier: Diagnosis of  By: Nehemiah Massed RN, Elnita Maxwell     CONSTIPATION 04/23/2008   Qualifier: Diagnosis of  By: Marina Goodell MD, Wilhemina Bonito    Dyspnea on exertion    Gallstones    GERD (gastroesophageal reflux disease)    Heart murmur    HEMORRHOIDS, INTERNAL 04/19/2008   Qualifier: Diagnosis of  By: Alesia Richards     Hyperlipidemia    Hypertension     HYPERTENSION 04/19/2008   Qualifier: Diagnosis of  By: Alesia Richards     Hypothyroidism    HYPOTHYROIDISM 04/19/2008   Qualifier: Diagnosis of  By: Alesia Richards     Osteoarthritis    Overweight    Pre-diabetes    Sleep apnea    cpap    Assessment/Plan: 1 Day Post-Op Procedure(s) (LRB): TOTAL KNEE ARTHROPLASTY (Left) Principal Problem:   OA (osteoarthritis) of knee  Estimated body mass index is 38.58 kg/m as calculated from the following:   Height as of this encounter: 5\' 6"  (1.676 m).   Weight as of this encounter: 108.4 kg. Advance diet Up with therapy   Patient's anticipated LOS is less than 2 midnights, meeting these requirements: - Younger than 59 - Lives within 1 hour of care - Has a competent adult at home to recover with post-op recover - NO history of  - Chronic pain requiring opiods  - Diabetes  - Coronary Artery Disease  - Heart failure  - Heart attack  - Stroke  - DVT/VTE  - Cardiac arrhythmia  - Respiratory Failure/COPD  - Renal failure  - Anemia  - Advanced Liver disease     DVT Prophylaxis - Aspirin and TED hose Weight bearing as tolerated. Continue therapy.  Plan is to go Home after hospital stay.  Plan for two sessions with PT this morning, and if meeting goals, will plan for discharge this afternoon.   Patient to follow up in two weeks with Dr. Lequita Halt in clinic.   The PDMP database was reviewed today prior to any opioid medications being prescribed to this patient.Nelia Shi, MBA, PA-C Orthopedic Surgery 12/02/2020, 9:39 AM

## 2020-12-02 NOTE — TOC Transition Note (Signed)
Transition of Care [facility]) - CM/SW Discharge Note  Patient Details  Name: Ashley York MRN: [id number] Date of Birth: October 08, 1955  [facility] CM/SW Contact:  Sherie Don, LCSW Phone Number: 12/02/2020, 11:27 AM  Clinical Narrative: Patient is expected to discharge home after working with PT. CSW met with patient to review discharge plan. Patient will discharge home with OPPT through [facility] with the first appointment scheduled for 12/04/20. Patient has a rolling walker and 3N1, so there are no DME needs at this time. TOC signing off.  Final next level of care: OP Rehab Barriers to Discharge: No Barriers Identified  Patient Goals and CMS Choice Patient states their goals for this hospitalization and ongoing recovery are:: Discharge home with OPPT through [facility] Medicare.gov Compare Post Acute Care list provided to:: Patient Choice offered to / list presented to : NA  Discharge Plan and Services        DME Arranged: N/A DME Agency: NA  Readmission Risk Interventions No flowsheet data found.

## 2020-12-02 NOTE — Progress Notes (Signed)
Physical Therapy Treatment Patient Details Name: Ashley York MRN: [id number] DOB: 04-Jan-1956 Today's Date: 12/02/2020   History of Present Illness Patient is 65 y.o. female s/p Lt TKA on 12/01/20 with PMH significant for OA, GERD, HLD, HTN, hypothyroidism.    PT Comments    Pt assisted with ambulating in hallway and performed steps with spouse assisting holding RW.  Pt provided with stair and HEP handouts and feels ready for d/c home today.  Pt had no further questions.    Recommendations for follow up therapy are one component of a multi-disciplinary discharge planning process, led by the attending physician.  Recommendations may be updated based on patient status, additional functional criteria and insurance authorization.  Follow Up Recommendations  Follow surgeon's recommendation for DC plan and follow-up therapies     Equipment Recommendations  None recommended by PT    Recommendations for Other Services       Precautions / Restrictions Precautions Precautions: Fall;Knee Restrictions LLE Weight Bearing: Weight bearing as tolerated     Mobility  Bed Mobility               General bed mobility comments: pt in bathroom with RN on arrival    Transfers Overall transfer level: Needs assistance Equipment used: Rolling walker (2 wheeled) Transfers: Sit to/from Stand Sit to Stand: Min guard         General transfer comment: cues for technique/hand placement  Ambulation/Gait Ambulation/Gait assistance: Min guard Gait Distance (Feet): 160 Feet Assistive device: Rolling walker (2 wheeled) Gait Pattern/deviations: Step-to pattern;Decreased stride length;Antalgic Gait velocity: decr   General Gait Details: verbal cues for sequence, RW positioning, step length, posture; reported some dizziness but felt able to continue; BP 164/69 mmHg upon sitting in recliner   Stairs Stairs: Yes Stairs assistance: Min guard Stair Management: Step to  pattern;Backwards;With walker Number of Stairs: 2 General stair comments: verbal cues for sequence, RW positioning, and safety; spouse assisted with holding RW; provided handout   Wheelchair Mobility    Modified Rankin (Stroke Patients Only)       Balance                                            Cognition Arousal/Alertness: Awake/alert Behavior During Therapy: WFL for tasks assessed/performed Overall Cognitive Status: Within Functional Limits for tasks assessed                                        Exercises     General Comments        Pertinent Vitals/Pain Pain Assessment: 0-10 Pain Score: 5  Pain Location: left knee Pain Descriptors / Indicators: Aching;Sore Pain Intervention(s): Monitored during session;Repositioned    Home Living                      Prior Function            PT Goals (current goals can now be found in the care plan section) Progress towards PT goals: Progressing toward goals    Frequency    7X/week      PT Plan Current plan remains appropriate    Co-evaluation              AM-PAC PT "6 Clicks" Mobility   Outcome Measure  Help needed turning from your back to your side while in a flat bed without using bedrails?: None Help needed moving from lying on your back to sitting on the side of a flat bed without using bedrails?: A Little Help needed moving to and from a bed to a chair (including a wheelchair)?: A Little Help needed standing up from a chair using your arms (e.g., wheelchair or bedside chair)?: A Little Help needed to walk in hospital room?: A Little Help needed climbing 3-5 steps with a railing? : A Little 6 Click Score: 19    End of Session Equipment Utilized During Treatment: Gait belt Activity Tolerance: Patient tolerated treatment well Patient left: in chair;with call bell/phone within reach;with chair alarm set   PT Visit Diagnosis: Muscle weakness  (generalized) (M62.81);Difficulty in walking, not elsewhere classified (R26.2)     Time: 4709-6283 PT Time Calculation (min) (ACUTE ONLY): 16 min  Charges:  $Gait Training: 8-22 mins                    Thomasene Mohair PT, DPT Acute Rehabilitation Services Pager: [phone] Office: [phone]    Ashley York 12/02/2020, 3:33 PM

## 2020-12-03 ENCOUNTER — Encounter (HOSPITAL_COMMUNITY): Payer: Self-pay | Admitting: Orthopedic Surgery

## 2020-12-04 DIAGNOSIS — M25562 Pain in left knee: Secondary | ICD-10-CM | POA: Diagnosis not present

## 2020-12-08 ENCOUNTER — Other Ambulatory Visit (HOSPITAL_COMMUNITY): Payer: Self-pay | Admitting: Orthopedic Surgery

## 2020-12-08 ENCOUNTER — Ambulatory Visit (HOSPITAL_COMMUNITY)
Admission: RE | Admit: 2020-12-08 | Discharge: 2020-12-08 | Disposition: A | Discharge status: Home / Self Care | Payer: Medicare Other | Source: Ambulatory Visit | Attending: Internal Medicine | Admitting: Internal Medicine

## 2020-12-08 ENCOUNTER — Other Ambulatory Visit: Payer: Self-pay

## 2020-12-08 DIAGNOSIS — M79669 Pain in unspecified lower leg: Secondary | ICD-10-CM

## 2020-12-08 DIAGNOSIS — M79662 Pain in left lower leg: Secondary | ICD-10-CM

## 2020-12-08 DIAGNOSIS — M7989 Other specified soft tissue disorders: Secondary | ICD-10-CM | POA: Insufficient documentation

## 2020-12-10 DIAGNOSIS — M25562 Pain in left knee: Secondary | ICD-10-CM | POA: Diagnosis not present

## 2020-12-12 DIAGNOSIS — M25562 Pain in left knee: Secondary | ICD-10-CM | POA: Diagnosis not present

## 2020-12-15 ENCOUNTER — Ambulatory Visit
Admission: RE | Admit: 2020-12-15 | Discharge: 2020-12-15 | Disposition: A | Discharge status: Home / Self Care | Payer: Medicare Other | Source: Ambulatory Visit | Attending: Obstetrics and Gynecology | Admitting: Obstetrics and Gynecology

## 2020-12-15 ENCOUNTER — Other Ambulatory Visit: Payer: Self-pay

## 2020-12-15 DIAGNOSIS — M85852 Other specified disorders of bone density and structure, left thigh: Secondary | ICD-10-CM | POA: Diagnosis not present

## 2020-12-15 DIAGNOSIS — E2839 Other primary ovarian failure: Secondary | ICD-10-CM

## 2020-12-15 DIAGNOSIS — Z780 Asymptomatic menopausal state: Secondary | ICD-10-CM | POA: Diagnosis not present

## 2020-12-15 DIAGNOSIS — M25562 Pain in left knee: Secondary | ICD-10-CM | POA: Diagnosis not present

## 2020-12-17 DIAGNOSIS — M25562 Pain in left knee: Secondary | ICD-10-CM | POA: Diagnosis not present

## 2020-12-24 ENCOUNTER — Other Ambulatory Visit: Payer: Self-pay | Admitting: Obstetrics and Gynecology

## 2020-12-24 DIAGNOSIS — Z1231 Encounter for screening mammogram for malignant neoplasm of breast: Secondary | ICD-10-CM

## 2020-12-25 DIAGNOSIS — R7301 Impaired fasting glucose: Secondary | ICD-10-CM | POA: Diagnosis not present

## 2020-12-25 DIAGNOSIS — Z23 Encounter for immunization: Secondary | ICD-10-CM | POA: Diagnosis not present

## 2020-12-25 DIAGNOSIS — E7800 Pure hypercholesterolemia, unspecified: Secondary | ICD-10-CM | POA: Diagnosis not present

## 2020-12-25 DIAGNOSIS — Z96659 Presence of unspecified artificial knee joint: Secondary | ICD-10-CM | POA: Diagnosis not present

## 2020-12-25 DIAGNOSIS — I10 Essential (primary) hypertension: Secondary | ICD-10-CM | POA: Diagnosis not present

## 2020-12-25 DIAGNOSIS — G4730 Sleep apnea, unspecified: Secondary | ICD-10-CM | POA: Diagnosis not present

## 2020-12-25 DIAGNOSIS — Z0000 Encounter for general adult medical examination without abnormal findings: Secondary | ICD-10-CM | POA: Diagnosis not present

## 2020-12-25 DIAGNOSIS — E039 Hypothyroidism, unspecified: Secondary | ICD-10-CM | POA: Diagnosis not present

## 2020-12-25 DIAGNOSIS — E538 Deficiency of other specified B group vitamins: Secondary | ICD-10-CM | POA: Diagnosis not present

## 2020-12-25 DIAGNOSIS — R7309 Other abnormal glucose: Secondary | ICD-10-CM | POA: Diagnosis not present

## 2020-12-29 DIAGNOSIS — M25562 Pain in left knee: Secondary | ICD-10-CM | POA: Diagnosis not present

## 2021-01-02 DIAGNOSIS — M25562 Pain in left knee: Secondary | ICD-10-CM | POA: Diagnosis not present

## 2021-01-06 DIAGNOSIS — M25562 Pain in left knee: Secondary | ICD-10-CM | POA: Diagnosis not present

## 2021-01-06 DIAGNOSIS — Z96652 Presence of left artificial knee joint: Secondary | ICD-10-CM | POA: Diagnosis not present

## 2021-01-06 DIAGNOSIS — Z471 Aftercare following joint replacement surgery: Secondary | ICD-10-CM | POA: Diagnosis not present

## 2021-01-09 DIAGNOSIS — M25562 Pain in left knee: Secondary | ICD-10-CM | POA: Diagnosis not present

## 2021-01-12 DIAGNOSIS — M25562 Pain in left knee: Secondary | ICD-10-CM | POA: Diagnosis not present

## 2021-01-16 DIAGNOSIS — M25562 Pain in left knee: Secondary | ICD-10-CM | POA: Diagnosis not present

## 2021-01-21 ENCOUNTER — Ambulatory Visit
Admission: RE | Admit: 2021-01-21 | Discharge: 2021-01-21 | Disposition: A | Discharge status: Home / Self Care | Payer: Medicare Other | Source: Ambulatory Visit | Attending: Obstetrics and Gynecology | Admitting: Obstetrics and Gynecology

## 2021-01-21 ENCOUNTER — Other Ambulatory Visit: Payer: Self-pay

## 2021-01-21 DIAGNOSIS — M25562 Pain in left knee: Secondary | ICD-10-CM | POA: Diagnosis not present

## 2021-01-21 DIAGNOSIS — Z1231 Encounter for screening mammogram for malignant neoplasm of breast: Secondary | ICD-10-CM

## 2021-01-23 DIAGNOSIS — M25562 Pain in left knee: Secondary | ICD-10-CM | POA: Diagnosis not present

## 2021-01-28 DIAGNOSIS — M25562 Pain in left knee: Secondary | ICD-10-CM | POA: Diagnosis not present

## 2021-01-30 DIAGNOSIS — M25562 Pain in left knee: Secondary | ICD-10-CM | POA: Diagnosis not present

## 2021-02-03 DIAGNOSIS — M25562 Pain in left knee: Secondary | ICD-10-CM | POA: Diagnosis not present

## 2021-02-06 DIAGNOSIS — M25562 Pain in left knee: Secondary | ICD-10-CM | POA: Diagnosis not present

## 2021-02-24 DIAGNOSIS — M25562 Pain in left knee: Secondary | ICD-10-CM | POA: Diagnosis not present

## 2021-02-26 DIAGNOSIS — M25562 Pain in left knee: Secondary | ICD-10-CM | POA: Diagnosis not present

## 2021-03-10 DIAGNOSIS — M25562 Pain in left knee: Secondary | ICD-10-CM | POA: Diagnosis not present

## 2021-03-18 DIAGNOSIS — M25562 Pain in left knee: Secondary | ICD-10-CM | POA: Diagnosis not present

## 2021-03-19 DIAGNOSIS — H52223 Regular astigmatism, bilateral: Secondary | ICD-10-CM | POA: Diagnosis not present

## 2021-03-19 DIAGNOSIS — H02883 Meibomian gland dysfunction of right eye, unspecified eyelid: Secondary | ICD-10-CM | POA: Diagnosis not present

## 2021-03-19 DIAGNOSIS — H5213 Myopia, bilateral: Secondary | ICD-10-CM | POA: Diagnosis not present

## 2021-03-19 DIAGNOSIS — H40012 Open angle with borderline findings, low risk, left eye: Secondary | ICD-10-CM | POA: Diagnosis not present

## 2021-03-19 DIAGNOSIS — H02886 Meibomian gland dysfunction of left eye, unspecified eyelid: Secondary | ICD-10-CM | POA: Diagnosis not present

## 2021-03-19 DIAGNOSIS — H524 Presbyopia: Secondary | ICD-10-CM | POA: Diagnosis not present

## 2021-03-26 DIAGNOSIS — M25562 Pain in left knee: Secondary | ICD-10-CM | POA: Diagnosis not present

## 2021-03-31 DIAGNOSIS — M25562 Pain in left knee: Secondary | ICD-10-CM | POA: Diagnosis not present

## 2021-04-02 DIAGNOSIS — M25562 Pain in left knee: Secondary | ICD-10-CM | POA: Diagnosis not present

## 2021-04-07 DIAGNOSIS — M25562 Pain in left knee: Secondary | ICD-10-CM | POA: Diagnosis not present

## 2021-04-29 DIAGNOSIS — Z96653 Presence of artificial knee joint, bilateral: Secondary | ICD-10-CM | POA: Diagnosis not present

## 2021-04-29 DIAGNOSIS — M545 Low back pain, unspecified: Secondary | ICD-10-CM | POA: Diagnosis not present

## 2021-06-19 DIAGNOSIS — M5416 Radiculopathy, lumbar region: Secondary | ICD-10-CM | POA: Diagnosis not present

## 2021-06-22 DIAGNOSIS — M5416 Radiculopathy, lumbar region: Secondary | ICD-10-CM | POA: Diagnosis not present

## 2021-07-06 DIAGNOSIS — M5416 Radiculopathy, lumbar region: Secondary | ICD-10-CM | POA: Diagnosis not present

## 2021-07-13 DIAGNOSIS — M5451 Vertebrogenic low back pain: Secondary | ICD-10-CM | POA: Diagnosis not present

## 2021-07-27 DIAGNOSIS — M5451 Vertebrogenic low back pain: Secondary | ICD-10-CM | POA: Diagnosis not present

## 2021-08-06 DIAGNOSIS — M5416 Radiculopathy, lumbar region: Secondary | ICD-10-CM | POA: Diagnosis not present

## 2021-08-13 DIAGNOSIS — M5451 Vertebrogenic low back pain: Secondary | ICD-10-CM | POA: Diagnosis not present

## 2021-08-14 DIAGNOSIS — M5459 Other low back pain: Secondary | ICD-10-CM | POA: Diagnosis not present

## 2021-08-14 DIAGNOSIS — M5451 Vertebrogenic low back pain: Secondary | ICD-10-CM | POA: Diagnosis not present

## 2021-08-26 DIAGNOSIS — M5416 Radiculopathy, lumbar region: Secondary | ICD-10-CM | POA: Diagnosis not present

## 2021-09-02 DIAGNOSIS — M5136 Other intervertebral disc degeneration, lumbar region: Secondary | ICD-10-CM | POA: Diagnosis not present

## 2021-09-02 DIAGNOSIS — M5451 Vertebrogenic low back pain: Secondary | ICD-10-CM | POA: Diagnosis not present

## 2021-09-04 DIAGNOSIS — M5416 Radiculopathy, lumbar region: Secondary | ICD-10-CM | POA: Diagnosis not present

## 2021-09-10 DIAGNOSIS — S0990XA Unspecified injury of head, initial encounter: Secondary | ICD-10-CM | POA: Diagnosis not present

## 2021-09-10 DIAGNOSIS — M542 Cervicalgia: Secondary | ICD-10-CM | POA: Diagnosis not present

## 2021-09-10 DIAGNOSIS — M79641 Pain in right hand: Secondary | ICD-10-CM | POA: Diagnosis not present

## 2021-09-10 DIAGNOSIS — M25561 Pain in right knee: Secondary | ICD-10-CM | POA: Diagnosis not present

## 2021-09-10 DIAGNOSIS — M7989 Other specified soft tissue disorders: Secondary | ICD-10-CM | POA: Diagnosis not present

## 2021-09-10 DIAGNOSIS — S60512A Abrasion of left hand, initial encounter: Secondary | ICD-10-CM | POA: Diagnosis not present

## 2021-09-10 DIAGNOSIS — M25462 Effusion, left knee: Secondary | ICD-10-CM | POA: Diagnosis not present

## 2021-09-10 DIAGNOSIS — M79642 Pain in left hand: Secondary | ICD-10-CM | POA: Diagnosis not present

## 2021-09-10 DIAGNOSIS — I10 Essential (primary) hypertension: Secondary | ICD-10-CM | POA: Diagnosis not present

## 2021-09-10 DIAGNOSIS — R519 Headache, unspecified: Secondary | ICD-10-CM | POA: Diagnosis not present

## 2021-09-10 DIAGNOSIS — M25562 Pain in left knee: Secondary | ICD-10-CM | POA: Diagnosis not present

## 2021-09-10 DIAGNOSIS — M1711 Unilateral primary osteoarthritis, right knee: Secondary | ICD-10-CM | POA: Diagnosis not present

## 2021-09-10 DIAGNOSIS — R42 Dizziness and giddiness: Secondary | ICD-10-CM | POA: Diagnosis not present

## 2021-09-10 DIAGNOSIS — M25461 Effusion, right knee: Secondary | ICD-10-CM | POA: Diagnosis not present

## 2021-09-10 DIAGNOSIS — S50812A Abrasion of left forearm, initial encounter: Secondary | ICD-10-CM | POA: Diagnosis not present

## 2021-09-10 DIAGNOSIS — M79603 Pain in arm, unspecified: Secondary | ICD-10-CM | POA: Diagnosis not present

## 2021-09-10 DIAGNOSIS — R001 Bradycardia, unspecified: Secondary | ICD-10-CM | POA: Diagnosis not present

## 2021-09-10 DIAGNOSIS — E042 Nontoxic multinodular goiter: Secondary | ICD-10-CM | POA: Diagnosis not present

## 2021-10-08 DIAGNOSIS — I10 Essential (primary) hypertension: Secondary | ICD-10-CM | POA: Diagnosis not present

## 2021-10-08 DIAGNOSIS — E039 Hypothyroidism, unspecified: Secondary | ICD-10-CM | POA: Diagnosis not present

## 2021-10-08 DIAGNOSIS — M5416 Radiculopathy, lumbar region: Secondary | ICD-10-CM | POA: Diagnosis not present

## 2021-10-13 DIAGNOSIS — G4733 Obstructive sleep apnea (adult) (pediatric): Secondary | ICD-10-CM | POA: Diagnosis not present

## 2021-10-30 DIAGNOSIS — E041 Nontoxic single thyroid nodule: Secondary | ICD-10-CM | POA: Diagnosis not present

## 2021-11-04 DIAGNOSIS — E042 Nontoxic multinodular goiter: Secondary | ICD-10-CM | POA: Diagnosis not present

## 2021-11-04 DIAGNOSIS — E041 Nontoxic single thyroid nodule: Secondary | ICD-10-CM | POA: Diagnosis not present

## 2021-12-16 DIAGNOSIS — M5416 Radiculopathy, lumbar region: Secondary | ICD-10-CM | POA: Diagnosis not present

## 2021-12-28 DIAGNOSIS — M5416 Radiculopathy, lumbar region: Secondary | ICD-10-CM | POA: Diagnosis not present

## 2021-12-28 DIAGNOSIS — M79641 Pain in right hand: Secondary | ICD-10-CM | POA: Diagnosis not present

## 2021-12-28 DIAGNOSIS — M5451 Vertebrogenic low back pain: Secondary | ICD-10-CM | POA: Diagnosis not present

## 2022-01-11 DIAGNOSIS — M65341 Trigger finger, right ring finger: Secondary | ICD-10-CM | POA: Diagnosis not present

## 2022-01-11 DIAGNOSIS — M79641 Pain in right hand: Secondary | ICD-10-CM | POA: Diagnosis not present

## 2022-01-12 DIAGNOSIS — M5416 Radiculopathy, lumbar region: Secondary | ICD-10-CM | POA: Diagnosis not present

## 2022-01-18 DIAGNOSIS — M25561 Pain in right knee: Secondary | ICD-10-CM | POA: Diagnosis not present

## 2022-01-27 DIAGNOSIS — Z1231 Encounter for screening mammogram for malignant neoplasm of breast: Secondary | ICD-10-CM | POA: Diagnosis not present

## 2022-02-17 DIAGNOSIS — R7303 Prediabetes: Secondary | ICD-10-CM | POA: Diagnosis not present

## 2022-02-17 DIAGNOSIS — Z0000 Encounter for general adult medical examination without abnormal findings: Secondary | ICD-10-CM | POA: Diagnosis not present

## 2022-02-17 DIAGNOSIS — I10 Essential (primary) hypertension: Secondary | ICD-10-CM | POA: Diagnosis not present

## 2022-02-17 DIAGNOSIS — E7800 Pure hypercholesterolemia, unspecified: Secondary | ICD-10-CM | POA: Diagnosis not present

## 2022-02-17 DIAGNOSIS — Z23 Encounter for immunization: Secondary | ICD-10-CM | POA: Diagnosis not present

## 2022-02-17 DIAGNOSIS — E039 Hypothyroidism, unspecified: Secondary | ICD-10-CM | POA: Diagnosis not present

## 2022-02-17 DIAGNOSIS — E538 Deficiency of other specified B group vitamins: Secondary | ICD-10-CM | POA: Diagnosis not present

## 2022-02-17 DIAGNOSIS — R7309 Other abnormal glucose: Secondary | ICD-10-CM | POA: Diagnosis not present

## 2022-02-22 DIAGNOSIS — M5416 Radiculopathy, lumbar region: Secondary | ICD-10-CM | POA: Diagnosis not present

## 2022-03-19 DIAGNOSIS — H524 Presbyopia: Secondary | ICD-10-CM | POA: Diagnosis not present

## 2022-03-19 DIAGNOSIS — H5213 Myopia, bilateral: Secondary | ICD-10-CM | POA: Diagnosis not present

## 2022-03-19 DIAGNOSIS — H40012 Open angle with borderline findings, low risk, left eye: Secondary | ICD-10-CM | POA: Diagnosis not present

## 2022-03-19 DIAGNOSIS — H52223 Regular astigmatism, bilateral: Secondary | ICD-10-CM | POA: Diagnosis not present

## 2022-04-02 DIAGNOSIS — L900 Lichen sclerosus et atrophicus: Secondary | ICD-10-CM | POA: Diagnosis not present

## 2022-04-22 DIAGNOSIS — G4733 Obstructive sleep apnea (adult) (pediatric): Secondary | ICD-10-CM | POA: Diagnosis not present

## 2022-05-12 DIAGNOSIS — M5441 Lumbago with sciatica, right side: Secondary | ICD-10-CM | POA: Diagnosis not present

## 2022-05-12 DIAGNOSIS — M25551 Pain in right hip: Secondary | ICD-10-CM | POA: Diagnosis not present

## 2022-05-20 DIAGNOSIS — M5416 Radiculopathy, lumbar region: Secondary | ICD-10-CM | POA: Diagnosis not present

## 2022-05-21 DIAGNOSIS — R6889 Other general symptoms and signs: Secondary | ICD-10-CM | POA: Diagnosis not present

## 2022-05-25 DIAGNOSIS — M5416 Radiculopathy, lumbar region: Secondary | ICD-10-CM | POA: Diagnosis not present

## 2022-07-12 ENCOUNTER — Other Ambulatory Visit: Payer: Self-pay

## 2022-07-12 ENCOUNTER — Encounter (HOSPITAL_COMMUNITY): Payer: Self-pay | Admitting: Emergency Medicine

## 2022-07-12 ENCOUNTER — Emergency Department (HOSPITAL_COMMUNITY)
Admission: EM | Admit: 2022-07-12 | Discharge: 2022-07-12 | Disposition: A | Discharge status: Home / Self Care | Payer: Medicare Other | Attending: Emergency Medicine | Admitting: Emergency Medicine

## 2022-07-12 DIAGNOSIS — W01190A Fall on same level from slipping, tripping and stumbling with subsequent striking against furniture, initial encounter: Secondary | ICD-10-CM | POA: Insufficient documentation

## 2022-07-12 DIAGNOSIS — Z7982 Long term (current) use of aspirin: Secondary | ICD-10-CM | POA: Insufficient documentation

## 2022-07-12 DIAGNOSIS — S0093XA Contusion of unspecified part of head, initial encounter: Secondary | ICD-10-CM | POA: Diagnosis not present

## 2022-07-12 DIAGNOSIS — Z87891 Personal history of nicotine dependence: Secondary | ICD-10-CM | POA: Diagnosis not present

## 2022-07-12 DIAGNOSIS — Y92009 Unspecified place in unspecified non-institutional (private) residence as the place of occurrence of the external cause: Secondary | ICD-10-CM | POA: Insufficient documentation

## 2022-07-12 DIAGNOSIS — Z79899 Other long term (current) drug therapy: Secondary | ICD-10-CM | POA: Insufficient documentation

## 2022-07-12 DIAGNOSIS — I10 Essential (primary) hypertension: Secondary | ICD-10-CM | POA: Diagnosis not present

## 2022-07-12 DIAGNOSIS — S0181XA Laceration without foreign body of other part of head, initial encounter: Secondary | ICD-10-CM | POA: Insufficient documentation

## 2022-07-12 DIAGNOSIS — S0083XA Contusion of other part of head, initial encounter: Secondary | ICD-10-CM

## 2022-07-12 DIAGNOSIS — E039 Hypothyroidism, unspecified: Secondary | ICD-10-CM | POA: Diagnosis not present

## 2022-07-12 DIAGNOSIS — Z96653 Presence of artificial knee joint, bilateral: Secondary | ICD-10-CM | POA: Insufficient documentation

## 2022-07-12 NOTE — ED Notes (Signed)
Patient verbalizes understanding of discharge instructions. Opportunity for questioning and answers were provided. Pt discharged from ED to home via POV. 

## 2022-07-12 NOTE — ED Provider Notes (Signed)
[facility] Provider Note: Ashley Dell, MD, FACEP  CSN: [id number] MRN: [id number] ARRIVAL: 07/12/22 at 0339 ROOM: [facility]   CHIEF COMPLAINT  Head Injury   HISTORY OF PRESENT ILLNESS  07/12/22 3:52 AM Ashley York is a 67 y.o. female who tripped over a cord at home just prior to arrival.  She struck her right temple against piece of furniture and has a small laceration there.  She did not lose consciousness.  She is not on anticoagulation.  Her tetanus is up-to-date.  She has not had nausea or vomiting.  She rates the pain at the injury site is a 5 out of 10.   Past Medical History:  Diagnosis Date   Allergy    Cardiomegaly    CHOLELITHIASIS 05/10/2008   Qualifier: Diagnosis of  By: Nehemiah Massed RN, Cheryl     CONSTIPATION 04/23/2008   Qualifier: Diagnosis of  By: Marina Goodell MD, Wilhemina Bonito    Dyspnea on exertion    Gallstones    GERD (gastroesophageal reflux disease)    Heart murmur    HEMORRHOIDS, INTERNAL 04/19/2008   Qualifier: Diagnosis of  By: Alesia Richards     Hyperlipidemia    Hypertension    HYPERTENSION 04/19/2008   Qualifier: Diagnosis of  By: Alesia Richards     Hypothyroidism    HYPOTHYROIDISM 04/19/2008   Qualifier: Diagnosis of  By: Alesia Richards     Osteoarthritis    Overweight    Pre-diabetes    Sleep apnea    cpap    Past Surgical History:  Procedure Laterality Date   CESAREAN SECTION  1983, 1984   KNEE SURGERY Right 2010   TOTAL KNEE ARTHROPLASTY Left 12/01/2020   Procedure: TOTAL KNEE ARTHROPLASTY;  Surgeon: Ollen Gross, MD;  Location: [facility];  Service: Orthopedics;  Laterality: Left;    WISDOM TOOTH EXTRACTION  1999    Family History  Problem Relation Age of Onset   Diabetes Mother    Sarcoidosis Mother    Hypertension Mother    Diabetes Father    Multiple myeloma Father    Breast cancer Maternal Grandmother    Stroke Maternal Grandfather    Other Sister        GUN SHOT WOUND   Other Brother        GUN  SHOT WOUND   Colon cancer Neg Hx    Colon polyps Neg Hx    Esophageal cancer Neg Hx    Stomach cancer Neg Hx    Rectal cancer Neg Hx     Social History   Tobacco Use   Smoking status: Former    Types: Cigarettes    Quit date: 1979    Years since quitting: 45.3   Smokeless tobacco: Never  Vaping Use   Vaping Use: Never used  Substance Use Topics   Alcohol use: Yes    Comment: SOCIAL   Drug use: No    Prior to Admission medications   Medication Sig Start Date End Date Taking? Authorizing Provider  Alpha-Lipoic Acid 600 MG TABS Take 600 mg by mouth daily.    [provider]  amLODipine (NORVASC) 10 MG tablet Take 10 mg by mouth daily.    [provider]  aspirin EC 325 MG EC tablet Take 1 tablet (325 mg total) by mouth 2 (two) times daily. Then take one 81 mg aspirin once a day for three weeks. Then discontinue aspirin. 12/02/20   Nelia Shi D, PA-C  atorvastatin (  LIPITOR) 20 MG tablet Take 20 mg by mouth daily.    [provider]  Barberry-Oreg Grape-Goldenseal (BERBERINE COMPLEX PO) Take 1 capsule by mouth daily.    [provider]  cholecalciferol (VITAMIN D) 25 MCG (1000 UNIT) tablet Take 1,000 Units by mouth daily.    [provider]  clobetasol cream (TEMOVATE) 0.05 % Apply 1 application topically 2 (two) times daily as needed (rash).    [provider]  Coenzyme Q10 (COQ10) 100 MG CAPS Take 100 mg by mouth daily.    [provider]  Cyanocobalamin (B-12 PO) Take 1 tablet by mouth daily.    [provider]  gabapentin (NEURONTIN) 300 MG capsule Take a 300 mg capsule three times a day for two weeks following surgery.Then take a 300 mg capsule two times a day for two weeks. Then take a 300 mg capsule once a day for two weeks. Then discontinue. 12/02/20   Alyssa Grove, PA-C  levothyroxine (SYNTHROID) 150 MCG tablet Take 150 mcg by mouth daily before breakfast.    [provider]  liothyronine  (CYTOMEL) 5 MCG tablet Take 5 mcg by mouth daily.    [provider]  losartan-hydrochlorothiazide (HYZAAR) 100-25 MG tablet Take 1 tablet by mouth daily.  11/17/11   [provider]  Magnesium 250 MG TABS Take 250 mg by mouth in the morning and at bedtime.    [provider]  methocarbamol (ROBAXIN) 500 MG tablet Take 1 tablet (500 mg total) by mouth every 6 (six) hours as needed for muscle spasms. 12/02/20   Nelia Shi D, PA-C  Multiple Vitamin (MULTIVITAMIN WITH MINERALS) TABS tablet Take 1 tablet by mouth daily.    [provider]  oxyCODONE (OXY IR/ROXICODONE) 5 MG immediate release tablet Take 1-2 tablets (5-10 mg total) by mouth every 6 (six) hours as needed for severe pain. 12/02/20   Nelia Shi D, PA-C  pantoprazole (PROTONIX) 40 MG tablet Take 1 tablet (40 mg total) by mouth daily. Patient not taking: No sig reported 07/04/19   Hilarie Fredrickson, MD  traMADol (ULTRAM) 50 MG tablet Take 1-2 tablets (50-100 mg total) by mouth every 6 (six) hours as needed for moderate pain. 12/02/20   Nelia Shi D, PA-C  Turmeric 500 MG CAPS Take 500 mg by mouth daily.    [provider]  vitamin C (ASCORBIC ACID) 500 MG tablet Take 500 mg by mouth daily.    [provider]  zinc gluconate 50 MG tablet Take 50 mg by mouth daily.    [provider]    Allergies Hydrocodone and Sulfonamide derivatives   REVIEW OF SYSTEMS  Negative except as noted here or in the History of Present Illness.   PHYSICAL EXAMINATION  Initial Vital Signs Blood pressure (!) 181/88, pulse 89, temperature 98.7 F (37.1 C), temperature source Oral, resp. rate 16, weight 108 kg, SpO2 99 %.  Examination General: Well-developed, well-nourished female in no acute distress; appearance consistent with age of record HENT: normocephalic; superficial laceration right lateral forehead with underlying hematoma Eyes: pupils equal, round and reactive to light;  extraocular muscles intact Neck: supple; nontender Heart: regular rate and rhythm Lungs: clear to auscultation bilaterally Abdomen: soft; nondistended; nontender; bowel sounds present Extremities: No deformity; full range of motion Neurologic: Awake, alert and oriented; motor function intact in all extremities and symmetric; no facial droop Skin: Warm and dry Psychiatric: Normal mood and affect   RESULTS  Summary of this visit's results, reviewed and  interpreted by myself:   EKG Interpretation  Date/Time:    Ventricular Rate:    PR Interval:    QRS Duration:   QT Interval:    QTC Calculation:   R Axis:     Text Interpretation:         Laboratory Studies: No results found for this or any previous visit (from the past 24 hour(s)). Imaging Studies: No results found.  ED COURSE and MDM  Nursing notes, initial and subsequent vitals signs, including pulse oximetry, reviewed and interpreted by myself.  Vitals:   07/12/22 0344 07/12/22 0346  BP:  (!) 181/88  Pulse:  89  Resp:  16  Temp:  98.7 F (37.1 C)  TempSrc:  Oral  SpO2:  99%  Weight: 108 kg    Medications - No data to display  No evidence of serious head injury on exam or based on symptoms.  I do not believe a CT scan of the head is indicated at this time.  PROCEDURES  Procedures LACERATION REPAIR Performed by: Carlisle Beers Khaila Velarde Authorized by: Carlisle Beers Wrigley Winborne Consent: Verbal consent obtained. Risks and benefits: risks, benefits and alternatives were discussed Consent given by: patient Patient identity confirmed: provided demographic data Prepped and Draped in normal sterile fashion Wound explored  Laceration Location: right lateral forehead  Laceration Length: 1.2 cm  No Foreign Bodies seen or palpated  Anesthesia: none  Irrigation method: syringe Amount of cleaning: standard  Skin closure: Dermabond  Patient tolerance: Patient tolerated the procedure well with no immediate complications.   ED  DIAGNOSES     ICD-10-CM   1. Laceration of forehead, initial encounter  S01.81XA     2. Contusion of face, initial encounter  S00.83XA          Isa Kohlenberg, Jonny Ruiz, MD 07/12/22 [phone]

## 2022-07-12 NOTE — ED Triage Notes (Signed)
Pt presents for mechanical fall from tripping on a cord.  Denies LOC, denies blood thinners.  Tetanus vaccine in last 2 yrs.

## 2022-07-12 NOTE — ED Notes (Signed)
Pt wound cleansed by triage NT, dermabond at bedside

## 2022-07-21 DIAGNOSIS — D2239 Melanocytic nevi of other parts of face: Secondary | ICD-10-CM | POA: Diagnosis not present

## 2022-07-21 DIAGNOSIS — L815 Leukoderma, not elsewhere classified: Secondary | ICD-10-CM | POA: Diagnosis not present

## 2022-07-21 DIAGNOSIS — L821 Other seborrheic keratosis: Secondary | ICD-10-CM | POA: Diagnosis not present

## 2022-07-21 DIAGNOSIS — M25561 Pain in right knee: Secondary | ICD-10-CM | POA: Diagnosis not present

## 2022-08-26 DIAGNOSIS — M1711 Unilateral primary osteoarthritis, right knee: Secondary | ICD-10-CM | POA: Diagnosis not present

## 2022-09-15 ENCOUNTER — Ambulatory Visit (INDEPENDENT_AMBULATORY_CARE_PROVIDER_SITE_OTHER): Payer: Medicare Other | Admitting: Podiatry

## 2022-09-15 ENCOUNTER — Other Ambulatory Visit: Payer: Self-pay | Admitting: Podiatry

## 2022-09-15 DIAGNOSIS — B351 Tinea unguium: Secondary | ICD-10-CM

## 2022-09-15 DIAGNOSIS — Z79899 Other long term (current) drug therapy: Secondary | ICD-10-CM | POA: Diagnosis not present

## 2022-09-15 NOTE — Progress Notes (Signed)
Subjective:  Patient ID: Ashley York, female    DOB: 31-May-1955,  MRN: [id number]  Chief Complaint  Patient presents with   Numbness    Bilateral numbness and tingling     67 y.o. female presents with the above complaint.  Patient presents with thickened elongated dystrophic mycotic toenails x 10.  She states been present for quite some time she wanted discuss treatment options for she has not seen anyone else prior to seeing me denies any other acute complaints.  She is tried some topical medication which has not helped   Review of Systems: Negative except as noted in the HPI. Denies N/V/F/Ch.  Past Medical History:  Diagnosis Date   Allergy    Cardiomegaly    CHOLELITHIASIS 05/10/2008   Qualifier: Diagnosis of  By: Nehemiah Massed RN, Cheryl     CONSTIPATION 04/23/2008   Qualifier: Diagnosis of  By: Marina Goodell MD, Wilhemina Bonito    Dyspnea on exertion    Gallstones    GERD (gastroesophageal reflux disease)    Heart murmur    HEMORRHOIDS, INTERNAL 04/19/2008   Qualifier: Diagnosis of  By: Alesia Richards     Hyperlipidemia    Hypertension    HYPERTENSION 04/19/2008   Qualifier: Diagnosis of  By: Alesia Richards     Hypothyroidism    HYPOTHYROIDISM 04/19/2008   Qualifier: Diagnosis of  By: Alesia Richards     Osteoarthritis    Overweight    Pre-diabetes    Sleep apnea    cpap    Current Outpatient Medications:    Alpha-Lipoic Acid 600 MG TABS, Take 600 mg by mouth daily., Disp: , Rfl:    amLODipine (NORVASC) 10 MG tablet, Take 10 mg by mouth daily., Disp: , Rfl:    aspirin EC 325 MG EC tablet, Take 1 tablet (325 mg total) by mouth 2 (two) times daily. Then take one 81 mg aspirin once a day for three weeks. Then discontinue aspirin., Disp: 42 tablet, Rfl: 0   atorvastatin (LIPITOR) 20 MG tablet, Take 20 mg by mouth daily., Disp: , Rfl:    Barberry-Oreg Grape-Goldenseal (BERBERINE COMPLEX PO), Take 1 capsule by mouth daily., Disp: , Rfl:    cholecalciferol (VITAMIN D) 25 MCG  (1000 UNIT) tablet, Take 1,000 Units by mouth daily., Disp: , Rfl:    clobetasol cream (TEMOVATE) 0.05 %, Apply 1 application topically 2 (two) times daily as needed (rash)., Disp: , Rfl:    Coenzyme Q10 (COQ10) 100 MG CAPS, Take 100 mg by mouth daily., Disp: , Rfl:    Cyanocobalamin (B-12 PO), Take 1 tablet by mouth daily., Disp: , Rfl:    gabapentin (NEURONTIN) 300 MG capsule, Take a 300 mg capsule three times a day for two weeks following surgery.Then take a 300 mg capsule two times a day for two weeks. Then take a 300 mg capsule once a day for two weeks. Then discontinue., Disp: 84 capsule, Rfl: 0   levothyroxine (SYNTHROID) 150 MCG tablet, Take 150 mcg by mouth daily before breakfast., Disp: , Rfl:    liothyronine (CYTOMEL) 5 MCG tablet, Take 5 mcg by mouth daily., Disp: , Rfl:    losartan-hydrochlorothiazide (HYZAAR) 100-25 MG tablet, Take 1 tablet by mouth daily. , Disp: , Rfl:    Magnesium 250 MG TABS, Take 250 mg by mouth in the morning and at bedtime., Disp: , Rfl:    methocarbamol (ROBAXIN) 500 MG tablet, Take 1 tablet (500 mg total) by mouth every 6 (six) hours as needed  for muscle spasms., Disp: 40 tablet, Rfl: 0   Multiple Vitamin (MULTIVITAMIN WITH MINERALS) TABS tablet, Take 1 tablet by mouth daily., Disp: , Rfl:    oxyCODONE (OXY IR/ROXICODONE) 5 MG immediate release tablet, Take 1-2 tablets (5-10 mg total) by mouth every 6 (six) hours as needed for severe pain., Disp: 42 tablet, Rfl: 0   pantoprazole (PROTONIX) 40 MG tablet, Take 1 tablet (40 mg total) by mouth daily. (Patient not taking: No sig reported), Disp: 90 tablet, Rfl: 3   terbinafine (LAMISIL) 250 MG tablet, Take 1 tablet (250 mg total) by mouth daily., Disp: 90 tablet, Rfl: 0   traMADol (ULTRAM) 50 MG tablet, Take 1-2 tablets (50-100 mg total) by mouth every 6 (six) hours as needed for moderate pain., Disp: 40 tablet, Rfl: 0   Turmeric 500 MG CAPS, Take 500 mg by mouth daily., Disp: , Rfl:    vitamin C (ASCORBIC ACID) 500  MG tablet, Take 500 mg by mouth daily., Disp: , Rfl:    zinc gluconate 50 MG tablet, Take 50 mg by mouth daily., Disp: , Rfl:   Social History   Tobacco Use  Smoking Status Former   Types: Cigarettes   Quit date: 1979   Years since quitting: 45.5  Smokeless Tobacco Never    Allergies  Allergen Reactions   Hydrocodone Nausea Only   Sulfonamide Derivatives Rash   Objective:  There were no vitals filed for this visit. There is no height or weight on file to calculate BMI. Constitutional Well developed. Well nourished.  Vascular Dorsalis pedis pulses palpable bilaterally. Posterior tibial pulses palpable bilaterally. Capillary refill normal to all digits.  No cyanosis or clubbing noted. Pedal hair growth normal.  Neurologic Normal speech. Oriented to person, place, and time. Epicritic sensation to light touch grossly present bilaterally.  Dermatologic Nails thickened elongated dystrophic mycotic toenails x 10 mild pain on palpation. Skin within normal limits  Orthopedic: Normal joint ROM without pain or crepitus bilaterally. No visible deformities. No bony tenderness.   Radiographs: None Assessment:   1. Long-term use of high-risk medication   2. Onychomycosis due to dermatophyte   3. Nail fungus    Plan:  Patient was evaluated and treated and all questions answered.  Onychomycosis toenails x 10 -Educated the patient on the etiology of onychomycosis and various treatment options associated with improving the fungal load.  I explained to the patient that there is 3 treatment options available to treat the onychomycosis including topical, p.o., laser treatment.  Patient elected to undergo p.o. options with Lamisil/terbinafine therapy.  In order for me to start the medication therapy, I explained to the patient the importance of evaluating the liver and obtaining the liver function test.  Once the liver function test comes back normal I will start him on 88-month course of  Lamisil therapy.  Patient understood all risk and would like to proceed with Lamisil therapy.  I have asked the patient to immediately stop the Lamisil therapy if she has any reactions to it and call the office or go to the emergency room right away.  Patient states understanding   No follow-ups on file.

## 2022-09-16 LAB — HEPATIC FUNCTION PANEL
ALT: 13 IU/L (ref 0–32)
AST: 20 IU/L (ref 0–40)
Albumin: 4.1 g/dL (ref 3.9–4.9)
Alkaline Phosphatase: 96 IU/L (ref 44–121)
Bilirubin Total: 0.4 mg/dL (ref 0.0–1.2)
Bilirubin, Direct: 0.11 mg/dL (ref 0.00–0.40)
Total Protein: 7.1 g/dL (ref 6.0–8.5)

## 2022-09-16 MED ORDER — TERBINAFINE HCL 250 MG PO TABS: 250.0000 mg | ORAL_TABLET | Freq: Every day | ORAL | 0 refills | Status: DC

## 2022-09-16 NOTE — Addendum Note (Signed)
Addended by: Nicholes Rough on: 09/16/2022 12:19 PM   Modules accepted: Orders

## 2022-09-29 DIAGNOSIS — M25561 Pain in right knee: Secondary | ICD-10-CM | POA: Diagnosis not present

## 2022-09-29 DIAGNOSIS — M25661 Stiffness of right knee, not elsewhere classified: Secondary | ICD-10-CM | POA: Diagnosis not present

## 2022-09-29 DIAGNOSIS — M1711 Unilateral primary osteoarthritis, right knee: Secondary | ICD-10-CM | POA: Diagnosis not present

## 2022-10-04 DIAGNOSIS — G4733 Obstructive sleep apnea (adult) (pediatric): Secondary | ICD-10-CM | POA: Diagnosis not present

## 2022-11-01 DIAGNOSIS — Z0189 Encounter for other specified special examinations: Secondary | ICD-10-CM | POA: Diagnosis not present

## 2022-11-03 DIAGNOSIS — M1711 Unilateral primary osteoarthritis, right knee: Secondary | ICD-10-CM | POA: Diagnosis not present

## 2022-11-03 DIAGNOSIS — Z96651 Presence of right artificial knee joint: Secondary | ICD-10-CM | POA: Diagnosis not present

## 2022-11-03 DIAGNOSIS — G8918 Other acute postprocedural pain: Secondary | ICD-10-CM | POA: Diagnosis not present

## 2022-11-09 DIAGNOSIS — M1711 Unilateral primary osteoarthritis, right knee: Secondary | ICD-10-CM | POA: Diagnosis not present

## 2022-11-09 DIAGNOSIS — Z471 Aftercare following joint replacement surgery: Secondary | ICD-10-CM | POA: Diagnosis not present

## 2022-11-11 DIAGNOSIS — Z471 Aftercare following joint replacement surgery: Secondary | ICD-10-CM | POA: Diagnosis not present

## 2022-11-11 DIAGNOSIS — M1711 Unilateral primary osteoarthritis, right knee: Secondary | ICD-10-CM | POA: Diagnosis not present

## 2022-11-15 DIAGNOSIS — Z471 Aftercare following joint replacement surgery: Secondary | ICD-10-CM | POA: Diagnosis not present

## 2022-11-15 DIAGNOSIS — M1711 Unilateral primary osteoarthritis, right knee: Secondary | ICD-10-CM | POA: Diagnosis not present

## 2022-11-19 DIAGNOSIS — Z471 Aftercare following joint replacement surgery: Secondary | ICD-10-CM | POA: Diagnosis not present

## 2022-11-19 DIAGNOSIS — M1711 Unilateral primary osteoarthritis, right knee: Secondary | ICD-10-CM | POA: Diagnosis not present

## 2022-11-24 DIAGNOSIS — Z471 Aftercare following joint replacement surgery: Secondary | ICD-10-CM | POA: Diagnosis not present

## 2022-11-24 DIAGNOSIS — M1711 Unilateral primary osteoarthritis, right knee: Secondary | ICD-10-CM | POA: Diagnosis not present

## 2022-11-30 DIAGNOSIS — Z471 Aftercare following joint replacement surgery: Secondary | ICD-10-CM | POA: Diagnosis not present

## 2022-11-30 DIAGNOSIS — M1711 Unilateral primary osteoarthritis, right knee: Secondary | ICD-10-CM | POA: Diagnosis not present

## 2022-12-03 DIAGNOSIS — Z471 Aftercare following joint replacement surgery: Secondary | ICD-10-CM | POA: Diagnosis not present

## 2022-12-03 DIAGNOSIS — M1711 Unilateral primary osteoarthritis, right knee: Secondary | ICD-10-CM | POA: Diagnosis not present

## 2022-12-07 DIAGNOSIS — Z471 Aftercare following joint replacement surgery: Secondary | ICD-10-CM | POA: Diagnosis not present

## 2022-12-07 DIAGNOSIS — Z96651 Presence of right artificial knee joint: Secondary | ICD-10-CM | POA: Diagnosis not present

## 2022-12-08 DIAGNOSIS — Z471 Aftercare following joint replacement surgery: Secondary | ICD-10-CM | POA: Diagnosis not present

## 2022-12-08 DIAGNOSIS — M1711 Unilateral primary osteoarthritis, right knee: Secondary | ICD-10-CM | POA: Diagnosis not present

## 2022-12-10 DIAGNOSIS — M1711 Unilateral primary osteoarthritis, right knee: Secondary | ICD-10-CM | POA: Diagnosis not present

## 2022-12-10 DIAGNOSIS — Z471 Aftercare following joint replacement surgery: Secondary | ICD-10-CM | POA: Diagnosis not present

## 2022-12-14 DIAGNOSIS — M5416 Radiculopathy, lumbar region: Secondary | ICD-10-CM | POA: Diagnosis not present

## 2022-12-15 DIAGNOSIS — M1711 Unilateral primary osteoarthritis, right knee: Secondary | ICD-10-CM | POA: Diagnosis not present

## 2022-12-15 DIAGNOSIS — Z471 Aftercare following joint replacement surgery: Secondary | ICD-10-CM | POA: Diagnosis not present

## 2022-12-20 DIAGNOSIS — M1711 Unilateral primary osteoarthritis, right knee: Secondary | ICD-10-CM | POA: Diagnosis not present

## 2022-12-20 DIAGNOSIS — Z471 Aftercare following joint replacement surgery: Secondary | ICD-10-CM | POA: Diagnosis not present

## 2022-12-27 DIAGNOSIS — M1711 Unilateral primary osteoarthritis, right knee: Secondary | ICD-10-CM | POA: Diagnosis not present

## 2022-12-27 DIAGNOSIS — Z471 Aftercare following joint replacement surgery: Secondary | ICD-10-CM | POA: Diagnosis not present

## 2023-01-06 DIAGNOSIS — Z471 Aftercare following joint replacement surgery: Secondary | ICD-10-CM | POA: Diagnosis not present

## 2023-01-06 DIAGNOSIS — M1711 Unilateral primary osteoarthritis, right knee: Secondary | ICD-10-CM | POA: Diagnosis not present

## 2023-01-12 ENCOUNTER — Encounter: Payer: Self-pay | Admitting: Podiatry

## 2023-01-12 ENCOUNTER — Ambulatory Visit (INDEPENDENT_AMBULATORY_CARE_PROVIDER_SITE_OTHER): Payer: Medicare Other | Admitting: Podiatry

## 2023-01-12 DIAGNOSIS — B351 Tinea unguium: Secondary | ICD-10-CM

## 2023-01-12 DIAGNOSIS — Z79899 Other long term (current) drug therapy: Secondary | ICD-10-CM | POA: Diagnosis not present

## 2023-01-12 NOTE — Progress Notes (Signed)
Subjective:  Patient ID: Ashley York, female    DOB: 11/03/1955,  MRN: [id number]  Chief Complaint  Patient presents with   Routine Post Op    PATIENT STATES THAT HER FEET HAVE BEEN DOING BETTER     67 y.o. female presents with the above complaint.  Patient presents with follow-up of thickened and onychodystrophy mycotic toenails x 10.  Patient states that it has been doing much better.  She would like to continue medication no problems taking it   Review of Systems: Negative except as noted in the HPI. Denies N/V/F/Ch.  Past Medical History:  Diagnosis Date   Allergy    Cardiomegaly    CHOLELITHIASIS 05/10/2008   Qualifier: Diagnosis of  By: Nehemiah Massed RN, Cheryl     CONSTIPATION 04/23/2008   Qualifier: Diagnosis of  By: Marina Goodell MD, Wilhemina Bonito    Dyspnea on exertion    Gallstones    GERD (gastroesophageal reflux disease)    Heart murmur    HEMORRHOIDS, INTERNAL 04/19/2008   Qualifier: Diagnosis of  By: Alesia Richards     Hyperlipidemia    Hypertension    HYPERTENSION 04/19/2008   Qualifier: Diagnosis of  By: Alesia Richards     Hypothyroidism    HYPOTHYROIDISM 04/19/2008   Qualifier: Diagnosis of  By: Alesia Richards     Osteoarthritis    Overweight    Pre-diabetes    Sleep apnea    cpap    Current Outpatient Medications:    Alpha-Lipoic Acid 600 MG TABS, Take 600 mg by mouth daily., Disp: , Rfl:    amLODipine (NORVASC) 10 MG tablet, Take 10 mg by mouth daily., Disp: , Rfl:    aspirin EC 325 MG EC tablet, Take 1 tablet (325 mg total) by mouth 2 (two) times daily. Then take one 81 mg aspirin once a day for three weeks. Then discontinue aspirin., Disp: 42 tablet, Rfl: 0   atorvastatin (LIPITOR) 20 MG tablet, Take 20 mg by mouth daily., Disp: , Rfl:    Barberry-Oreg Grape-Goldenseal (BERBERINE COMPLEX PO), Take 1 capsule by mouth daily., Disp: , Rfl:    cholecalciferol (VITAMIN D) 25 MCG (1000 UNIT) tablet, Take 1,000 Units by mouth daily., Disp: , Rfl:     clobetasol cream (TEMOVATE) 0.05 %, Apply 1 application topically 2 (two) times daily as needed (rash)., Disp: , Rfl:    Coenzyme Q10 (COQ10) 100 MG CAPS, Take 100 mg by mouth daily., Disp: , Rfl:    Cyanocobalamin (B-12 PO), Take 1 tablet by mouth daily., Disp: , Rfl:    gabapentin (NEURONTIN) 300 MG capsule, Take a 300 mg capsule three times a day for two weeks following surgery.Then take a 300 mg capsule two times a day for two weeks. Then take a 300 mg capsule once a day for two weeks. Then discontinue., Disp: 84 capsule, Rfl: 0   levothyroxine (SYNTHROID) 150 MCG tablet, Take 150 mcg by mouth daily before breakfast., Disp: , Rfl:    liothyronine (CYTOMEL) 5 MCG tablet, Take 5 mcg by mouth daily., Disp: , Rfl:    losartan-hydrochlorothiazide (HYZAAR) 100-25 MG tablet, Take 1 tablet by mouth daily. , Disp: , Rfl:    Magnesium 250 MG TABS, Take 250 mg by mouth in the morning and at bedtime., Disp: , Rfl:    methocarbamol (ROBAXIN) 500 MG tablet, Take 1 tablet (500 mg total) by mouth every 6 (six) hours as needed for muscle spasms., Disp: 40 tablet, Rfl: 0   Multiple  Vitamin (MULTIVITAMIN WITH MINERALS) TABS tablet, Take 1 tablet by mouth daily., Disp: , Rfl:    oxyCODONE (OXY IR/ROXICODONE) 5 MG immediate release tablet, Take 1-2 tablets (5-10 mg total) by mouth every 6 (six) hours as needed for severe pain., Disp: 42 tablet, Rfl: 0   pantoprazole (PROTONIX) 40 MG tablet, Take 1 tablet (40 mg total) by mouth daily., Disp: 90 tablet, Rfl: 3   terbinafine (LAMISIL) 250 MG tablet, Take 1 tablet (250 mg total) by mouth daily., Disp: 90 tablet, Rfl: 0   traMADol (ULTRAM) 50 MG tablet, Take 1-2 tablets (50-100 mg total) by mouth every 6 (six) hours as needed for moderate pain., Disp: 40 tablet, Rfl: 0   Turmeric 500 MG CAPS, Take 500 mg by mouth daily., Disp: , Rfl:    vitamin C (ASCORBIC ACID) 500 MG tablet, Take 500 mg by mouth daily., Disp: , Rfl:    zinc gluconate 50 MG tablet, Take 50 mg by mouth  daily., Disp: , Rfl:   Social History   Tobacco Use  Smoking Status Former   Current packs/day: 0.00   Types: Cigarettes   Quit date: 1979   Years since quitting: 45.8  Smokeless Tobacco Never    Allergies  Allergen Reactions   Hydrocodone Nausea Only   Sulfonamide Derivatives Rash   Objective:  There were no vitals filed for this visit. There is no height or weight on file to calculate BMI. Constitutional Well developed. Well nourished.  Vascular Dorsalis pedis pulses palpable bilaterally. Posterior tibial pulses palpable bilaterally. Capillary refill normal to all digits.  No cyanosis or clubbing noted. Pedal hair growth normal.  Neurologic Normal speech. Oriented to person, place, and time. Epicritic sensation to light touch grossly present bilaterally.  Dermatologic Nails thickened elongated dystrophic mycotic toenails x 10 mild pain on palpation.  Improving Skin within normal limits  Orthopedic: Normal joint ROM without pain or crepitus bilaterally. No visible deformities. No bony tenderness.   Radiographs: None Assessment:   No diagnosis found.  Plan:  Patient was evaluated and treated and all questions answered.  Onychomycosis toenails x 10~second round -Educated the patient on the etiology of onychomycosis and various treatment options associated with improving the fungal load.  I explained to the patient that there is 3 treatment options available to treat the onychomycosis including topical, p.o., laser treatment.  Patient elected to undergo p.o. options with Lamisil/terbinafine therapy.  In order for me to start the medication therapy, I explained to the patient the importance of evaluating the liver and obtaining the liver function test.  Once the liver function test comes back normal I will start him on 27-month course of Lamisil therapy.  Patient understood all risk and would like to proceed with Lamisil therapy.  I have asked the patient to immediately stop  the Lamisil therapy if she has any reactions to it and call the office or go to the emergency room right away.  Patient states understanding   No follow-ups on file.

## 2023-01-12 NOTE — Addendum Note (Signed)
Addended by: Nicholes Rough on: 01/12/2023 01:53 PM   Modules accepted: Orders

## 2023-01-13 LAB — HEPATIC FUNCTION PANEL
ALT: 9 [IU]/L (ref 0–32)
AST: 17 [IU]/L (ref 0–40)
Albumin: 4.1 g/dL (ref 3.9–4.9)
Alkaline Phosphatase: 94 [IU]/L (ref 44–121)
Bilirubin Total: 0.3 mg/dL (ref 0.0–1.2)
Bilirubin, Direct: <0.1 mg/dL (ref 0.00–0.40)
Total Protein: 7.2 g/dL (ref 6.0–8.5)

## 2023-01-13 MED ORDER — TERBINAFINE HCL 250 MG PO TABS: 250.0000 mg | ORAL_TABLET | Freq: Every day | ORAL | 0 refills | Status: DC

## 2023-01-13 NOTE — Addendum Note (Signed)
Addended by: Nicholes Rough on: 01/13/2023 08:25 AM   Modules accepted: Orders

## 2023-01-17 ENCOUNTER — Encounter: Payer: Self-pay | Admitting: Podiatry

## 2023-02-02 DIAGNOSIS — Z1231 Encounter for screening mammogram for malignant neoplasm of breast: Secondary | ICD-10-CM | POA: Diagnosis not present

## 2023-02-23 ENCOUNTER — Ambulatory Visit: Payer: Medicare Other | Admitting: Podiatry

## 2023-02-23 ENCOUNTER — Other Ambulatory Visit: Payer: Self-pay | Admitting: Internal Medicine

## 2023-02-23 DIAGNOSIS — E041 Nontoxic single thyroid nodule: Secondary | ICD-10-CM | POA: Diagnosis not present

## 2023-02-23 DIAGNOSIS — R7303 Prediabetes: Secondary | ICD-10-CM | POA: Diagnosis not present

## 2023-02-23 DIAGNOSIS — Z23 Encounter for immunization: Secondary | ICD-10-CM | POA: Diagnosis not present

## 2023-02-23 DIAGNOSIS — R7309 Other abnormal glucose: Secondary | ICD-10-CM | POA: Diagnosis not present

## 2023-02-23 DIAGNOSIS — E538 Deficiency of other specified B group vitamins: Secondary | ICD-10-CM | POA: Diagnosis not present

## 2023-02-23 DIAGNOSIS — E7800 Pure hypercholesterolemia, unspecified: Secondary | ICD-10-CM | POA: Diagnosis not present

## 2023-02-23 DIAGNOSIS — E039 Hypothyroidism, unspecified: Secondary | ICD-10-CM | POA: Diagnosis not present

## 2023-02-23 DIAGNOSIS — I10 Essential (primary) hypertension: Secondary | ICD-10-CM | POA: Diagnosis not present

## 2023-02-23 DIAGNOSIS — G4730 Sleep apnea, unspecified: Secondary | ICD-10-CM | POA: Diagnosis not present

## 2023-02-23 DIAGNOSIS — Z0000 Encounter for general adult medical examination without abnormal findings: Secondary | ICD-10-CM | POA: Diagnosis not present

## 2023-02-23 DIAGNOSIS — E559 Vitamin D deficiency, unspecified: Secondary | ICD-10-CM | POA: Diagnosis not present

## 2023-02-23 DIAGNOSIS — Z5181 Encounter for therapeutic drug level monitoring: Secondary | ICD-10-CM | POA: Diagnosis not present

## 2023-02-24 ENCOUNTER — Ambulatory Visit
Admission: RE | Admit: 2023-02-24 | Discharge: 2023-02-24 | Disposition: A | Discharge status: Home / Self Care | Payer: Medicare Other | Source: Ambulatory Visit | Attending: Internal Medicine | Admitting: Internal Medicine

## 2023-02-24 DIAGNOSIS — E041 Nontoxic single thyroid nodule: Secondary | ICD-10-CM

## 2023-02-24 DIAGNOSIS — E042 Nontoxic multinodular goiter: Secondary | ICD-10-CM | POA: Diagnosis not present

## 2023-03-25 DIAGNOSIS — Z96651 Presence of right artificial knee joint: Secondary | ICD-10-CM | POA: Diagnosis not present

## 2023-03-25 DIAGNOSIS — Z96653 Presence of artificial knee joint, bilateral: Secondary | ICD-10-CM | POA: Diagnosis not present

## 2023-03-25 DIAGNOSIS — Z471 Aftercare following joint replacement surgery: Secondary | ICD-10-CM | POA: Diagnosis not present

## 2023-05-18 ENCOUNTER — Ambulatory Visit (INDEPENDENT_AMBULATORY_CARE_PROVIDER_SITE_OTHER): Payer: 59 | Admitting: Podiatry

## 2023-05-18 DIAGNOSIS — Z79899 Other long term (current) drug therapy: Secondary | ICD-10-CM

## 2023-05-18 DIAGNOSIS — B351 Tinea unguium: Secondary | ICD-10-CM | POA: Diagnosis not present

## 2023-05-18 NOTE — Progress Notes (Signed)
 Subjective:  Patient ID: Ashley York, female    DOB: 12/04/1955,  MRN: [id number]  Chief Complaint  Patient presents with   Nail Problem    Nail fungus follow up     68 y.o. female presents with the above complaint.  Patient presents with follow-up of thickened and onychodystrophy mycotic toenails x 10.  Patient states that it has been doing much better.  She would like to continue medication no problems taking it   Review of Systems: Negative except as noted in the HPI. Denies N/V/F/Ch.  Past Medical History:  Diagnosis Date   Allergy    Cardiomegaly    CHOLELITHIASIS 05/10/2008   Qualifier: Diagnosis of  By: Nehemiah Massed RN, Cheryl     CONSTIPATION 04/23/2008   Qualifier: Diagnosis of  By: Marina Goodell MD, Wilhemina Bonito    Dyspnea on exertion    Gallstones    GERD (gastroesophageal reflux disease)    Heart murmur    HEMORRHOIDS, INTERNAL 04/19/2008   Qualifier: Diagnosis of  By: Alesia Richards     Hyperlipidemia    Hypertension    HYPERTENSION 04/19/2008   Qualifier: Diagnosis of  By: Alesia Richards     Hypothyroidism    HYPOTHYROIDISM 04/19/2008   Qualifier: Diagnosis of  By: Alesia Richards     Osteoarthritis    Overweight    Pre-diabetes    Sleep apnea    cpap    Current Outpatient Medications:    Alpha-Lipoic Acid 600 MG TABS, Take 600 mg by mouth daily., Disp: , Rfl:    amLODipine (NORVASC) 10 MG tablet, Take 10 mg by mouth daily., Disp: , Rfl:    aspirin EC 325 MG EC tablet, Take 1 tablet (325 mg total) by mouth 2 (two) times daily. Then take one 81 mg aspirin once a day for three weeks. Then discontinue aspirin., Disp: 42 tablet, Rfl: 0   atorvastatin (LIPITOR) 20 MG tablet, Take 20 mg by mouth daily., Disp: , Rfl:    Barberry-Oreg Grape-Goldenseal (BERBERINE COMPLEX PO), Take 1 capsule by mouth daily., Disp: , Rfl:    cholecalciferol (VITAMIN D) 25 MCG (1000 UNIT) tablet, Take 1,000 Units by mouth daily., Disp: , Rfl:    clobetasol cream (TEMOVATE) 0.05 %,  Apply 1 application topically 2 (two) times daily as needed (rash)., Disp: , Rfl:    Coenzyme Q10 (COQ10) 100 MG CAPS, Take 100 mg by mouth daily., Disp: , Rfl:    Cyanocobalamin (B-12 PO), Take 1 tablet by mouth daily., Disp: , Rfl:    gabapentin (NEURONTIN) 300 MG capsule, Take a 300 mg capsule three times a day for two weeks following surgery.Then take a 300 mg capsule two times a day for two weeks. Then take a 300 mg capsule once a day for two weeks. Then discontinue., Disp: 84 capsule, Rfl: 0   levothyroxine (SYNTHROID) 150 MCG tablet, Take 150 mcg by mouth daily before breakfast., Disp: , Rfl:    liothyronine (CYTOMEL) 5 MCG tablet, Take 5 mcg by mouth daily., Disp: , Rfl:    losartan-hydrochlorothiazide (HYZAAR) 100-25 MG tablet, Take 1 tablet by mouth daily. , Disp: , Rfl:    Magnesium 250 MG TABS, Take 250 mg by mouth in the morning and at bedtime., Disp: , Rfl:    methocarbamol (ROBAXIN) 500 MG tablet, Take 1 tablet (500 mg total) by mouth every 6 (six) hours as needed for muscle spasms., Disp: 40 tablet, Rfl: 0   Multiple Vitamin (MULTIVITAMIN WITH MINERALS) TABS tablet,  Take 1 tablet by mouth daily., Disp: , Rfl:    oxyCODONE (OXY IR/ROXICODONE) 5 MG immediate release tablet, Take 1-2 tablets (5-10 mg total) by mouth every 6 (six) hours as needed for severe pain., Disp: 42 tablet, Rfl: 0   pantoprazole (PROTONIX) 40 MG tablet, Take 1 tablet (40 mg total) by mouth daily., Disp: 90 tablet, Rfl: 3   terbinafine (LAMISIL) 250 MG tablet, Take 1 tablet (250 mg total) by mouth daily., Disp: 90 tablet, Rfl: 0   terbinafine (LAMISIL) 250 MG tablet, Take 1 tablet (250 mg total) by mouth daily., Disp: 90 tablet, Rfl: 0   traMADol (ULTRAM) 50 MG tablet, Take 1-2 tablets (50-100 mg total) by mouth every 6 (six) hours as needed for moderate pain., Disp: 40 tablet, Rfl: 0   Turmeric 500 MG CAPS, Take 500 mg by mouth daily., Disp: , Rfl:    vitamin C (ASCORBIC ACID) 500 MG tablet, Take 500 mg by mouth  daily., Disp: , Rfl:    zinc gluconate 50 MG tablet, Take 50 mg by mouth daily., Disp: , Rfl:   Social History   Tobacco Use  Smoking Status Former   Current packs/day: 0.00   Types: Cigarettes   Quit date: 1979   Years since quitting: 46.1  Smokeless Tobacco Never    Allergies  Allergen Reactions   Hydrocodone Nausea Only   Sulfonamide Derivatives Rash   Objective:  There were no vitals filed for this visit. There is no height or weight on file to calculate BMI. Constitutional Well developed. Well nourished.  Vascular Dorsalis pedis pulses palpable bilaterally. Posterior tibial pulses palpable bilaterally. Capillary refill normal to all digits.  No cyanosis or clubbing noted. Pedal hair growth normal.  Neurologic Normal speech. Oriented to person, place, and time. Epicritic sensation to light touch grossly present bilaterally.  Dermatologic Nails thickened elongated dystrophic mycotic toenails x 10 mild pain on palpation.  Improving slowly Skin within normal limits  Orthopedic: Normal joint ROM without pain or crepitus bilaterally. No visible deformities. No bony tenderness.   Radiographs: None Assessment:   No diagnosis found.  Plan:  Patient was evaluated and treated and all questions answered.  Onychomycosis toenails x 10~second round -Clinically doing better.  I encouraged patient to continue clinically monitoring at this time patient does not want to topical medication or laser therapy.  We will continue monitoring if it regresses we will discuss other treatment options.  No follow-ups on file.

## 2023-06-06 ENCOUNTER — Encounter: Payer: Self-pay | Admitting: Internal Medicine

## 2023-06-09 DIAGNOSIS — H52223 Regular astigmatism, bilateral: Secondary | ICD-10-CM | POA: Diagnosis not present

## 2023-06-09 DIAGNOSIS — H2513 Age-related nuclear cataract, bilateral: Secondary | ICD-10-CM | POA: Diagnosis not present

## 2023-06-09 DIAGNOSIS — H3562 Retinal hemorrhage, left eye: Secondary | ICD-10-CM | POA: Diagnosis not present

## 2023-06-09 DIAGNOSIS — H5213 Myopia, bilateral: Secondary | ICD-10-CM | POA: Diagnosis not present

## 2023-06-14 ENCOUNTER — Ambulatory Visit (AMBULATORY_SURGERY_CENTER): Admitting: *Deleted

## 2023-06-14 VITALS — Ht 66.0 in | Wt 250.0 lb

## 2023-06-14 DIAGNOSIS — Z1211 Encounter for screening for malignant neoplasm of colon: Secondary | ICD-10-CM

## 2023-06-14 MED ORDER — SUFLAVE 178.7 G PO SOLR: 1.0000 | Freq: Once | ORAL | 0 refills | Status: AC

## 2023-06-14 NOTE — Progress Notes (Signed)
 Pt's name and DOB verified at the beginning of the pre-visit wit 2 identifiers  Pt denies any difficulty with ambulating,sitting, laying down or rolling side to side  Pt has no issues with ambulation   Pt has no issues moving head neck or swallowing  No egg or soy allergy known to patient   No issues known to pt with past sedation with any surgeries or procedures  Pt denies having issues being intubated  No FH of Malignant Hyperthermia  Pt is not on diet pills or shots  Pt is not on home 02   Pt is not on blood thinners   Pt denies issues with constipation   Pt is not on dialysis  Pt denise any abnormal heart rhythms   Pt denies any upcoming cardiac testing   Chart not reviewed by CRNA prior to PV  Visit by phone  Pt states weight is 250 lb  IInstructions reviewed. Pt given [facility] main # and MD on call # prior to instructions.  Pt states understanding of instructions. Instructed to review again prior to procedure. Pt states they will.   Informed pt that they will receive a text or  call from [facility] regarding there prep med.

## 2023-06-23 DIAGNOSIS — Z96651 Presence of right artificial knee joint: Secondary | ICD-10-CM | POA: Diagnosis not present

## 2023-06-24 ENCOUNTER — Encounter: Payer: Self-pay | Admitting: Internal Medicine

## 2023-06-28 ENCOUNTER — Ambulatory Visit (AMBULATORY_SURGERY_CENTER): Admitting: Internal Medicine

## 2023-06-28 ENCOUNTER — Encounter: Payer: Self-pay | Admitting: Internal Medicine

## 2023-06-28 VITALS — BP 142/65 | HR 71 | Temp 98.4°F | Resp 21 | Ht 66.0 in | Wt 250.0 lb

## 2023-06-28 DIAGNOSIS — K5730 Diverticulosis of large intestine without perforation or abscess without bleeding: Secondary | ICD-10-CM | POA: Diagnosis not present

## 2023-06-28 DIAGNOSIS — Z860101 Personal history of adenomatous and serrated colon polyps: Secondary | ICD-10-CM

## 2023-06-28 DIAGNOSIS — Z1211 Encounter for screening for malignant neoplasm of colon: Secondary | ICD-10-CM

## 2023-06-28 DIAGNOSIS — Z860100 Personal history of colon polyps, unspecified: Secondary | ICD-10-CM

## 2023-06-28 DIAGNOSIS — G4730 Sleep apnea, unspecified: Secondary | ICD-10-CM | POA: Diagnosis not present

## 2023-06-28 DIAGNOSIS — K648 Other hemorrhoids: Secondary | ICD-10-CM | POA: Diagnosis not present

## 2023-06-28 MED ORDER — SODIUM CHLORIDE 0.9 % IV SOLN: 500.0000 mL | Freq: Once | INTRAVENOUS | Status: DC

## 2023-06-28 NOTE — Op Note (Signed)
 [facility] Patient Name: Ashley York Procedure Date: 06/28/2023 4:00 PM MRN: [id number] Endoscopist: Wilhemina Bonito. Marina Goodell , MD, [id number] Age: 68 Referring MD:  Date of Birth: Aug 07, 1955 Gender: Female Account #: [id number] Procedure:                Colonoscopy Indications:              High risk colon cancer surveillance: Personal                            history of non-advanced adenoma. prior exams 2004,                            09, 15 Medicines:                Monitored Anesthesia Care Procedure:                Pre-Anesthesia Assessment:                           - Prior to the procedure, a History and Physical                            was performed, and patient medications and                            allergies were reviewed. The patient's tolerance of                            previous anesthesia was also reviewed. The risks                            and benefits of the procedure and the sedation                            options and risks were discussed with the patient.                            All questions were answered, and informed consent                            was obtained. Prior Anticoagulants: The patient has                            taken no anticoagulant or antiplatelet agents. ASA                            Grade Assessment: II - A patient with mild systemic                            disease. After reviewing the risks and benefits,                            the patient was deemed in satisfactory condition to  undergo the procedure.                           After obtaining informed consent, the colonoscope                            was passed under direct vision. Throughout the                            procedure, the patient's blood pressure, pulse, and                            oxygen saturations were monitored continuously. The                            CF [id number] #[id number] was introduced through the anus                             and advanced to the the cecum, identified by                            appendiceal orifice and ileocecal valve. The                            ileocecal valve, appendiceal orifice, and rectum                            were photographed. The quality of the bowel                            preparation was excellent. The colonoscopy was                            performed without difficulty. The patient tolerated                            the procedure well. The bowel preparation used was                            SUPREP. Scope In: 4:13:41 PM Scope Out: 4:25:13 PM Scope Withdrawal Time: 0 hours 7 minutes 29 seconds  Total Procedure Duration: 0 hours 11 minutes 32 seconds  Findings:                 A single diverticulum was found in the sigmoid                            colon.                           Internal hemorrhoids were found during retroflexion.                           The exam was otherwise without abnormality on  direct and retroflexion views. Complications:            No immediate complications. Estimated blood loss:                            None. Estimated Blood Loss:     Estimated blood loss: none. Impression:               - Diverticulosis in the sigmoid colon.                           - Internal hemorrhoids.                           - The examination was otherwise normal on direct                            and retroflexion views.                           - No specimens collected. Recommendation:           - Repeat colonoscopy in 10 years for surveillance.                           - Patient has a contact number available for                            emergencies. The signs and symptoms of potential                            delayed complications were discussed with the                            patient. Return to normal activities tomorrow.                            Written discharge instructions were  provided to the                            patient.                           - Resume previous diet.                           - Continue present medications. Wilhemina Bonito. Marina Goodell, MD 06/28/2023 4:40:51 PM This report has been signed electronically.

## 2023-06-28 NOTE — Progress Notes (Signed)
 Report to PACU, RN, vss, BBS= Clear.

## 2023-06-28 NOTE — Progress Notes (Signed)
 HISTORY OF PRESENT ILLNESS:  Ashley York is a 68 y.o. female with a history of nonadvanced adenoma.  Previous exams 2004, 2009, 2015.  Now for surveillance  REVIEW OF SYSTEMS:  All non-GI ROS negative except for  Past Medical History:  Diagnosis Date   Allergy    CHOLELITHIASIS 05/10/2008   Qualifier: Diagnosis of  By: Nehemiah Massed RN, Elnita Maxwell     CONSTIPATION 04/23/2008   Qualifier: Diagnosis of  By: Marina Goodell MD, Wilhemina Bonito    Dyspnea on exertion    Gallstones    GERD (gastroesophageal reflux disease)    Heart murmur    HEMORRHOIDS, INTERNAL 04/19/2008   Qualifier: Diagnosis of  By: Alesia Richards     Hyperlipidemia    Hypertension    HYPERTENSION 04/19/2008   Qualifier: Diagnosis of  By: Alesia Richards     Hypothyroidism    HYPOTHYROIDISM 04/19/2008   Qualifier: Diagnosis of  By: Alesia Richards     Osteoarthritis    Overweight    Pre-diabetes    Sleep apnea    cpap    Past Surgical History:  Procedure Laterality Date   CESAREAN SECTION  1983, 1984   COLONOSCOPY     KNEE SURGERY Right 03/22/2008   TOTAL KNEE ARTHROPLASTY Left 12/01/2020   Procedure: TOTAL KNEE ARTHROPLASTY;  Surgeon: Ollen Gross, MD;  Location: [facility];  Service: Orthopedics;  Laterality: Left;    WISDOM TOOTH EXTRACTION  03/22/1997    Social History Ashley York  reports that she quit smoking about 46 years ago. Her smoking use included cigarettes. She has never used smokeless tobacco. She reports current alcohol use. She reports that she does not use drugs.  family history includes Breast cancer in her maternal grandmother; Diabetes in her father and mother; Hypertension in her mother; Multiple myeloma in her father; Other in her brother and sister; Sarcoidosis in her mother; Stroke in her maternal grandfather.  Allergies  Allergen Reactions   Hydrocodone Nausea Only   Lisinopril Cough   Oxycodone Nausea Only   Sulfonamide Derivatives Rash       PHYSICAL  EXAMINATION: Vital signs: BP (!) 141/70   Pulse 70   Temp 98.4 F (36.9 C) (Temporal)   Ht 5\' 6"  (1.676 m)   Wt 250 lb (113.4 kg)   SpO2 97%   BMI 40.35 kg/m  General: Well-developed, well-nourished, no acute distress HEENT: Sclerae are anicteric, conjunctiva pink. Oral mucosa intact Lungs: Clear Heart: Regular Abdomen: soft, nontender, nondistended, no obvious ascites, no peritoneal signs, normal bowel sounds. No organomegaly. Extremities: No edema Psychiatric: alert and oriented x3. Cooperative     ASSESSMENT:  History of nonadvanced adenoma   PLAN:  Surveillance colonoscopy

## 2023-06-28 NOTE — Patient Instructions (Addendum)
Thank you for letting us take care of your healthcare needs today. Please see handouts given to you on Hemorrhoids.    YOU HAD AN ENDOSCOPIC PROCEDURE TODAY AT THE [facility]:   Refer to the procedure report that was given to you for any specific questions about what was found during the examination.  If the procedure report does not answer your questions, please call your gastroenterologist to clarify.  If you requested that your care partner not be given the details of your procedure findings, then the procedure report has been included in a sealed envelope for you to review at your convenience later.  YOU SHOULD EXPECT: Some feelings of bloating in the abdomen. Passage of more gas than usual.  Walking can help get rid of the air that was put into your GI tract during the procedure and reduce the bloating. If you had a lower endoscopy (such as a colonoscopy or flexible sigmoidoscopy) you may notice spotting of blood in your stool or on the toilet paper. If you underwent a bowel prep for your procedure, you may not have a normal bowel movement for a few days.  Please Note:  You might notice some irritation and congestion in your nose or some drainage.  This is from the oxygen used during your procedure.  There is no need for concern and it should clear up in a day or so.  SYMPTOMS TO REPORT IMMEDIATELY:  Following lower endoscopy (colonoscopy or flexible sigmoidoscopy):  Excessive amounts of blood in the stool  Significant tenderness or worsening of abdominal pains  Swelling of the abdomen that is new, acute  Fever of 100F or higher   For urgent or emergent issues, a gastroenterologist can be reached at any hour by calling [phone]. Do not use MyChart messaging for urgent concerns.    DIET:  We do recommend a small meal at first, but then you may proceed to your regular diet.  Drink plenty of fluids but you should avoid alcoholic beverages for 24 hours.  ACTIVITY:  You  should plan to take it easy for the rest of today and you should NOT DRIVE or use heavy machinery until tomorrow (because of the sedation medicines used during the test).    FOLLOW UP: Our staff will call the number listed on your records the next business day following your procedure.  We will call around 7:15- 8:00 am to check on you and address any questions or concerns that you may have regarding the information given to you following your procedure. If we do not reach you, we will leave a message.     If any biopsies were taken you will be contacted by phone or by letter within the next 1-3 weeks.  Please call us at [phone] if you have not heard about the biopsies in 3 weeks.    SIGNATURES/CONFIDENTIALITY: You and/or your care partner have signed paperwork which will be entered into your electronic medical record.  These signatures attest to the fact that that the information above on your After Visit Summary has been reviewed and is understood.  Full responsibility of the confidentiality of this discharge information lies with you and/or your care-partner. 

## 2023-06-29 ENCOUNTER — Telehealth: Payer: Self-pay

## 2023-06-29 NOTE — Telephone Encounter (Signed)
  Follow up Call-     06/28/2023    3:16 PM  Call back number  Post procedure Call Back phone  # [phone]  Permission to leave phone message Yes     Patient questions:  Do you have a fever, pain , or abdominal swelling? No. Pain Score  0 *  Have you tolerated food without any problems? Yes.    Have you been able to return to your normal activities? Yes.    Do you have any questions about your discharge instructions: Diet   No. Medications  No. Follow up visit  No.  Do you have questions or concerns about your Care? No.  Actions: * If pain score is 4 or above: No action needed, pain <4.

## 2023-07-19 DIAGNOSIS — M545 Low back pain, unspecified: Secondary | ICD-10-CM | POA: Diagnosis not present

## 2023-07-20 ENCOUNTER — Encounter (INDEPENDENT_AMBULATORY_CARE_PROVIDER_SITE_OTHER): Payer: Self-pay

## 2023-07-28 DIAGNOSIS — M5416 Radiculopathy, lumbar region: Secondary | ICD-10-CM | POA: Diagnosis not present

## 2023-09-08 ENCOUNTER — Other Ambulatory Visit: Payer: Self-pay

## 2023-09-08 ENCOUNTER — Ambulatory Visit: Attending: Orthopedic Surgery

## 2023-09-08 DIAGNOSIS — R2689 Other abnormalities of gait and mobility: Secondary | ICD-10-CM | POA: Diagnosis not present

## 2023-09-08 DIAGNOSIS — M5459 Other low back pain: Secondary | ICD-10-CM | POA: Insufficient documentation

## 2023-09-08 DIAGNOSIS — M6281 Muscle weakness (generalized): Secondary | ICD-10-CM | POA: Insufficient documentation

## 2023-09-08 NOTE — Therapy (Signed)
 OUTPATIENT PHYSICAL THERAPY THORACOLUMBAR EVALUATION   Patient Name: Ashley York MRN: [id number] DOB:26-Jan-1956, 68 y.o., female Today's Date: 09/08/2023  END OF SESSION:  PT End of Session - 09/08/23 1655     Visit Number 1    Number of Visits 17    Date for PT Re-Evaluation 11/03/23    Authorization Type U[employer] MCR    PT Start Time 1317    PT Stop Time 1357    PT Time Calculation (min) 40 min    Activity Tolerance Patient tolerated treatment well    Behavior During Therapy [facility] for tasks assessed/performed          Past Medical History:  Diagnosis Date   Allergy    CHOLELITHIASIS 05/10/2008   Qualifier: Diagnosis of  By: Zenon Hilda RN, Bartholomew Light     CONSTIPATION 04/23/2008   Qualifier: Diagnosis of  By: Elvin Hammer MD, Murel Arlington    Dyspnea on exertion    Gallstones    GERD (gastroesophageal reflux disease)    Heart murmur    HEMORRHOIDS, INTERNAL 04/19/2008   Qualifier: Diagnosis of  By: Coleridge Davenport     Hyperlipidemia    Hypertension    HYPERTENSION 04/19/2008   Qualifier: Diagnosis of  By: Coleridge Davenport     Hypothyroidism    HYPOTHYROIDISM 04/19/2008   Qualifier: Diagnosis of  By: Coleridge Davenport     Osteoarthritis    Overweight    Pre-diabetes    York apnea    cpap   Past Surgical History:  Procedure Laterality Date   CESAREAN SECTION  1983, 1984   COLONOSCOPY     KNEE SURGERY Right 03/22/2008   TOTAL KNEE ARTHROPLASTY Left 12/01/2020   Procedure: TOTAL KNEE ARTHROPLASTY;  Surgeon: Liliane Rei, MD;  Location: [facility];  Service: Orthopedics;  Laterality: Left;    WISDOM TOOTH EXTRACTION  03/22/1997   Patient Active Problem List   Diagnosis Date Noted   OA (osteoarthritis) of knee 12/01/2020   Metabolic syndrome 12/14/2017   Dyspnea on exertion 11/17/2017   Cardiomegaly 11/17/2017   CHOLELITHIASIS 05/10/2008   CONSTIPATION 04/23/2008   NAUSEA 04/23/2008   FLATULENCE 04/23/2008   HYPOTHYROIDISM 04/19/2008   Essential hypertension  04/19/2008   HEMORRHOIDS, INTERNAL 04/19/2008    PCP: Merl Star, MD  REFERRING PROVIDER: Mort Ards, MD  REFERRING DIAG: M54.50 (ICD-10-CM) - Low back pain, unspecified   Rationale for Evaluation and Treatment: Rehabilitation  THERAPY DIAG:  Other low back pain  Muscle weakness (generalized)  Other abnormalities of gait and mobility  ONSET DATE: Chronic  SUBJECTIVE:  SUBJECTIVE STATEMENT: Pt presents to PT with reports of chronic lower back pain with referral into LE R>L. Notes pain increases with prolonged standing and walking. Some N/T in bilateral LE also R>L. Does aquatic exercise and notes this seem sto help her discomfort some too. Denies bowel/bladder changes or saddle anesthesia.   PERTINENT HISTORY:  HTN  PAIN:  Are you having pain?  Yes: NPRS scale: 2/10 Worst: 7/10 Pain location: lower back,R LE Pain description: sharp, tight Aggravating factors: standing, walking Relieving factors: rest, medication  PRECAUTIONS: Spondylolisthesis   RED FLAGS: None   WEIGHT BEARING RESTRICTIONS: No  FALLS:  Has patient fallen in last 6 months? No  LIVING ENVIRONMENT: Lives with: lives alone Lives in: House/apartment Stairs: No Has following equipment at home: Single point cane  OCCUPATION: Retired  PLOF: Independent  PATIENT GOALS: Pt wants to decrease back pain and improve comfort with standing and walking  OBJECTIVE:  Note: Objective measures were completed at Evaluation unless otherwise noted.  DIAGNOSTIC FINDINGS:  See imaging   PATIENT SURVEYS:  ODI: 18/50 - 36% disability  COGNITION: Overall cognitive status: Within functional limits for tasks assessed     SENSATION: [facility]  MUSCLE LENGTH: Thomas test: Right (+); Left (+)  POSTURE: rounded shoulders,  forward head, and increased lumbar lordosis  PALPATION: TTP to lumbar paraspinals and R gluteals   LUMBAR ROM:   AROM eval  Flexion 50%  Extension 25%  Right lateral flexion   Left lateral flexion   Right rotation   Left rotation    (Blank rows = not tested)  LOWER EXTREMITY MMT:    MMT Right eval Left eval  Hip flexion 3+ 3+  Hip extension    Hip abduction 3+ 3+  Hip adduction    Hip internal rotation    Hip external rotation    Knee flexion 3+ 3+  Knee extension 3+ 3+  Ankle dorsiflexion    Ankle plantarflexion    Ankle inversion    Ankle eversion     (Blank rows = not tested)  LUMBAR SPECIAL TESTS:  Straight leg raise test: Negative and Slump test: Negative  FUNCTIONAL TESTS:  30 Second Sit to Stand: 12 reps with UE  GAIT: Distance walked: 17ft Assistive device utilized: None Level of assistance: Complete Independence Comments: flexed trunk  TREATMENT: [facility] Adult PT Treatment:                                                DATE: 09/08/2023 Therapeutic Exercise: Modified thomas stretch x 30 R Supine PPT x 5 - 5 hold Supine SLR x 5 ea LTR x 5  Hooklying clamshell x 5 GTB  PATIENT EDUCATION:  Education details: eval findings, ODI, HEP, POC Person educated: Patient Education method: Explanation, Demonstration, and Handouts Education comprehension: verbalized understanding and returned demonstration  HOME EXERCISE PROGRAM: Access Code: [email or web address] URL: [email or web address] Date: 09/08/2023 Prepared by: Loral Roch  Exercises - Modified Thomas Stretch  - 1 x daily - 7 x weekly - 2 reps - 30 sec hold - Supine Posterior Pelvic Tilt  - 1 x daily - 7 x weekly - 2 sets - 10 reps - 5 sec hold - Active Straight Leg Raise with Quad Set  - 1 x daily - 7 x weekly - 2 sets - 10 reps - Supine Lower Trunk Rotation  - 1 x daily -  7 x weekly - 10 reps - Hooklying Clamshell with Resistance  - 1 x daily - 7 x weekly - 2-3 sets - 10 reps - green band  hold  ASSESSMENT:  CLINICAL IMPRESSION: Patient is a 68 y.o. F who was seen today for physical therapy evaluation and treatment for chronic LBP with referral into bilateral LE R>L. Physical findings are consistent with MD impression as pt demonstrates decrease in core and proximal hip strength and functional mobility. ODI score shows moderate disability in performance of home ADLs and community activities. Pt would benefit from skilled PT services working on improving core and proximal hip strength in order to decrease pain in gym and aquatic environments.   OBJECTIVE IMPAIRMENTS: Abnormal gait, decreased activity tolerance, decreased mobility, difficulty walking, decreased ROM, decreased strength, postural dysfunction, and pain   ACTIVITY LIMITATIONS: carrying, lifting, standing, squatting, stairs, transfers, and locomotion level  PARTICIPATION LIMITATIONS: meal prep, cleaning, driving, shopping, community activity, occupation, and yard work  PERSONAL FACTORS: Time since onset of injury/illness/exacerbation and 1-2 comorbidities: HTN are also affecting patient's functional outcome.   REHAB POTENTIAL: Good  CLINICAL DECISION MAKING: Evolving/moderate complexity  EVALUATION COMPLEXITY: Moderate   GOALS: Goals reviewed with patient? No  SHORT TERM GOALS: Target date: 09/29/2023   Pt will be compliant and knowledgeable with initial HEP for improved comfort and carryover Baseline: initial HEP given  Goal status: INITIAL  2.  Pt will self report low back pain no greater than 5/10 for improved comfort and functional ability Baseline: 7/10 at worst Goal status: INITIAL   LONG TERM GOALS: Target date: 11/03/2023   Pt will be decrease ODI disability score to no greater than 25% (12/50) as proxy for functional improvement Baseline: 36% disability - 18/50  Goal status: INITIAL  2.  Pt will self report low back pain no greater than 3/10 for improved comfort and functional  ability Baseline: 7/10 at worst Goal status: INITIAL   3.  Pt will increase 30 Second Sit to Stand rep count to no less than 14 reps for improved balance, strength, and functional mobility Baseline: 12 reps with UE Goal status: INITIAL   4.  Pt will improve standing and walking activity tolerance to no less than 30-45 minutes for improved comfort and functional ability with community activities  Baseline: 15 minutes Goal status: INITIAL   PLAN:  PT FREQUENCY: 2x/week  PT DURATION: 8 weeks  PLANNED INTERVENTIONS: 97164- PT Re-evaluation, 97110-Therapeutic exercises, 97530- Therapeutic activity, 97112- Neuromuscular re-education, 97535- Self Care, 16109- Manual therapy, [id number]- Gait training, [phone]- Aquatic Therapy, [phone]- Electrical stimulation (unattended), [id number]- Electrical stimulation (manual), 20560 (1-2 muscles), 20561 (3+ muscles)- Dry Needling, Cryotherapy, and Moist heat  PLAN FOR NEXT SESSION: assess HEP response, neutral spine core strengthening, hip flexor stretching    Ivor Mars, PT 09/08/2023, 5:00 PM

## 2023-09-12 ENCOUNTER — Ambulatory Visit

## 2023-09-12 DIAGNOSIS — M5459 Other low back pain: Secondary | ICD-10-CM | POA: Diagnosis not present

## 2023-09-12 DIAGNOSIS — R2689 Other abnormalities of gait and mobility: Secondary | ICD-10-CM

## 2023-09-12 DIAGNOSIS — M6281 Muscle weakness (generalized): Secondary | ICD-10-CM | POA: Diagnosis not present

## 2023-09-12 NOTE — Therapy (Signed)
 OUTPATIENT PHYSICAL THERAPY TREATMENT   Patient Name: Ashley York MRN: [id number] DOB:10/09/1955, 68 y.o., female Today's Date: 09/12/2023  END OF SESSION:  PT End of Session - 09/12/23 1401     Visit Number 2    Number of Visits 17    Date for PT Re-Evaluation 11/03/23    Authorization Type UHC MCR    PT Start Time 1401    PT Stop Time 1441    PT Time Calculation (min) 40 min    Activity Tolerance Patient tolerated treatment well    Behavior During Therapy [facility] for tasks assessed/performed           Past Medical History:  Diagnosis Date   Allergy    CHOLELITHIASIS 05/10/2008   Qualifier: Diagnosis of  By: Tivis RN, Channing     CONSTIPATION 04/23/2008   Qualifier: Diagnosis of  By: Abran MD, Norleen SAILOR    Dyspnea on exertion    Gallstones    GERD (gastroesophageal reflux disease)    Heart murmur    HEMORRHOIDS, INTERNAL 04/19/2008   Qualifier: Diagnosis of  By: Claudene GUSS Railing     Hyperlipidemia    Hypertension    HYPERTENSION 04/19/2008   Qualifier: Diagnosis of  By: Claudene GUSS Railing     Hypothyroidism    HYPOTHYROIDISM 04/19/2008   Qualifier: Diagnosis of  By: Claudene GUSS Railing     Osteoarthritis    Overweight    Pre-diabetes    Sleep apnea    cpap   Past Surgical History:  Procedure Laterality Date   CESAREAN SECTION  1983, 1984   COLONOSCOPY     KNEE SURGERY Right 03/22/2008   TOTAL KNEE ARTHROPLASTY Left 12/01/2020   Procedure: TOTAL KNEE ARTHROPLASTY;  Surgeon: Melodi Lerner, MD;  Location: [facility];  Service: Orthopedics;  Laterality: Left;    WISDOM TOOTH EXTRACTION  03/22/1997   Patient Active Problem List   Diagnosis Date Noted   OA (osteoarthritis) of knee 12/01/2020   Metabolic syndrome 12/14/2017   Dyspnea on exertion 11/17/2017   Cardiomegaly 11/17/2017   CHOLELITHIASIS 05/10/2008   CONSTIPATION 04/23/2008   NAUSEA 04/23/2008   FLATULENCE 04/23/2008   HYPOTHYROIDISM 04/19/2008   Essential hypertension 04/19/2008    HEMORRHOIDS, INTERNAL 04/19/2008    PCP: Rexanne Ingle, MD  REFERRING PROVIDER: Burnetta Aures, MD  REFERRING DIAG: M54.50 (ICD-10-CM) - Low back pain, unspecified   Rationale for Evaluation and Treatment: Rehabilitation  THERAPY DIAG:  Other low back pain  Muscle weakness (generalized)  Other abnormalities of gait and mobility  ONSET DATE: Chronic  SUBJECTIVE:  SUBJECTIVE STATEMENT: Pt presents to PT with reports of decreased LBP today at 2x10. Has been compliant with initial HEP with no adverse effect.   EVAL: Pt presents to PT with reports of chronic lower back pain with referral into LE R>L. Notes pain increases with prolonged standing and walking. Some N/T in bilateral LE also R>L. Does aquatic exercise and notes this seem sto help her discomfort some too. Denies bowel/bladder changes or saddle anesthesia.   PERTINENT HISTORY:  HTN  PAIN:  Are you having pain?  Yes: NPRS scale: 2/10 Worst: 7/10 Pain location: lower back,R LE Pain description: sharp, tight Aggravating factors: standing, walking Relieving factors: rest, medication  PRECAUTIONS: Spondylolisthesis   RED FLAGS: None   WEIGHT BEARING RESTRICTIONS: No  FALLS:  Has patient fallen in last 6 months? No  LIVING ENVIRONMENT: Lives with: lives alone Lives in: House/apartment Stairs: No Has following equipment at home: Single point cane  OCCUPATION: Retired  PLOF: Independent  PATIENT GOALS: Pt wants to decrease back pain and improve comfort with standing and walking  OBJECTIVE:  Note: Objective measures were completed at Evaluation unless otherwise noted.  DIAGNOSTIC FINDINGS:  See imaging   PATIENT SURVEYS:  ODI: 18/50 - 36% disability  COGNITION: Overall cognitive status: Within functional limits for  tasks assessed     SENSATION: [facility]  MUSCLE LENGTH: Thomas test: Right (+); Left (+)  POSTURE: rounded shoulders, forward head, and increased lumbar lordosis  PALPATION: TTP to lumbar paraspinals and R gluteals   LUMBAR ROM:   AROM eval  Flexion 50%  Extension 25%  Right lateral flexion   Left lateral flexion   Right rotation   Left rotation    (Blank rows = not tested)  LOWER EXTREMITY MMT:    MMT Right eval Left eval  Hip flexion 3+ 3+  Hip extension    Hip abduction 3+ 3+  Hip adduction    Hip internal rotation    Hip external rotation    Knee flexion 3+ 3+  Knee extension 3+ 3+  Ankle dorsiflexion    Ankle plantarflexion    Ankle inversion    Ankle eversion     (Blank rows = not tested)  LUMBAR SPECIAL TESTS:  Straight leg raise test: Negative and Slump test: Negative  FUNCTIONAL TESTS:  30 Second Sit to Stand: 12 reps with UE  GAIT: Distance walked: 54ft Assistive device utilized: None Level of assistance: Complete Independence Comments: flexed trunk  TREATMENT: [facility] Adult PT Treatment:                                                DATE: 09/12/2023 NuStep lvl 5 UE/LE x 4 min while taking subjective Supine PPT x 10 - 5 hold Supine PPT with ball 2x10 - 3 Supine SLR 2x10  Supine PPT with march 2x10 Repeated lumbar flex with swiss ball in sitting 2x10  Repeated lateral flexion with swiss x 10 ea Modified thomas stretch x 60 ea Standing hip abd/ext 2x10  [facility] Adult PT Treatment:                                                DATE: 09/08/2023 Therapeutic Exercise: Modified thomas stretch x 30 R Supine PPT  x 5 - 5 hold Supine SLR x 5 ea LTR x 5  Hooklying clamshell x 5 GTB  PATIENT EDUCATION:  Education details: eval findings, ODI, HEP, POC Person educated: Patient Education method: Explanation, Demonstration, and Handouts Education comprehension: verbalized understanding and returned demonstration  HOME EXERCISE PROGRAM: Access Code:  [email or web address] URL: [email or web address] Date: 09/12/2023 Prepared by: Alm Kingdom  Exercises - Modified Debby Stretch  - 1 x daily - 7 x weekly - 2 reps - 30 sec hold - Supine Posterior Pelvic Tilt  - 1 x daily - 7 x weekly - 2 sets - 10 reps - 5 sec hold - Active Straight Leg Raise with Quad Set  - 1 x daily - 7 x weekly - 2 sets - 10 reps - Supine Lower Trunk Rotation  - 1 x daily - 7 x weekly - 10 reps - Hooklying Clamshell with Resistance  - 1 x daily - 7 x weekly - 2-3 sets - 10 reps - green band hold - Standing Hip Abduction with Counter Support  - 1 x daily - 7 x weekly - 2 sets - 10 reps - Standing Hip Extension with Counter Support  - 1 x daily - 7 x weekly - 2 sets - 10 reps  ASSESSMENT:  CLINICAL IMPRESSION: Pt was able to complete all prescribed exercises with no adverse effect. Exercises today focused on progression of core and proximal hip strength as well as improving lumbar mobility. HEP updated for progression of proximal hip. Continues to benefit from skilled PT, will continue to progress as able.   EVAL: Patient is a 68 y.o. F who was seen today for physical therapy evaluation and treatment for chronic LBP with referral into bilateral LE R>L. Physical findings are consistent with MD impression as pt demonstrates decrease in core and proximal hip strength and functional mobility. ODI score shows moderate disability in performance of home ADLs and community activities. Pt would benefit from skilled PT services working on improving core and proximal hip strength in order to decrease pain in gym and aquatic environments.   OBJECTIVE IMPAIRMENTS: Abnormal gait, decreased activity tolerance, decreased mobility, difficulty walking, decreased ROM, decreased strength, postural dysfunction, and pain   ACTIVITY LIMITATIONS: carrying, lifting, standing, squatting, stairs, transfers, and locomotion level  PARTICIPATION LIMITATIONS: meal prep, cleaning, driving, shopping,  community activity, occupation, and yard work  PERSONAL FACTORS: Time since onset of injury/illness/exacerbation and 1-2 comorbidities: HTN are also affecting patient's functional outcome.   REHAB POTENTIAL: Good  CLINICAL DECISION MAKING: Evolving/moderate complexity  EVALUATION COMPLEXITY: Moderate   GOALS: Goals reviewed with patient? No  SHORT TERM GOALS: Target date: 09/29/2023   Pt will be compliant and knowledgeable with initial HEP for improved comfort and carryover Baseline: initial HEP given  Goal status: INITIAL  2.  Pt will self report low back pain no greater than 5/10 for improved comfort and functional ability Baseline: 7/10 at worst Goal status: INITIAL   LONG TERM GOALS: Target date: 11/03/2023   Pt will be decrease ODI disability score to no greater than 25% (12/50) as proxy for functional improvement Baseline: 36% disability - 18/50  Goal status: INITIAL  2.  Pt will self report low back pain no greater than 3/10 for improved comfort and functional ability Baseline: 7/10 at worst Goal status: INITIAL   3.  Pt will increase 30 Second Sit to Stand rep count to no less than 14 reps for improved balance, strength, and functional mobility Baseline: 12 reps with UE Goal  status: INITIAL   4.  Pt will improve standing and walking activity tolerance to no less than 30-45 minutes for improved comfort and functional ability with community activities  Baseline: 15 minutes Goal status: INITIAL   PLAN:  PT FREQUENCY: 2x/week  PT DURATION: 8 weeks  PLANNED INTERVENTIONS: 97164- PT Re-evaluation, 97110-Therapeutic exercises, 97530- Therapeutic activity, [id number]- Neuromuscular re-education, 97535- Self Care, [id number]- Manual therapy, [id number]- Gait training, [phone]- Aquatic Therapy, [phone]- Electrical stimulation (unattended), [id number]- Electrical stimulation (manual), 20560 (1-2 muscles), 20561 (3+ muscles)- Dry Needling, Cryotherapy, and Moist heat  PLAN FOR NEXT SESSION:  assess HEP response, neutral spine core strengthening, hip flexor stretching   Date of referral: 08/24/2023 Referring provider: Burnetta Aures, MD Referring diagnosis? M54.50 (ICD-10-CM) - Low back pain, unspecified  Treatment diagnosis? (if different than referring diagnosis) Other low back pain Muscle weakness (generalized) Other abnormalities of gait and mobility  What was this (referring dx) caused by? Arthritis  Nature of Condition: Chronic (continuous duration > 3 months)   Laterality: Both  Current Functional Measure Score:  ODI: 18/50 - 36% disability  Objective measurements identify impairments when they are compared to normal values, the uninvolved extremity, and prior level of function.  [x]  Yes  []  No  Objective assessment of functional ability: Moderate functional limitations   Briefly describe symptoms:  Pt presents to PT with reports of chronic lower back pain with referral into LE R>L. Notes pain increases with prolonged standing and walking. Some N/T in bilateral LE also R>L. Does aquatic exercise and notes this seem sto help her discomfort some too. Denies bowel/bladder changes or saddle anesthesia.   How did symptoms start: Chronic onset with none trauma, slowly increasing  Average pain intensity:  Last 24 hours: 2/10  Past week: 7/10  How often does the pt experience symptoms? Frequently  How much have the symptoms interfered with usual daily activities? Moderately  How has condition changed since care began at this facility? NA - initial visit  In general, how is the patients overall health? Very Good   BACK PAIN (STarT Back Screening Tool) No   Alm JAYSON Kingdom, PT 09/12/2023, 2:44 PM

## 2023-09-14 ENCOUNTER — Ambulatory Visit (INDEPENDENT_AMBULATORY_CARE_PROVIDER_SITE_OTHER): Admitting: Adult Health

## 2023-09-14 ENCOUNTER — Encounter (INDEPENDENT_AMBULATORY_CARE_PROVIDER_SITE_OTHER): Payer: Self-pay | Admitting: Adult Health

## 2023-09-14 VITALS — BP 127/72 | HR 83 | Temp 98.2°F | Ht 65.5 in | Wt 262.0 lb

## 2023-09-14 DIAGNOSIS — I10 Essential (primary) hypertension: Secondary | ICD-10-CM | POA: Diagnosis not present

## 2023-09-14 DIAGNOSIS — E8881 Metabolic syndrome: Secondary | ICD-10-CM | POA: Diagnosis not present

## 2023-09-14 DIAGNOSIS — E039 Hypothyroidism, unspecified: Secondary | ICD-10-CM | POA: Diagnosis not present

## 2023-09-14 DIAGNOSIS — Z6841 Body Mass Index (BMI) 40.0 and over, adult: Secondary | ICD-10-CM

## 2023-09-14 DIAGNOSIS — I517 Cardiomegaly: Secondary | ICD-10-CM | POA: Diagnosis not present

## 2023-09-14 DIAGNOSIS — Z0289 Encounter for other administrative examinations: Secondary | ICD-10-CM

## 2023-09-14 NOTE — Addendum Note (Signed)
 Addended by: JONEL PEE D on: 09/14/2023 04:12 PM   Modules accepted: Level of Service

## 2023-09-14 NOTE — Progress Notes (Addendum)
 Office: [phone]  /  Fax: [phone]   Initial Visit    Ashley York was seen in clinic today to evaluate for obesity. She is interested in losing weight to improve overall health and reduce the risk of weight related complications. She presents today to review program treatment options, initial physical assessment, and evaluation.     She was referred by: Specialist  When asked what else they would like to accomplish? She states: Adopt a healthier eating pattern and lifestyle, Improve energy levels and physical activity, Improve existing medical conditions, Reduce number of medications, Improve quality of life, and Current weight 262 lbs, Goal Weight 165 lbs  When asked how has your weight affected you? She states: Contributed to medical problems, Contributed to orthopedic problems or mobility issues, Having fatigue, Having poor endurance, and Problems with eating patterns  Weight history: Steady weight gain since late 40s/early 50s  Highest weight: 275 lbs  Some associated conditions: Hypertension and Hyperlipidemia  Contributing factors: family history of obesity, disruption of circadian rhythm / sleep disordered breathing, consumption of processed foods, moderate to high levels of stress, reduced physical activity, and menopause  Weight promoting medications identified: None  Prior weight loss attempts: None  Current nutrition plan: None  Current level of physical activity: Aquatics 60 minutes, three  Current or previous pharmacotherapy: GLP-1  Response to medication: Briefly on Ozempic in 2023/2024- only lost 10 lbs- would like to try again.  She denies SE with GLP-1 therapy   Past medical history includes:   Past Medical History:  Diagnosis Date   Allergy    CHOLELITHIASIS 05/10/2008   Qualifier: Diagnosis of  By: Tivis RN, Channing     CONSTIPATION 04/23/2008   Qualifier: Diagnosis of  By: Abran MD, Norleen SAILOR    Dyspnea on exertion    Gallstones    GERD  (gastroesophageal reflux disease)    Heart murmur    HEMORRHOIDS, INTERNAL 04/19/2008   Qualifier: Diagnosis of  By: Claudene GUSS Railing     Hyperlipidemia    Hypertension    HYPERTENSION 04/19/2008   Qualifier: Diagnosis of  By: Claudene GUSS Railing     Hypothyroidism    HYPOTHYROIDISM 04/19/2008   Qualifier: Diagnosis of  By: Claudene GUSS Railing     Osteoarthritis    Overweight    Pre-diabetes    Sleep apnea    cpap     Objective    BP 127/72   Pulse 83   Temp 98.2 F (36.8 C)   Ht 5' 5.5 (1.664 m)   Wt 262 lb (118.8 kg)   SpO2 98%   BMI 42.94 kg/m  She was weighed on the bioimpedance scale: Body mass index is 42.94 kg/m.  Body Fat%:53.4, Visceral Fat Rating:19, Weight trend over the last 12 months: Increasing/Decreasing  General:  Alert, oriented and cooperative. Patient is in no acute distress.  Respiratory: Normal respiratory effort, no problems with respiration noted   Gait: able to ambulate independently  Mental Status: Normal mood and affect. Normal behavior. Normal judgment and thought content.   DIAGNOSTIC DATA REVIEWED:  BMET    Component Value Date/Time   NA 141 12/02/2020 0304   K 3.7 12/02/2020 0304   CL 106 12/02/2020 0304   CO2 26 12/02/2020 0304   GLUCOSE 158 (H) 12/02/2020 0304   BUN 19 12/02/2020 0304   CREATININE 0.70 12/02/2020 0304   CALCIUM  9.0 12/02/2020 0304   GFRNONAA >60 12/02/2020 0304   GFRAA  10/24/2008 1246    >  60        The eGFR has been calculated using the MDRD equation. This calculation has not been validated in all clinical situations. eGFR's persistently <60 mL/min signify possible Chronic Kidney Disease.   Lab Results  Component Value Date   HGBA1C 6.0 (H) 11/19/2020   No results found for: INSULIN CBC    Component Value Date/Time   WBC 12.1 (H) 12/02/2020 0304   RBC 3.72 (L) 12/02/2020 0304   HGB 9.7 (L) 12/02/2020 0304   HCT 31.0 (L) 12/02/2020 0304   PLT 211 12/02/2020 0304   MCV 83.3 12/02/2020  0304   MCH 26.1 12/02/2020 0304   MCHC 31.3 12/02/2020 0304   RDW 15.3 12/02/2020 0304   Iron/TIBC/Ferritin/ %Sat No results found for: IRON, TIBC, FERRITIN, IRONPCTSAT Lipid Panel  No results found for: CHOL, TRIG, HDL, CHOLHDL, VLDL, LDLCALC, LDLDIRECT Hepatic Function Panel     Component Value Date/Time   PROT 7.2 01/12/2023 1501   ALBUMIN 4.1 01/12/2023 1501   AST 17 01/12/2023 1501   ALT 9 01/12/2023 1501   ALKPHOS 94 01/12/2023 1501   BILITOT 0.3 01/12/2023 1501   BILIDIR <0.10 01/12/2023 1501   No results found for: TSH   Assessment and Plan   Essential hypertension  Cardiomegaly  Hypothyroidism, unspecified type  Metabolic syndrome  Morbid obesity (HCC), STARTING BMI 43.0    Assessment and Plan          ESTABLISH WITH [facility]    Obesity Treatment / Action Plan:  Patient will work on garnering support from family and friends to begin weight loss journey. Will work on eliminating or reducing the presence of highly palatable, calorie dense foods in the home. Will complete provided nutritional and psychosocial assessment questionnaire before the next appointment. Will be scheduled for indirect calorimetry to determine resting energy expenditure in a fasting state.  This will allow us  to create a reduced calorie, high-protein meal plan to promote loss of fat mass while preserving muscle mass. Will think about ideas on how to incorporate physical activity into their daily routine. Counseled on the health benefits of losing 5%-15% of total body weight. Was counseled on nutritional approaches to weight loss and benefits of reducing processed foods and consuming plant-based foods and high quality protein as part of nutritional weight management. Was counseled on pharmacotherapy and role as an adjunct in weight management.   Obesity Education Performed Today:  She was weighed on the bioimpedance scale and results were discussed and documented  in the synopsis.  We discussed obesity as a disease and the importance of a more detailed evaluation of all the factors contributing to the disease.  We discussed the importance of long term lifestyle changes which include nutrition, exercise and behavioral modifications as well as the importance of customizing this to her specific health and social needs.  We discussed the benefits of reaching a healthier weight to alleviate the symptoms of existing conditions and reduce the risks of the biomechanical, metabolic and psychological effects of obesity.  We reviewed the four pillars of obesity medicine and importance of using a multimodal approach.  We reviewed the basic principles in weight management.   AIREAL SLATER appears to be in the action stage of change and states they are ready to start intensive lifestyle modifications and behavioral modifications.  I have spent 27 minutes in the care of the patient today including: 5 minutes before the visit reviewing and preparing the chart. 18 minutes face-to-face assessing and reviewing listed  medical problems as outlined in obesity care plan, providing nutritional and behavioral counseling on topics outlined in the obesity care plan, independently interpreting test results and goals of care, as described in assessment and plan, and reviewing and discussing biometric information and progress 4 minutes after the visit updating chart and documentation of encounter.  Reviewed by clinician on day of visit: allergies, medications, problem list, medical history, surgical history, family history, social history, and previous encounter notes pertinent to obesity diagnosis.  Deysy Schabel d. Rylin Seavey, NP-C

## 2023-09-15 ENCOUNTER — Encounter (INDEPENDENT_AMBULATORY_CARE_PROVIDER_SITE_OTHER): Payer: Self-pay

## 2023-09-19 ENCOUNTER — Ambulatory Visit (INDEPENDENT_AMBULATORY_CARE_PROVIDER_SITE_OTHER): Admitting: Internal Medicine

## 2023-09-19 ENCOUNTER — Encounter (INDEPENDENT_AMBULATORY_CARE_PROVIDER_SITE_OTHER): Payer: Self-pay | Admitting: Internal Medicine

## 2023-09-19 VITALS — BP 136/82 | HR 68 | Temp 98.0°F | Ht 65.5 in | Wt 263.0 lb

## 2023-09-19 DIAGNOSIS — R400 Somnolence: Secondary | ICD-10-CM | POA: Insufficient documentation

## 2023-09-19 DIAGNOSIS — E039 Hypothyroidism, unspecified: Secondary | ICD-10-CM

## 2023-09-19 DIAGNOSIS — Z1331 Encounter for screening for depression: Secondary | ICD-10-CM | POA: Diagnosis not present

## 2023-09-19 DIAGNOSIS — D649 Anemia, unspecified: Secondary | ICD-10-CM | POA: Insufficient documentation

## 2023-09-19 DIAGNOSIS — R7303 Prediabetes: Secondary | ICD-10-CM | POA: Diagnosis not present

## 2023-09-19 DIAGNOSIS — G4733 Obstructive sleep apnea (adult) (pediatric): Secondary | ICD-10-CM | POA: Diagnosis not present

## 2023-09-19 DIAGNOSIS — E559 Vitamin D deficiency, unspecified: Secondary | ICD-10-CM | POA: Insufficient documentation

## 2023-09-19 DIAGNOSIS — R0602 Shortness of breath: Secondary | ICD-10-CM

## 2023-09-19 DIAGNOSIS — E7800 Pure hypercholesterolemia, unspecified: Secondary | ICD-10-CM | POA: Diagnosis not present

## 2023-09-19 DIAGNOSIS — I10 Essential (primary) hypertension: Secondary | ICD-10-CM

## 2023-09-19 DIAGNOSIS — R5383 Other fatigue: Secondary | ICD-10-CM | POA: Diagnosis not present

## 2023-09-19 DIAGNOSIS — E669 Obesity, unspecified: Secondary | ICD-10-CM

## 2023-09-19 NOTE — Assessment & Plan Note (Signed)
 Detected on old labs.  She probably had blood work done outside of our network but she is not sure what been done for the anemia.  Because of her daytime fatigue and somnolence we will check a CBC and iron studies.  Considering age ,we will also make sure that she is up-to-date with endoscopic evaluation if indicated.

## 2023-09-19 NOTE — Progress Notes (Signed)
 [address] [place],  [place], [place] [address]  Office: [phone]  /  Fax: [phone]   Subjective   Initial Visit  Ashley York (MR# [id number]) is a 68 y.o. female who presents for evaluation and treatment of obesity and related comorbidities. Current BMI is Body mass index is 43.1 kg/m. [name] has been struggling with her weight for many years and has been unsuccessful in either losing weight, maintaining weight loss, or reaching her healthy weight goal.  [name] is currently in the action stage of change and ready to dedicate time achieving and maintaining a healthier weight. [name] is interested in becoming our patient and working on intensive lifestyle modifications including (but not limited to) diet and exercise for weight loss.  Weight history:  When asked how their weight has affected their life and health, she states: Contributed to medical problems, Having fatigue, and Having poor endurance  When asked what else they would like to accomplish? She states: Adopt a healthier eating pattern and lifestyle, Improve energy levels and physical activity, Improve existing medical conditions, and Improve quality of life  She starting to note weight gain during : adulthood  Life events associated with weight gain include : pregnancy.   Other contributing factors: family history of obesity, disruption of circadian rhythm / sleep disordered breathing, consumption of processed foods, reduced physical activity, chronic skipping of meals, and menopause.  Their highest weight has been:  276 lbs.  Desired weight: 165  Previous weight-loss programs : None.  Their maximum weight loss was:  NA lbs.  Their greatest challenge with dieting: meal preparation and cooking.  Current or previous pharmacotherapy: GLP-1. Has been getting Ozempic from a friend who has diabetes, I advised today against that approach  Response to medication: Lost weight initially but was unable to sustain  weight loss   Nutritional History:  Current nutrition plan: None.  How many times do you eat outside the home: 2-4 per week  How often do they skip meals: skips breakfast  What beverages do they drink: water , diet soda , juice, smoothies, and sweet tea .   Use of artificial sweetners : Yes  Food intolerances or dislikes: none.  Food triggers: Seeking reward.  Food cravings: Sugary and Starches / Carbohydrates  Do they struggle with excessive hunger or portion control : No    Physical Activity:  Current level of physical activity: Aquatics 60 minutes, three, 4300 steps per day  Barriers to Exercise: Back pain   Past medical history includes:   Past Medical History:  Diagnosis Date   Allergy    Back pain    CHOLELITHIASIS 05/10/2008   Qualifier: Diagnosis of  By: Tivis RN, Channing     CONSTIPATION 04/23/2008   Qualifier: Diagnosis of  By: Abran MD, Norleen SAILOR    Dyspnea on exertion    Gallstones    GERD (gastroesophageal reflux disease)    Heart murmur    HEMORRHOIDS, INTERNAL 04/19/2008   Qualifier: Diagnosis of  By: Claudene GUSS Railing     Hyperlipidemia    Hypertension    HYPERTENSION 04/19/2008   Qualifier: Diagnosis of  By: Claudene GUSS Railing     Hypothyroidism    HYPOTHYROIDISM 04/19/2008   Qualifier: Diagnosis of  By: Claudene GUSS Railing     Joint pain    Osteoarthritis    Overweight    Pre-diabetes    Sleep apnea    cpap   SOB (shortness of breath)    Spondylosis    Vitamin  B 12 deficiency    Vitamin D deficiency      Objective   BP 136/82   Pulse 68   Temp 98 F (36.7 C)   Ht 5' 5.5 (1.664 m)   Wt 263 lb (119.3 kg)   SpO2 99%   BMI 43.10 kg/m  She was weighed on the bioimpedance scale: Body mass index is 43.1 kg/m.    Anthropometrics:  Vitals Temp: 98 F (36.7 C) BP: 136/82 Pulse Rate: 68 SpO2: 99 %   Anthropometric Measurements Height: 5' 5.5 (1.664 m) Weight: 263 lb (119.3 kg) BMI (Calculated): 43.08 Starting Weight:  263 lb Peak Weight: 270 lb Waist Measurement : 50 inches   Body Composition  Body Fat %: 54.4 % Fat Mass (lbs): 143 lbs Muscle Mass (lbs): 114 lbs Visceral Fat Rating : 19   Other Clinical Data RMR: 1627 Fasting: yes Labs: yes Today's Visit #: 1 Starting Date: 09/19/23    Physical Exam:  General: She is overweight, cooperative, alert, well developed, and in no acute distress. PSYCH: Has normal mood, affect and thought process.   HEENT: EOMI, sclerae are anicteric. Lungs: Normal breathing effort, no conversational dyspnea. Extremities: No edema.  Neurologic: No gross sensory or motor deficits. No tremors or fasciculations noted.    Diagnostic Data Reviewed  EKG: Normal sinus rhythm, rate 66. No conduction abnormalities, abnormal Q waves or chamber enlargement.  Indirect Calorimeter completed today shows a VO2 of 237 and a REE of 1627.  Her calculated basal metabolic rate is 8255 thus her resting energy expenditure same as calculated.  Depression Screen  Kameka's PHQ-9 score was: 1.     09/19/2023    8:06 AM  Depression screen PHQ 2/9  Decreased Interest 0  Down, Depressed, Hopeless 0  PHQ - 2 Score 0  Altered sleeping 0  Tired, decreased energy 1  Change in appetite 0  Feeling bad or failure about yourself  0  Trouble concentrating 0  Moving slowly or fidgety/restless 0  Suicidal thoughts 0  PHQ-9 Score 1  Difficult doing work/chores Not difficult at all    Screening for Sleep Related Breathing Disorders  Raylie admits to daytime somnolence and admits to waking up still tired. Patient does not have a history of symptoms of OSA. Donnajean generally gets 5 or 6 hours of sleep per night, and states that she has generally restful sleep. Snoring is present. Apneic episodes are not present. Epworth Sleepiness Score is 17.    BMET    Component Value Date/Time   NA 141 12/02/2020 0304   K 3.7 12/02/2020 0304   CL 106 12/02/2020 0304   CO2 26 12/02/2020  0304   GLUCOSE 158 (H) 12/02/2020 0304   BUN 19 12/02/2020 0304   CREATININE 0.70 12/02/2020 0304   CALCIUM  9.0 12/02/2020 0304   GFRNONAA >60 12/02/2020 0304   GFRAA  10/24/2008 1246    >60        The eGFR has been calculated using the MDRD equation. This calculation has not been validated in all clinical situations. eGFR's persistently <60 mL/min signify possible Chronic Kidney Disease.   Lab Results  Component Value Date   HGBA1C 6.0 (H) 11/19/2020   No results found for: INSULIN CBC    Component Value Date/Time   WBC 12.1 (H) 12/02/2020 0304   RBC 3.72 (L) 12/02/2020 0304   HGB 9.7 (L) 12/02/2020 0304   HCT 31.0 (L) 12/02/2020 0304   PLT 211 12/02/2020 0304   MCV 83.3 12/02/2020  0304   MCH 26.1 12/02/2020 0304   MCHC 31.3 12/02/2020 0304   RDW 15.3 12/02/2020 0304   Iron/TIBC/Ferritin/ %Sat No results found for: IRON, TIBC, FERRITIN, IRONPCTSAT Lipid Panel  No results found for: CHOL, TRIG, HDL, CHOLHDL, VLDL, LDLCALC, LDLDIRECT Hepatic Function Panel     Component Value Date/Time   PROT 7.2 01/12/2023 1501   ALBUMIN 4.1 01/12/2023 1501   AST 17 01/12/2023 1501   ALT 9 01/12/2023 1501   ALKPHOS 94 01/12/2023 1501   BILITOT 0.3 01/12/2023 1501   BILIDIR <0.10 01/12/2023 1501   No results found for: TSH   Assessment and Plan   TREATMENT PLAN FOR OBESITY:  Recommended Dietary Goals  Shay is currently in the action stage of change. As such, her goal is to implement medically supervised obesity management plan.  She has agreed to implement: the Category 2 plan - 1200 kcal per day  Behavioral Intervention  We discussed the following Behavioral Modification Strategies today: increasing lean protein intake to established goals, decreasing simple carbohydrates , increasing vegetables, increasing lower glycemic fruits, increasing fiber rich foods, avoiding skipping meals, increasing water  intake, work on meal planning and  preparation, reading food labels , keeping healthy foods at home, identifying sources and decreasing liquid calories, decreasing eating out or consumption of processed foods, and making healthy choices when eating convenient foods, planning for success, and better snacking choices  Additional resources provided today: Handout on healthy eating and balanced plate, Handout on complex carbohydrates and lean sources of protein, Category 2 packet, and principles of weight management  Recommended Physical Activity Goals  Mailynn has been advised to work up to 150 minutes of moderate intensity aerobic activity a week and strengthening exercises 2-3 times per week for cardiovascular health, weight loss maintenance and preservation of muscle mass.   She has agreed to :  Continue with aqua exercises for now  Medical Interventions and Pharmacotherapy We will work on building a Therapist, art and behavioral strategies. We will discuss the role of pharmacotherapy as an adjunct at subsequent visits.   ASSOCIATED CONDITIONS ADDRESSED TODAY  Other Fatigue Raymie admits to daytime somnolence and admits to waking up still tired. Patient does not have a history of symptoms of OSA. Genesee generally gets 5 or 6 hours of sleep per night, and states that she has generally restful sleep. Snoring is present. Apneic episodes are not present. Epworth Sleepiness Score is 17. Shauntavia does feel that her weight is causing her energy to be lower than it should be. Fatigue may be related to obesity, depression or many other causes. Labs will be ordered, and in the meanwhile, Yelena will focus on self care including making healthy food choices, increasing physical activity and focusing on stress reduction.  Shortness of Breath Adasia notes increasing shortness of breath with physical activity and seems to be worsening over time with weight gain. She notes getting out of breath sooner with activity  than she used to. This has not gotten worse recently. Jaylyne denies shortness of breath at rest or orthopnea.  Other fatigue -     EKG 12-Lead  SOB (shortness of breath) on exertion  Depression screen  Essential hypertension Assessment & Plan: Blood pressure close to goal for age and risk category.  On amlodipine  and ARB hydrochlorothiazide  without adverse effects.  Check renal parameters today.  Continue with weight loss therapy. Losing 10% may improve blood pressure control. Monitor for symptoms of orthostasis while losing weight. Continue current regimen and home  monitoring for a goal blood pressure of 120/80.    Orders: -     Comprehensive metabolic panel with GFR  Prediabetes Assessment & Plan: Most recent A1c is  Lab Results  Component Value Date   HGBA1C 6.0 (H) 11/19/2020    Patient aware of disease state and risk of progression. This may contribute to abnormal cravings, fatigue and diabetic complications without having diabetes.   We have discussed treatment options which include: losing 7 to 10% of body weight, increasing physical activity to a goal of 150 minutes a week at moderate intensity.  Advised to maintain a diet low on simple and processed carbohydrates.  Educated on the carb insulin model for obesity  Patient has been taking someone else's Ozempic, person has diabetes.  We discussed that this is not recommended and could increase her risk of GLP-1 related adverse effects   Orders: -     Hemoglobin A1c -     Insulin, random  OSA (obstructive sleep apnea) - mild on CPAP Assessment & Plan: Reviewed sleep study in the past for sleep apnea was mild for daytime somnolence is out of proportion.  Suspect she may have either narcolepsy or other causes of daytime fatigue and somnolence.  This may be related to insulin resistance as well.  We will check serologies, please refer to orders.  May benefit from a follow-up sleep study to determine if current treatment  is effective.  Losing 15% of body weight may reduce AHI   Pure hypercholesterolemia Assessment & Plan: She is currently on atorvastatin .  We will check a fasting lipid profile and assess cardiovascular risk and make sure that this is optimized.  I have reviewed previous EKGs, echocardiograms.  She has normal diastolic function and left ventricular function.  Orders: -     TSH -     Lipid Panel With LDL/HDL Ratio  Anemia, unspecified type Assessment & Plan: Detected on old labs.  She probably had blood work done outside of our network but she is not sure what been done for the anemia.  Because of her daytime fatigue and somnolence we will check a CBC and iron studies.  Considering age ,we will also make sure that she is up-to-date with endoscopic evaluation if indicated.  Orders: -     Vitamin B12 -     CBC with Differential/Platelet -     Ferritin -     Iron, TIBC and Ferritin Panel  Vitamin D deficiency Assessment & Plan: Check vitamin D levels  Orders: -     VITAMIN D 25 Hydroxy (Vit-D Deficiency, Fractures)  Daytime somnolence Assessment & Plan: As above   Hypothyroidism, unspecified type Assessment & Plan: Check TSH     Follow-up  She was informed of the importance of frequent follow-up visits to maximize her success with intensive lifestyle modifications for her multiple health conditions. She was informed we would discuss her lab results at her next visit unless there is a critical issue that needs to be addressed sooner. Bernisha agreed to keep her next visit at the agreed upon time to discuss these results.  Attestation Statement  This is the patient's intake visit at [facility]. The patient's Health Questionnaire was reviewed at length. Included in the packet: current and past health history, medications, allergies, ROS, gynecologic history (women only), surgical history, family history, social history, weight history, weight loss surgery  history (for those that have had weight loss surgery), nutritional evaluation, mood and food questionnaire, PHQ9,  Epworth questionnaire, sleep habits questionnaire, patient life and health improvement goals questionnaire. These will all be scanned into the patient's chart under media.   During the visit, I independently reviewed the patient's EKG, previous labs, bioimpedance scale results, and indirect calorimetry results. I used this information to medically tailor a meal plan for the patient that will help her to lose weight and will improve her obesity-related conditions. I performed a medically necessary appropriate examination and/or evaluation. I discussed the assessment and treatment plan with the patient. The patient was provided an opportunity to ask questions and all were answered. The patient agreed with the plan and demonstrated an understanding of the instructions. Labs were ordered at this visit and will be reviewed at the next visit unless critical results need to be addressed immediately. Clinical information was updated and documented in the EMR.   In addition, they received basic education on identification of processed foods and reduction of these, different sources of lean proteins and complex carbohydrates and how to eat balanced by incorporation of whole foods.  Reviewed by clinician on day of visit: allergies, medications, problem list, medical history, surgical history, family history, social history, and previous encounter notes.  I have spent 61 minutes in the care of the patient today including: 7 minutes before the visit reviewing and preparing the chart. 40 minutes face-to-face assessing and reviewing listed medical problems as outlined in obesity care plan, providing nutritional and behavioral counseling on topics outlined in the obesity care plan, counseling regarding anti-obesity medication as outlined in obesity care plan, independently interpreting test results and goals  of care, as described in assessment and plan, reviewing and discussing biometric information and progress, and ordering diagnostics - see orders 14 minutes after the visit updating chart and documentation of encounter.       Lucas Parker, MD

## 2023-09-19 NOTE — Assessment & Plan Note (Signed)
 She is currently on atorvastatin .  We will check a fasting lipid profile and assess cardiovascular risk and make sure that this is optimized.  I have reviewed previous EKGs, echocardiograms.  She has normal diastolic function and left ventricular function.

## 2023-09-19 NOTE — Assessment & Plan Note (Signed)
 Blood pressure close to goal for age and risk category.  On amlodipine  and ARB hydrochlorothiazide  without adverse effects.  Check renal parameters today.  Continue with weight loss therapy. Losing 10% may improve blood pressure control. Monitor for symptoms of orthostasis while losing weight. Continue current regimen and home monitoring for a goal blood pressure of 120/80.

## 2023-09-19 NOTE — Assessment & Plan Note (Signed)
 Check vitamin D levels

## 2023-09-19 NOTE — Progress Notes (Deleted)
 [address] [place],  [place], [place] [address]  Office: [phone]  /  Fax: [phone]   Subjective   Initial Visit  Ashley York (MR# [id number]) is a 68 y.o. female who presents for evaluation and treatment of obesity and related comorbidities. Current BMI is Body mass index is 43.1 kg/m. Ashley York has been struggling with her weight for many years and has been unsuccessful in either losing weight, maintaining weight loss, or reaching her healthy weight goal.  Ashley York is currently in the action stage of change and ready to dedicate time achieving and maintaining a healthier weight. Ashley York is interested in becoming our patient and working on intensive lifestyle modifications including (but not limited to) diet and exercise for weight loss.  Weight history:  When asked how their weight has affected their life and health, she states: {EMWeightAffected:28305}  When asked what else they would like to accomplish? She states: {EMHopetoaccomplish:28304::Adopt a healthier eating pattern and lifestyle,Improve energy levels and physical activity,Improve existing medical conditions,Improve quality of life}  She starting to note weight gain during : {emstartedtogainweight:31588}.  Life events associated with weight gain include : {emlifeeventsweighgain:31589}.   Other contributing factors: {EMcontributingfactors:28307}.  Their highest weight has been:  *** lbs.  Desired weight: ***  Previous weight-loss programs : {emweightlossprograms:31590::None}.  Their maximum weight loss was:  *** lbs.  Their greatest challenge with dieting: {emgreatestchallengediet:31593}.  Current or previous pharmacotherapy: {EM previousRx:28311}.  Response to medication: {EMResponsetomedication:28312}   Nutritional History:  Current nutrition plan: {EMNutritionplan:28309::None}.  How many times do you eat outside the home: {emfrequency:31645}  How often do they skip meals:  {emskipmeals:31594}  What beverages do they drink: {embeverages:31595}.   Use of artificial sweetners : {Yes/No:30480221}  Food intolerances or dislikes: {emfoodintolerance:31596::none}.  Food triggers: {emfoodtriggers:31600::None}.  Food cravings: {emfoodcravings:31601}  Do they struggle with excessive hunger or portion control : {YES/NO:21197}   Physical Activity:  Current level of physical activity: {EMcurrentPA:28310::None}  Barriers to Exercise: {embarrierstoexercise:32606}   Past medical history includes:   Past Medical History:  Diagnosis Date   Allergy    Back pain    CHOLELITHIASIS 05/10/2008   Qualifier: Diagnosis of  By: Tivis RN, Channing     CONSTIPATION 04/23/2008   Qualifier: Diagnosis of  By: Abran MD, Norleen SAILOR    Dyspnea on exertion    Gallstones    GERD (gastroesophageal reflux disease)    Heart murmur    HEMORRHOIDS, INTERNAL 04/19/2008   Qualifier: Diagnosis of  By: Claudene GUSS Railing     Hyperlipidemia    Hypertension    HYPERTENSION 04/19/2008   Qualifier: Diagnosis of  By: Claudene GUSS Railing     Hypothyroidism    HYPOTHYROIDISM 04/19/2008   Qualifier: Diagnosis of  By: Claudene GUSS Railing     Joint pain    Osteoarthritis    Overweight    Pre-diabetes    Sleep apnea    cpap   SOB (shortness of breath)    Spondylosis    Vitamin B 12 deficiency    Vitamin D deficiency      Objective   BP 136/82   Pulse 68   Temp 98 F (36.7 C)   Ht 5' 5.5 (1.664 m)   Wt 263 lb (119.3 kg)   SpO2 99%   BMI 43.10 kg/m  She was weighed on the bioimpedance scale: Body mass index is 43.1 kg/m.    Anthropometrics:  Vitals Temp: 98 F (36.7 C) BP: 136/82 Pulse Rate: 68 SpO2: 99 %   Anthropometric Measurements  Height: 5' 5.5 (1.664 m) Weight: 263 lb (119.3 kg) BMI (Calculated): 43.08 Starting Weight: 263 lb Waist Measurement : 50 inches   Body Composition  Body Fat %: 54.4 % Fat Mass (lbs): 143 lbs Muscle Mass (lbs): 114  lbs Visceral Fat Rating : 19   Other Clinical Data Fasting: yes Labs: yes Today's Visit #: 1 Starting Date: 09/19/23    Physical Exam:  General: She is overweight, cooperative, alert, well developed, and in no acute distress. PSYCH: Has normal mood, affect and thought process.   HEENT: EOMI, sclerae are anicteric. Lungs: Normal breathing effort, no conversational dyspnea. Extremities: No edema.  Neurologic: No gross sensory or motor deficits. No tremors or fasciculations noted.    Diagnostic Data Reviewed  EKG: Normal sinus rhythm, rate 66 bpm. No conduction abnormalities, abnormal Q waves or chamber enlargement.  Indirect Calorimeter completed today shows a VO2 of 237 and a REE of 1627.  Her calculated basal metabolic rate is 8255 thus her resting energy expenditure slower than calculated.  Depression Screen  Ashley York's PHQ-9 score was: 1.     09/19/2023    8:06 AM  Depression screen PHQ 2/9  Decreased Interest 0  Down, Depressed, Hopeless 0  PHQ - 2 Score 0  Altered sleeping 0  Tired, decreased energy 1  Change in appetite 0  Feeling bad or failure about yourself  0  Trouble concentrating 0  Moving slowly or fidgety/restless 0  Suicidal thoughts 0  PHQ-9 Score 1  Difficult doing work/chores Not difficult at all    Screening for Sleep Related Breathing Disorders  Ashley York admits to daytime somnolence and admits to waking up still tired. Patient does not have a history of symptoms of OSA. Ashley York generally gets 5 or 6 hours of sleep per night, and states that she has generally restful sleep. Snoring is present. Apneic episodes are not present. Epworth Sleepiness Score is 17.   BMET    Component Value Date/Time   NA 141 12/02/2020 0304   K 3.7 12/02/2020 0304   CL 106 12/02/2020 0304   CO2 26 12/02/2020 0304   GLUCOSE 158 (H) 12/02/2020 0304   BUN 19 12/02/2020 0304   CREATININE 0.70 12/02/2020 0304   CALCIUM  9.0 12/02/2020 0304   GFRNONAA >60 12/02/2020  0304   GFRAA  10/24/2008 1246    >60        The eGFR has been calculated using the MDRD equation. This calculation has not been validated in all clinical situations. eGFR's persistently <60 mL/min signify possible Chronic Kidney Disease.   Lab Results  Component Value Date   HGBA1C 6.0 (H) 11/19/2020   No results found for: INSULIN CBC    Component Value Date/Time   WBC 12.1 (H) 12/02/2020 0304   RBC 3.72 (L) 12/02/2020 0304   HGB 9.7 (L) 12/02/2020 0304   HCT 31.0 (L) 12/02/2020 0304   PLT 211 12/02/2020 0304   MCV 83.3 12/02/2020 0304   MCH 26.1 12/02/2020 0304   MCHC 31.3 12/02/2020 0304   RDW 15.3 12/02/2020 0304   Iron/TIBC/Ferritin/ %Sat No results found for: IRON, TIBC, FERRITIN, IRONPCTSAT Lipid Panel  No results found for: CHOL, TRIG, HDL, CHOLHDL, VLDL, LDLCALC, LDLDIRECT Hepatic Function Panel     Component Value Date/Time   PROT 7.2 01/12/2023 1501   ALBUMIN 4.1 01/12/2023 1501   AST 17 01/12/2023 1501   ALT 9 01/12/2023 1501   ALKPHOS 94 01/12/2023 1501   BILITOT 0.3 01/12/2023 1501   BILIDIR <0.10 01/12/2023  1501   No results found for: TSH   Assessment and Plan   TREATMENT PLAN FOR OBESITY:  Recommended Dietary Goals  Ashley York is currently in the action stage of change. As such, her goal is to implement medically supervised obesity management plan.  She has agreed to implement: {emwtlossplannewint:31639}  Behavioral Intervention  We discussed the following Behavioral Modification Strategies today: {EMwtlossstrategiesnewint:31640::increasing lean protein intake to established goals,decreasing simple carbohydrates ,increasing vegetables,increasing lower glycemic fruits,increasing fiber rich foods,avoiding skipping meals,increasing water  intake,work on meal planning and preparation,reading food labels ,keeping healthy foods at home,identifying sources and decreasing liquid calories,decreasing  eating out or consumption of processed foods, and making healthy choices when eating convenient foods,planning for success,better snacking choices}  Additional resources provided today: {emadditionalresourcesnewint:32116::None,Handout on healthy eating and balanced plate,Handout on complex carbohydrates and lean sources of protein}  Recommended Physical Activity Goals  Ashley York has been advised to work up to 150 minutes of moderate intensity aerobic activity a week and strengthening exercises 2-3 times per week for cardiovascular health, weight loss maintenance and preservation of muscle mass.   She has agreed to :  {EMEXERCISE:28847::Think about enjoyable ways to increase daily physical activity and overcoming barriers to exercise,Increase physical activity in their day and reduce sedentary time (increase NEAT).}  Medical Interventions and Pharmacotherapy We will work on building a Therapist, art and behavioral strategies. We will discuss the role of pharmacotherapy as an adjunct at subsequent visits.   ASSOCIATED CONDITIONS ADDRESSED TODAY  Other Fatigue ***. Ashley York does feel that her weight is causing her energy to be lower than it should be. Fatigue may be related to obesity, depression or many other causes. Labs will be ordered, and in the meanwhile, Ashley York will focus on self care including making healthy food choices, increasing physical activity and focusing on stress reduction.  Shortness of Breath Ashley York notes increasing shortness of breath with physical activity and seems to be worsening over time with weight gain. She notes getting out of breath sooner with activity than she used to. This has not gotten worse recently. Ashley York denies shortness of breath at rest or orthopnea.  Other fatigue -     EKG 12-Lead  SOB (shortness of breath) on exertion  Depression screen  Essential hypertension  Prediabetes    Follow-up  She was  informed of the importance of frequent follow-up visits to maximize her success with intensive lifestyle modifications for her multiple health conditions. She was informed we would discuss her lab results at her next visit unless there is a critical issue that needs to be addressed sooner. Ashley York agreed to keep her next visit at the agreed upon time to discuss these results.  Attestation Statement  This is the patient's intake visit at [facility]. The patient's Health Questionnaire was reviewed at length. Included in the packet: current and past health history, medications, allergies, ROS, gynecologic history (women only), surgical history, family history, social history, weight history, weight loss surgery history (for those that have had weight loss surgery), nutritional evaluation, mood and food questionnaire, PHQ9, Epworth questionnaire, sleep habits questionnaire, patient life and health improvement goals questionnaire. These will all be scanned into the patient's chart under media.   During the visit, I independently reviewed the patient's EKG***, previous labs, bioimpedance scale results, and indirect calorimetry results. I used this information to medically tailor a meal plan for the patient that will help her to lose weight and will improve her obesity-related conditions. I performed a medically necessary appropriate examination and/or  evaluation. I discussed the assessment and treatment plan with the patient. The patient was provided an opportunity to ask questions and all were answered. The patient agreed with the plan and demonstrated an understanding of the instructions. Labs were ordered at this visit and will be reviewed at the next visit unless critical results need to be addressed immediately. Clinical information was updated and documented in the EMR.   In addition, they received basic education on identification of processed foods and reduction of these, different  sources of lean proteins and complex carbohydrates and how to eat balanced by incorporation of whole foods.  Reviewed by clinician on day of visit: allergies, medications, problem list, medical history, surgical history, family history, social history, and previous encounter notes.  I have spent *** minutes in the care of the patient today including: {NUMBER 1-10:22536} minutes before the visit reviewing and preparing the chart. *** minutes face-to-face {emfacetoface:32598::assessing and reviewing listed medical problems as outlined in obesity care plan,providing nutritional and behavioral counseling on topics outlined in the obesity care plan,independently interpreting test results and goals of care, as described in assessment and plan,reviewing and discussing biometric information and progress} {NUMBER 1-10:22536} minutes after the visit updating chart and documentation of encounter.       Lucas Parker, MD

## 2023-09-19 NOTE — Assessment & Plan Note (Signed)
-  As above.

## 2023-09-19 NOTE — Assessment & Plan Note (Signed)
 Check TSH

## 2023-09-19 NOTE — Assessment & Plan Note (Signed)
 Reviewed sleep study in the past for sleep apnea was mild for daytime somnolence is out of proportion.  Suspect she may have either narcolepsy or other causes of daytime fatigue and somnolence.  This may be related to insulin resistance as well.  We will check serologies, please refer to orders.  May benefit from a follow-up sleep study to determine if current treatment is effective.  Losing 15% of body weight may reduce AHI

## 2023-09-19 NOTE — Assessment & Plan Note (Signed)
 Most recent A1c is  Lab Results  Component Value Date   HGBA1C 6.0 (H) 11/19/2020    Patient aware of disease state and risk of progression. This may contribute to abnormal cravings, fatigue and diabetic complications without having diabetes.   We have discussed treatment options which include: losing 7 to 10% of body weight, increasing physical activity to a goal of 150 minutes a week at moderate intensity.  Advised to maintain a diet low on simple and processed carbohydrates.  Educated on the carb insulin model for obesity  Patient has been taking someone else's Ozempic, person has diabetes.  We discussed that this is not recommended and could increase her risk of GLP-1 related adverse effects

## 2023-09-20 LAB — COMPREHENSIVE METABOLIC PANEL WITH GFR
ALT: 19 IU/L (ref 0–32)
AST: 24 IU/L (ref 0–40)
Albumin: 4.3 g/dL (ref 3.9–4.9)
Alkaline Phosphatase: 112 IU/L (ref 44–121)
BUN/Creatinine Ratio: 22 (ref 12–28)
BUN: 19 mg/dL (ref 8–27)
Bilirubin Total: 0.3 mg/dL (ref 0.0–1.2)
CO2: 24 mmol/L (ref 20–29)
Calcium: 9.6 mg/dL (ref 8.7–10.3)
Chloride: 103 mmol/L (ref 96–106)
Creatinine, Ser: 0.86 mg/dL (ref 0.57–1.00)
Globulin, Total: 3 g/dL (ref 1.5–4.5)
Glucose: 93 mg/dL (ref 70–99)
Potassium: 4.1 mmol/L (ref 3.5–5.2)
Sodium: 142 mmol/L (ref 134–144)
Total Protein: 7.3 g/dL (ref 6.0–8.5)
eGFR: 74 mL/min/{1.73_m2} (ref >59)

## 2023-09-20 LAB — TSH: TSH: 2.28 u[IU]/mL (ref 0.450–4.500)

## 2023-09-20 LAB — CBC WITH DIFFERENTIAL/PLATELET
Basophils Absolute: 0.1 10*3/uL (ref 0.0–0.2)
Basos: 1 %
EOS (ABSOLUTE): 0.6 10*3/uL — ABNORMAL HIGH (ref 0.0–0.4)
Eos: 9 %
Hematocrit: 39.4 % (ref 34.0–46.6)
Hemoglobin: 11.8 g/dL (ref 11.1–15.9)
Immature Grans (Abs): 0 10*3/uL (ref 0.0–0.1)
Immature Granulocytes: 0 %
Lymphocytes Absolute: 2.2 10*3/uL (ref 0.7–3.1)
Lymphs: 34 %
MCH: 25.8 pg — ABNORMAL LOW (ref 26.6–33.0)
MCHC: 29.9 g/dL — ABNORMAL LOW (ref 31.5–35.7)
MCV: 86 fL (ref 79–97)
Monocytes Absolute: 0.7 10*3/uL (ref 0.1–0.9)
Monocytes: 10 %
Neutrophils Absolute: 3.1 10*3/uL (ref 1.4–7.0)
Neutrophils: 46 %
Platelets: 239 10*3/uL (ref 150–450)
RBC: 4.58 x10E6/uL (ref 3.77–5.28)
RDW: 13.5 % (ref 11.7–15.4)
WBC: 6.6 10*3/uL (ref 3.4–10.8)

## 2023-09-20 LAB — IRON,TIBC AND FERRITIN PANEL
Ferritin: 45 ng/mL (ref 15–150)
Iron Saturation: 15 % (ref 15–55)
Iron: 48 ug/dL (ref 27–139)
Total Iron Binding Capacity: 329 ug/dL (ref 250–450)
UIBC: 281 ug/dL (ref 118–369)

## 2023-09-20 LAB — VITAMIN B12: Vitamin B-12: 1046 pg/mL (ref 232–1245)

## 2023-09-20 LAB — LIPID PANEL WITH LDL/HDL RATIO
Cholesterol, Total: 169 mg/dL (ref 100–199)
HDL: 71 mg/dL (ref >39)
LDL Chol Calc (NIH): 83 mg/dL (ref 0–99)
LDL/HDL Ratio: 1.2 ratio (ref 0.0–3.2)
Triglycerides: 83 mg/dL (ref 0–149)
VLDL Cholesterol Cal: 15 mg/dL (ref 5–40)

## 2023-09-20 LAB — INSULIN, RANDOM: INSULIN: 9.7 u[IU]/mL (ref 2.6–24.9)

## 2023-09-20 LAB — HEMOGLOBIN A1C
Est. average glucose Bld gHb Est-mCnc: 131 mg/dL
Hgb A1c MFr Bld: 6.2 % — ABNORMAL HIGH (ref 4.8–5.6)

## 2023-09-20 LAB — VITAMIN D 25 HYDROXY (VIT D DEFICIENCY, FRACTURES): Vit D, 25-Hydroxy: 54.7 ng/mL (ref 30.0–100.0)

## 2023-09-21 ENCOUNTER — Ambulatory Visit

## 2023-09-21 ENCOUNTER — Encounter: Payer: Self-pay | Admitting: Physical Therapy

## 2023-09-21 ENCOUNTER — Ambulatory Visit: Attending: Orthopedic Surgery | Admitting: Physical Therapy

## 2023-09-21 DIAGNOSIS — R2689 Other abnormalities of gait and mobility: Secondary | ICD-10-CM | POA: Insufficient documentation

## 2023-09-21 DIAGNOSIS — M6281 Muscle weakness (generalized): Secondary | ICD-10-CM | POA: Insufficient documentation

## 2023-09-21 DIAGNOSIS — M5459 Other low back pain: Secondary | ICD-10-CM | POA: Diagnosis not present

## 2023-09-21 NOTE — Therapy (Signed)
 OUTPATIENT PHYSICAL THERAPY TREATMENT   Patient Name: Ashley York MRN: [id number] DOB:12/13/1955, 68 y.o., female Today's Date: 09/21/2023  END OF SESSION:  PT End of Session - 09/21/23 1403     Visit Number 3    Number of Visits 17    Date for PT Re-Evaluation 11/03/23    Authorization Type [employer] MCR    PT Start Time 0200    PT Stop Time 0242    PT Time Calculation (min) 42 min           Past Medical History:  Diagnosis Date   Allergy    Back pain    CHOLELITHIASIS 05/10/2008   Qualifier: Diagnosis of  By: Tivis RN, Channing     CONSTIPATION 04/23/2008   Qualifier: Diagnosis of  By: Abran MD, Norleen SAILOR    Dyspnea on exertion    Gallstones    GERD (gastroesophageal reflux disease)    Heart murmur    HEMORRHOIDS, INTERNAL 04/19/2008   Qualifier: Diagnosis of  By: Claudene GUSS Railing     Hyperlipidemia    Hypertension    HYPERTENSION 04/19/2008   Qualifier: Diagnosis of  By: Claudene GUSS Railing     Hypothyroidism    HYPOTHYROIDISM 04/19/2008   Qualifier: Diagnosis of  By: Claudene GUSS Railing     Joint pain    Osteoarthritis    Overweight    Pre-diabetes    Sleep apnea    cpap   SOB (shortness of breath)    Spondylosis    Vitamin B 12 deficiency    Vitamin D deficiency    Past Surgical History:  Procedure Laterality Date   CESAREAN SECTION  1983, 1984   COLONOSCOPY     KNEE SURGERY Right 03/22/2008   TOTAL KNEE ARTHROPLASTY Left 12/01/2020   Procedure: TOTAL KNEE ARTHROPLASTY;  Surgeon: Melodi Lerner, MD;  Location: [facility];  Service: Orthopedics;  Laterality: Left;    WISDOM TOOTH EXTRACTION  03/22/1997   Patient Active Problem List   Diagnosis Date Noted   Prediabetes 09/19/2023   OSA (obstructive sleep apnea) - mild on CPAP 09/19/2023   Pure hypercholesterolemia 09/19/2023   Anemia 09/19/2023   Daytime somnolence 09/19/2023   Vitamin D deficiency 09/19/2023   OA (osteoarthritis) of knee 12/01/2020   Dyspnea on exertion 11/17/2017    CHOLELITHIASIS 05/10/2008   CONSTIPATION 04/23/2008   Hypothyroidism 04/19/2008   Essential hypertension 04/19/2008   HEMORRHOIDS, INTERNAL 04/19/2008    PCP: Rexanne Ingle, MD  REFERRING PROVIDER: Burnetta Aures, MD  REFERRING DIAG: M54.50 (ICD-10-CM) - Low back pain, unspecified   Rationale for Evaluation and Treatment: Rehabilitation  THERAPY DIAG:  Other low back pain  Muscle weakness (generalized)  Other abnormalities of gait and mobility  ONSET DATE: Chronic  SUBJECTIVE:  SUBJECTIVE STATEMENT: PT reports 2/10 pain and compliance with HEP. Doing hip exercises in the water .    Pt presents to PT with reports of decreased LBP today at 2x10. Has been compliant with initial HEP with no adverse effect.   EVAL: Pt presents to PT with reports of chronic lower back pain with referral into LE R>L. Notes pain increases with prolonged standing and walking. Some N/T in bilateral LE also R>L. Does aquatic exercise and notes this seem sto help her discomfort some too. Denies bowel/bladder changes or saddle anesthesia.   PERTINENT HISTORY:  HTN: also bilat knee replacements   PAIN:  Are you having pain?  Yes: NPRS scale: 2/10 Worst: 7/10 Pain location: lower back,R LE Pain description: Tired Aggravating factors: standing, walking Relieving factors: rest, medication  PRECAUTIONS: Spondylolisthesis   RED FLAGS: None   WEIGHT BEARING RESTRICTIONS: No  FALLS:  Has patient fallen in last 6 months? No  LIVING ENVIRONMENT: Lives with: lives alone Lives in: House/apartment Stairs: No Has following equipment at home: Single point cane  OCCUPATION: Retired  PLOF: Independent  PATIENT GOALS: Pt wants to decrease back pain and improve comfort with standing and walking  OBJECTIVE:  Note:  Objective measures were completed at Evaluation unless otherwise noted.  DIAGNOSTIC FINDINGS:  See imaging   PATIENT SURVEYS:  ODI: 18/50 - 36% disability  COGNITION: Overall cognitive status: Within functional limits for tasks assessed     SENSATION: [facility]  MUSCLE LENGTH: Thomas test: Right (+); Left (+)  POSTURE: rounded shoulders, forward head, and increased lumbar lordosis  PALPATION: TTP to lumbar paraspinals and R gluteals   LUMBAR ROM:   AROM eval  Flexion 50%  Extension 25%  Right lateral flexion   Left lateral flexion   Right rotation   Left rotation    (Blank rows = not tested)  LOWER EXTREMITY MMT:    MMT Right eval Left eval  Hip flexion 3+ 3+  Hip extension    Hip abduction 3+ 3+  Hip adduction    Hip internal rotation    Hip external rotation    Knee flexion 3+ 3+  Knee extension 3+ 3+  Ankle dorsiflexion    Ankle plantarflexion    Ankle inversion    Ankle eversion     (Blank rows = not tested)  LUMBAR SPECIAL TESTS:  Straight leg raise test: Negative and Slump test: Negative  FUNCTIONAL TESTS:  30 Second Sit to Stand: 12 reps with UE  GAIT: Distance walked: 60ft Assistive device utilized: None Level of assistance: Complete Independence Comments: flexed trunk  TREATMENT: [facility] Adult PT Treatment:                                                DATE: 09/21/23 Nustep level 5 UE/LE x 5 minutes  Standing hip abd/ext 2x10- added YTB at shins Repeated lumbar flex with swiss ball in sitting 2x10  Repeated lateral flexion with swiss x 10 ea Supine PPT with march 2x10, 1 x 10 wth GTB Supine GTB clam with PPT 10 x 2  Supine mini bridge with feet on ball x 8 Hip flexor stretch bilateral x 3  PPT with ball squeeze x 10    [facility] Adult PT Treatment:  DATE: 09/12/2023 NuStep lvl 5 UE/LE x 4 min while taking subjective Supine PPT x 10 - 5 hold Supine PPT with ball 2x10 - 3 Supine SLR 2x10  Supine  PPT with march 2x10 Repeated lumbar flex with swiss ball in sitting 2x10  Repeated lateral flexion with swiss x 10 ea Modified thomas stretch x 60 ea Standing hip abd/ext 2x10  [facility] Adult PT Treatment:                                                DATE: 09/08/2023 Therapeutic Exercise: Modified thomas stretch x 30 R Supine PPT x 5 - 5 hold Supine SLR x 5 ea LTR x 5  Hooklying clamshell x 5 GTB  PATIENT EDUCATION:  Education details: eval findings, ODI, HEP, POC Person educated: Patient Education method: Explanation, Demonstration, and Handouts Education comprehension: verbalized understanding and returned demonstration  HOME EXERCISE PROGRAM: Access Code: [email or web address] URL: [email or web address] Date: 09/12/2023 Prepared by: Alm Kingdom  Exercises - Modified Debby Stretch  - 1 x daily - 7 x weekly - 2 reps - 30 sec hold - Supine Posterior Pelvic Tilt  - 1 x daily - 7 x weekly - 2 sets - 10 reps - 5 sec hold - Active Straight Leg Raise with Quad Set  - 1 x daily - 7 x weekly - 2 sets - 10 reps - Supine Lower Trunk Rotation  - 1 x daily - 7 x weekly - 10 reps - Hooklying Clamshell with Resistance  - 1 x daily - 7 x weekly - 2-3 sets - 10 reps - green band hold - Standing Hip Abduction with Counter Support  - 1 x daily - 7 x weekly - 2 sets - 10 reps - Standing Hip Extension with Counter Support  - 1 x daily - 7 x weekly - 2 sets - 10 reps  ASSESSMENT:  CLINICAL IMPRESSION: Pt was able to complete all prescribed exercises with no adverse effect. Exercises today focused on progression of core and proximal hip strength as well as improving lumbar mobility. She is limited by right leg cramping. Is eager to start aquatics and learn aquatic HEP.   Continues to benefit from skilled PT, will continue to progress as able.   EVAL: Patient is a 68 y.o. F who was seen today for physical therapy evaluation and treatment for chronic LBP with referral into bilateral LE R>L.  Physical findings are consistent with MD impression as pt demonstrates decrease in core and proximal hip strength and functional mobility. ODI score shows moderate disability in performance of home ADLs and community activities. Pt would benefit from skilled PT services working on improving core and proximal hip strength in order to decrease pain in gym and aquatic environments.   OBJECTIVE IMPAIRMENTS: Abnormal gait, decreased activity tolerance, decreased mobility, difficulty walking, decreased ROM, decreased strength, postural dysfunction, and pain   ACTIVITY LIMITATIONS: carrying, lifting, standing, squatting, stairs, transfers, and locomotion level  PARTICIPATION LIMITATIONS: meal prep, cleaning, driving, shopping, community activity, occupation, and yard work  PERSONAL FACTORS: Time since onset of injury/illness/exacerbation and 1-2 comorbidities: HTN are also affecting patient's functional outcome.   REHAB POTENTIAL: Good  CLINICAL DECISION MAKING: Evolving/moderate complexity  EVALUATION COMPLEXITY: Moderate   GOALS: Goals reviewed with patient? No  SHORT TERM GOALS: Target date: 09/29/2023   Pt will be compliant and knowledgeable with initial  HEP for improved comfort and carryover Baseline: initial HEP given  Goal status: INITIAL  2.  Pt will self report low back pain no greater than 5/10 for improved comfort and functional ability Baseline: 7/10 at worst Goal status: INITIAL   LONG TERM GOALS: Target date: 11/03/2023   Pt will be decrease ODI disability score to no greater than 25% (12/50) as proxy for functional improvement Baseline: 36% disability - 18/50  Goal status: INITIAL  2.  Pt will self report low back pain no greater than 3/10 for improved comfort and functional ability Baseline: 7/10 at worst Goal status: INITIAL   3.  Pt will increase 30 Second Sit to Stand rep count to no less than 14 reps for improved balance, strength, and functional mobility Baseline:  12 reps with UE Goal status: INITIAL   4.  Pt will improve standing and walking activity tolerance to no less than 30-45 minutes for improved comfort and functional ability with community activities  Baseline: 15 minutes Goal status: INITIAL   PLAN:  PT FREQUENCY: 2x/week  PT DURATION: 8 weeks  PLANNED INTERVENTIONS: 97164- PT Re-evaluation, 97110-Therapeutic exercises, 97530- Therapeutic activity, [id number]- Neuromuscular re-education, 97535- Self Care, [id number]- Manual therapy, [id number]- Gait training, [phone]- Aquatic Therapy, [phone]- Electrical stimulation (unattended), [id number]- Electrical stimulation (manual), 20560 (1-2 muscles), 20561 (3+ muscles)- Dry Needling, Cryotherapy, and Moist heat  PLAN FOR NEXT SESSION: assess HEP response, neutral spine core strengthening, hip flexor stretching   Date of referral: 08/24/2023 Referring provider: Burnetta Aures, MD Referring diagnosis? M54.50 (ICD-10-CM) - Low back pain, unspecified  Treatment diagnosis? (if different than referring diagnosis) Other low back pain Muscle weakness (generalized) Other abnormalities of gait and mobility  What was this (referring dx) caused by? Arthritis  Nature of Condition: Chronic (continuous duration > 3 months)   Laterality: Both  Current Functional Measure Score:  ODI: 18/50 - 36% disability  Objective measurements identify impairments when they are compared to normal values, the uninvolved extremity, and prior level of function.  [x]  Yes  []  No  Objective assessment of functional ability: Moderate functional limitations   Briefly describe symptoms:  Pt presents to PT with reports of chronic lower back pain with referral into LE R>L. Notes pain increases with prolonged standing and walking. Some N/T in bilateral LE also R>L. Does aquatic exercise and notes this seem sto help her discomfort some too. Denies bowel/bladder changes or saddle anesthesia.   How did symptoms start: Chronic onset with none trauma,  slowly increasing  Average pain intensity:  Last 24 hours: 2/10  Past week: 7/10  How often does the pt experience symptoms? Frequently  How much have the symptoms interfered with usual daily activities? Moderately  How has condition changed since care began at this facility? NA - initial visit  In general, how is the patients overall health? Very Good   BACK PAIN (STarT Back Screening Tool) No   Harlene Persons, PTA 09/21/23 2:40 PM Phone: [phone] Fax: [phone]

## 2023-09-26 ENCOUNTER — Ambulatory Visit

## 2023-09-26 DIAGNOSIS — M5459 Other low back pain: Secondary | ICD-10-CM

## 2023-09-26 DIAGNOSIS — M6281 Muscle weakness (generalized): Secondary | ICD-10-CM

## 2023-09-26 DIAGNOSIS — R2689 Other abnormalities of gait and mobility: Secondary | ICD-10-CM | POA: Diagnosis not present

## 2023-09-26 NOTE — Therapy (Signed)
 OUTPATIENT PHYSICAL THERAPY TREATMENT   Patient Name: Ashley York MRN: [id number] DOB:06-12-1955, 68 y.o., female Today's Date: 09/26/2023  END OF SESSION:  PT End of Session - 09/26/23 1401     Visit Number 4    Number of Visits 17    Date for PT Re-Evaluation 11/03/23    Authorization Type [employer] MCR    PT Start Time 1401    PT Stop Time 1441    PT Time Calculation (min) 40 min            Past Medical History:  Diagnosis Date   Allergy    Back pain    CHOLELITHIASIS 05/10/2008   Qualifier: Diagnosis of  By: Tivis RN, Channing     CONSTIPATION 04/23/2008   Qualifier: Diagnosis of  By: Abran MD, Norleen SAILOR    Dyspnea on exertion    Gallstones    GERD (gastroesophageal reflux disease)    Heart murmur    HEMORRHOIDS, INTERNAL 04/19/2008   Qualifier: Diagnosis of  By: Claudene GUSS Railing     Hyperlipidemia    Hypertension    HYPERTENSION 04/19/2008   Qualifier: Diagnosis of  By: Claudene GUSS Railing     Hypothyroidism    HYPOTHYROIDISM 04/19/2008   Qualifier: Diagnosis of  By: Claudene GUSS Railing     Joint pain    Osteoarthritis    Overweight    Pre-diabetes    Sleep apnea    cpap   SOB (shortness of breath)    Spondylosis    Vitamin B 12 deficiency    Vitamin D  deficiency    Past Surgical History:  Procedure Laterality Date   CESAREAN SECTION  1983, 1984   COLONOSCOPY     KNEE SURGERY Right 03/22/2008   TOTAL KNEE ARTHROPLASTY Left 12/01/2020   Procedure: TOTAL KNEE ARTHROPLASTY;  Surgeon: Melodi Lerner, MD;  Location: [facility];  Service: Orthopedics;  Laterality: Left;    WISDOM TOOTH EXTRACTION  03/22/1997   Patient Active Problem List   Diagnosis Date Noted   Prediabetes 09/19/2023   OSA (obstructive sleep apnea) - mild on CPAP 09/19/2023   Pure hypercholesterolemia 09/19/2023   Anemia 09/19/2023   Daytime somnolence 09/19/2023   Vitamin D  deficiency 09/19/2023   OA (osteoarthritis) of knee 12/01/2020   Dyspnea on exertion 11/17/2017    CHOLELITHIASIS 05/10/2008   CONSTIPATION 04/23/2008   Hypothyroidism 04/19/2008   Essential hypertension 04/19/2008   HEMORRHOIDS, INTERNAL 04/19/2008    PCP: Rexanne Ingle, MD  REFERRING PROVIDER: Burnetta Aures, MD  REFERRING DIAG: M54.50 (ICD-10-CM) - Low back pain, unspecified   Rationale for Evaluation and Treatment: Rehabilitation  THERAPY DIAG:  Other low back pain  Muscle weakness (generalized)  Other abnormalities of gait and mobility  ONSET DATE: Chronic  SUBJECTIVE:  SUBJECTIVE STATEMENT: Pt presents to PT with no current reports of pain. Has been compliant with HEP.   EVAL: Pt presents to PT with reports of chronic lower back pain with referral into LE R>L. Notes pain increases with prolonged standing and walking. Some N/T in bilateral LE also R>L. Does aquatic exercise and notes this seem sto help her discomfort some too. Denies bowel/bladder changes or saddle anesthesia.   PERTINENT HISTORY:  HTN: also bilat knee replacements   PAIN:  Are you having pain?  Yes: NPRS scale: 2/10 Worst: 7/10 Pain location: lower back,R LE Pain description: Tired Aggravating factors: standing, walking Relieving factors: rest, medication  PRECAUTIONS: Spondylolisthesis   RED FLAGS: None   WEIGHT BEARING RESTRICTIONS: No  FALLS:  Has patient fallen in last 6 months? No  LIVING ENVIRONMENT: Lives with: lives alone Lives in: House/apartment Stairs: No Has following equipment at home: Single point cane  OCCUPATION: Retired  PLOF: Independent  PATIENT GOALS: Pt wants to decrease back pain and improve comfort with standing and walking  OBJECTIVE:  Note: Objective measures were completed at Evaluation unless otherwise noted.  DIAGNOSTIC FINDINGS:  See imaging   PATIENT SURVEYS:   ODI: 18/50 - 36% disability  COGNITION: Overall cognitive status: Within functional limits for tasks assessed     SENSATION: [facility]  MUSCLE LENGTH: Thomas test: Right (+); Left (+)  POSTURE: rounded shoulders, forward head, and increased lumbar lordosis  PALPATION: TTP to lumbar paraspinals and R gluteals   LUMBAR ROM:   AROM eval  Flexion 50%  Extension 25%  Right lateral flexion   Left lateral flexion   Right rotation   Left rotation    (Blank rows = not tested)  LOWER EXTREMITY MMT:    MMT Right eval Left eval  Hip flexion 3+ 3+  Hip extension    Hip abduction 3+ 3+  Hip adduction    Hip internal rotation    Hip external rotation    Knee flexion 3+ 3+  Knee extension 3+ 3+  Ankle dorsiflexion    Ankle plantarflexion    Ankle inversion    Ankle eversion     (Blank rows = not tested)  LUMBAR SPECIAL TESTS:  Straight leg raise test: Negative and Slump test: Negative  FUNCTIONAL TESTS:  30 Second Sit to Stand: 12 reps with UE  GAIT: Distance walked: 31ft Assistive device utilized: None Level of assistance: Complete Independence Comments: flexed trunk  TREATMENT: [facility] Adult PT Treatment:                                                DATE: 09/26/23 Nustep level 5 UE/LE x 5 minutes for functional activity tolerance Supine PPT x 10 - 5 hold Supine PPT with ball 2x10 Pilates SLR 2x10 ea Repeated lumbar flex with swiss ball in sitting 2x10  Repeated lateral flexion with swiss x 10 ea Lateral walk YTB x 2 laps at counter Standing hip abd/ext x 10 YTB STS 2x10 - no UE   [facility] Adult PT Treatment:                                                DATE: 09/21/23 Nustep level 5 UE/LE x 5 minutes  Standing  hip abd/ext 2x10- added YTB at shins Repeated lumbar flex with swiss ball in sitting 2x10  Repeated lateral flexion with swiss x 10 ea Supine PPT with march 2x10, 1 x 10 wth GTB Supine GTB clam with PPT 10 x 2  Supine mini bridge with feet on ball x 8 Hip  flexor stretch bilateral x 3  PPT with ball squeeze x 10  [facility] Adult PT Treatment:                                                DATE: 09/12/2023 NuStep lvl 5 UE/LE x 4 min while taking subjective Supine PPT x 10 - 5 hold Supine PPT with ball 2x10 - 3 Supine SLR 2x10  Supine PPT with march 2x10 Repeated lumbar flex with swiss ball in sitting 2x10  Repeated lateral flexion with swiss x 10 ea Modified thomas stretch x 60 ea Standing hip abd/ext 2x10  [facility] Adult PT Treatment:                                                DATE: 09/08/2023 Therapeutic Exercise: Modified thomas stretch x 30 R Supine PPT x 5 - 5 hold Supine SLR x 5 ea LTR x 5  Hooklying clamshell x 5 GTB  PATIENT EDUCATION:  Education details: eval findings, ODI, HEP, POC Person educated: Patient Education method: Explanation, Demonstration, and Handouts Education comprehension: verbalized understanding and returned demonstration  HOME EXERCISE PROGRAM: Access Code: [email or web address] URL: [email or web address] Date: 09/12/2023 Prepared by: Alm Kingdom  Exercises - Modified Debby Stretch  - 1 x daily - 7 x weekly - 2 reps - 30 sec hold - Supine Posterior Pelvic Tilt  - 1 x daily - 7 x weekly - 2 sets - 10 reps - 5 sec hold - Active Straight Leg Raise with Quad Set  - 1 x daily - 7 x weekly - 2 sets - 10 reps - Supine Lower Trunk Rotation  - 1 x daily - 7 x weekly - 10 reps - Hooklying Clamshell with Resistance  - 1 x daily - 7 x weekly - 2-3 sets - 10 reps - green band hold - Standing Hip Abduction with Counter Support  - 1 x daily - 7 x weekly - 2 sets - 10 reps - Standing Hip Extension with Counter Support  - 1 x daily - 7 x weekly - 2 sets - 10 reps  ASSESSMENT:  CLINICAL IMPRESSION: Pt was able to complete all prescribed exercises with no adverse effect. Exercises today focused on improving core and proximal hip strength in order to decrease pain and improve functional mobility. Pt is progressing  with PT, will have her first aquatic appointment later this week. Will continue to progress in both gym and aquatic sessions as able.  EVAL: Patient is a 68 y.o. F who was seen today for physical therapy evaluation and treatment for chronic LBP with referral into bilateral LE R>L. Physical findings are consistent with MD impression as pt demonstrates decrease in core and proximal hip strength and functional mobility. ODI score shows moderate disability in performance of home ADLs and community activities. Pt would benefit from skilled PT services working on improving core and proximal hip strength in  order to decrease pain in gym and aquatic environments.   OBJECTIVE IMPAIRMENTS: Abnormal gait, decreased activity tolerance, decreased mobility, difficulty walking, decreased ROM, decreased strength, postural dysfunction, and pain   ACTIVITY LIMITATIONS: carrying, lifting, standing, squatting, stairs, transfers, and locomotion level  PARTICIPATION LIMITATIONS: meal prep, cleaning, driving, shopping, community activity, occupation, and yard work  PERSONAL FACTORS: Time since onset of injury/illness/exacerbation and 1-2 comorbidities: HTN are also affecting patient's functional outcome.   REHAB POTENTIAL: Good  CLINICAL DECISION MAKING: Evolving/moderate complexity  EVALUATION COMPLEXITY: Moderate   GOALS: Goals reviewed with patient? No  SHORT TERM GOALS: Target date: 09/29/2023   Pt will be compliant and knowledgeable with initial HEP for improved comfort and carryover Baseline: initial HEP given  Goal status: INITIAL  2.  Pt will self report low back pain no greater than 5/10 for improved comfort and functional ability Baseline: 7/10 at worst Goal status: INITIAL   LONG TERM GOALS: Target date: 11/03/2023   Pt will be decrease ODI disability score to no greater than 25% (12/50) as proxy for functional improvement Baseline: 36% disability - 18/50  Goal status: INITIAL  2.  Pt will  self report low back pain no greater than 3/10 for improved comfort and functional ability Baseline: 7/10 at worst Goal status: INITIAL   3.  Pt will increase 30 Second Sit to Stand rep count to no less than 14 reps for improved balance, strength, and functional mobility Baseline: 12 reps with UE Goal status: INITIAL   4.  Pt will improve standing and walking activity tolerance to no less than 30-45 minutes for improved comfort and functional ability with community activities  Baseline: 15 minutes Goal status: INITIAL   PLAN:  PT FREQUENCY: 2x/week  PT DURATION: 8 weeks  PLANNED INTERVENTIONS: 97164- PT Re-evaluation, 97110-Therapeutic exercises, 97530- Therapeutic activity, [id number]- Neuromuscular re-education, 97535- Self Care, 02859- Manual therapy, [id number]- Gait training, [phone]- Aquatic Therapy, [phone]- Electrical stimulation (unattended), [id number]- Electrical stimulation (manual), 20560 (1-2 muscles), 20561 (3+ muscles)- Dry Needling, Cryotherapy, and Moist heat  PLAN FOR NEXT SESSION: assess HEP response, neutral spine core strengthening, hip flexor stretching   Date of referral: 08/24/2023 Referring provider: Burnetta Aures, MD Referring diagnosis? M54.50 (ICD-10-CM) - Low back pain, unspecified  Treatment diagnosis? (if different than referring diagnosis) Other low back pain Muscle weakness (generalized) Other abnormalities of gait and mobility  What was this (referring dx) caused by? Arthritis  Nature of Condition: Chronic (continuous duration > 3 months)   Laterality: Both  Current Functional Measure Score:  ODI: 18/50 - 36% disability  Objective measurements identify impairments when they are compared to normal values, the uninvolved extremity, and prior level of function.  [x]  Yes  []  No  Objective assessment of functional ability: Moderate functional limitations   Briefly describe symptoms:  Pt presents to PT with reports of chronic lower back pain with referral into  LE R>L. Notes pain increases with prolonged standing and walking. Some N/T in bilateral LE also R>L. Does aquatic exercise and notes this seem sto help her discomfort some too. Denies bowel/bladder changes or saddle anesthesia.   How did symptoms start: Chronic onset with none trauma, slowly increasing  Average pain intensity:  Last 24 hours: 2/10  Past week: 7/10  How often does the pt experience symptoms? Frequently  How much have the symptoms interfered with usual daily activities? Moderately  How has condition changed since care began at this facility? NA - initial visit  In general, how is the patients overall  health? Very Good   BACK PAIN (STarT Back Screening Tool) No   Alm JAYSON Kingdom PT  09/26/23 2:50 PM

## 2023-09-29 ENCOUNTER — Encounter: Payer: Self-pay | Admitting: Physical Therapy

## 2023-09-29 ENCOUNTER — Ambulatory Visit: Admitting: Physical Therapy

## 2023-09-29 DIAGNOSIS — M5459 Other low back pain: Secondary | ICD-10-CM | POA: Diagnosis not present

## 2023-09-29 DIAGNOSIS — R2689 Other abnormalities of gait and mobility: Secondary | ICD-10-CM

## 2023-09-29 DIAGNOSIS — M6281 Muscle weakness (generalized): Secondary | ICD-10-CM

## 2023-09-29 NOTE — Therapy (Signed)
 OUTPATIENT PHYSICAL THERAPY TREATMENT   Patient Name: Ashley York MRN: [id number] DOB:1955/12/20, 68 y.o., female Today's Date: 09/30/2023  END OF SESSION:  PT End of Session - 09/29/23 1543     Visit Number 5    Number of Visits 17    Date for PT Re-Evaluation 11/03/23    Authorization Type [employer] MCR    PT Start Time 0330    PT Stop Time 0415    PT Time Calculation (min) 45 min            Past Medical History:  Diagnosis Date   Allergy    Back pain    CHOLELITHIASIS 05/10/2008   Qualifier: Diagnosis of  By: Tivis RN, Channing     CONSTIPATION 04/23/2008   Qualifier: Diagnosis of  By: Abran MD, Norleen SAILOR    Dyspnea on exertion    Gallstones    GERD (gastroesophageal reflux disease)    Heart murmur    HEMORRHOIDS, INTERNAL 04/19/2008   Qualifier: Diagnosis of  By: Claudene GUSS Railing     Hyperlipidemia    Hypertension    HYPERTENSION 04/19/2008   Qualifier: Diagnosis of  By: Claudene GUSS Railing     Hypothyroidism    HYPOTHYROIDISM 04/19/2008   Qualifier: Diagnosis of  By: Claudene GUSS Railing     Joint pain    Osteoarthritis    Overweight    Pre-diabetes    Sleep apnea    cpap   SOB (shortness of breath)    Spondylosis    Vitamin B 12 deficiency    Vitamin D  deficiency    Past Surgical History:  Procedure Laterality Date   CESAREAN SECTION  1983, 1984   COLONOSCOPY     KNEE SURGERY Right 03/22/2008   TOTAL KNEE ARTHROPLASTY Left 12/01/2020   Procedure: TOTAL KNEE ARTHROPLASTY;  Surgeon: Melodi Lerner, MD;  Location: [facility];  Service: Orthopedics;  Laterality: Left;    WISDOM TOOTH EXTRACTION  03/22/1997   Patient Active Problem List   Diagnosis Date Noted   Prediabetes 09/19/2023   OSA (obstructive sleep apnea) - mild on CPAP 09/19/2023   Pure hypercholesterolemia 09/19/2023   Anemia 09/19/2023   Daytime somnolence 09/19/2023   Vitamin D  deficiency 09/19/2023   OA (osteoarthritis) of knee 12/01/2020   Dyspnea on exertion 11/17/2017    CHOLELITHIASIS 05/10/2008   CONSTIPATION 04/23/2008   Hypothyroidism 04/19/2008   Essential hypertension 04/19/2008   HEMORRHOIDS, INTERNAL 04/19/2008    PCP: Rexanne Ingle, MD  REFERRING PROVIDER: Burnetta Aures, MD  REFERRING DIAG: M54.50 (ICD-10-CM) - Low back pain, unspecified   Rationale for Evaluation and Treatment: Rehabilitation  THERAPY DIAG:  Other low back pain  Muscle weakness (generalized)  Other abnormalities of gait and mobility  ONSET DATE: Chronic  SUBJECTIVE:  SUBJECTIVE STATEMENT: Pt presents to PT with no current reports of pain. Has been compliant with HEP.   EVAL: Pt presents to PT with reports of chronic lower back pain with referral into LE R>L. Notes pain increases with prolonged standing and walking. Some N/T in bilateral LE also R>L. Does aquatic exercise and notes this seem sto help her discomfort some too. Denies bowel/bladder changes or saddle anesthesia.   PERTINENT HISTORY:  HTN: also bilat knee replacements   PAIN:  Are you having pain?  Yes: NPRS scale: 2/10 Worst: 7/10 Pain location: lower back,R LE Pain description: Tired Aggravating factors: standing, walking Relieving factors: rest, medication  PRECAUTIONS: Spondylolisthesis   RED FLAGS: None   WEIGHT BEARING RESTRICTIONS: No  FALLS:  Has patient fallen in last 6 months? No  LIVING ENVIRONMENT: Lives with: lives alone Lives in: House/apartment Stairs: No Has following equipment at home: Single point cane  OCCUPATION: Retired  PLOF: Independent  PATIENT GOALS: Pt wants to decrease back pain and improve comfort with standing and walking  OBJECTIVE:  Note: Objective measures were completed at Evaluation unless otherwise noted.  DIAGNOSTIC FINDINGS:  See imaging   PATIENT SURVEYS:   ODI: 18/50 - 36% disability  COGNITION: Overall cognitive status: Within functional limits for tasks assessed     SENSATION: [facility]  MUSCLE LENGTH: Thomas test: Right (+); Left (+)  POSTURE: rounded shoulders, forward head, and increased lumbar lordosis  PALPATION: TTP to lumbar paraspinals and R gluteals   LUMBAR ROM:   AROM eval  Flexion 50%  Extension 25%  Right lateral flexion   Left lateral flexion   Right rotation   Left rotation    (Blank rows = not tested)  LOWER EXTREMITY MMT:    MMT Right eval Left eval  Hip flexion 3+ 3+  Hip extension    Hip abduction 3+ 3+  Hip adduction    Hip internal rotation    Hip external rotation    Knee flexion 3+ 3+  Knee extension 3+ 3+  Ankle dorsiflexion    Ankle plantarflexion    Ankle inversion    Ankle eversion     (Blank rows = not tested)  LUMBAR SPECIAL TESTS:  Straight leg raise test: Negative and Slump test: Negative  FUNCTIONAL TESTS:  30 Second Sit to Stand: 12 reps with UE  GAIT: Distance walked: 79ft Assistive device utilized: None Level of assistance: Complete Independence Comments: flexed trunk  TREATMENT:  TREATMENT 09/30/23:  Aquatic therapy at [facility]- [address] - therapeutic pool temp 92 degrees Pt enters building independently.  Treatment took place in water  3.8 to  4 ft 8 in.feet deep depending upon activity.  Pt entered and exited the pool via stair and handrails    Aquatic Therapy:  Water  walking for warm up fwd/lat/bkwds  L stretch Farmers carry Suitcase carry SL RDL with white barbell SL heel raise Plank on bench Plank on bench with alternating leg/arm lifts (hips flexed, neutral spine) Bil shoulder ext with walk back Lateral walking with band at toes Noodle stomp HS strech with noodle Water  bell balance ext/alt ext FT  Pt requires the buoyancy of water  for active assisted exercises with buoyancy supported for strengthening and AROM exercises. Hydrostatic  pressure also supports joints by unweighting joint load by at least 50 % in 3-4 feet depth water . 80% in chest to neck deep water . Water  will provide assistance with movement using the current and laminar flow while the buoyancy reduces weight bearing. Pt requires the  viscosity of the water  for resistance with strengthening exercises.     [facility] Adult PT Treatment:                                                DATE: 09/26/23 Nustep level 5 UE/LE x 5 minutes for functional activity tolerance Supine PPT x 10 - 5 hold Supine PPT with ball 2x10 Pilates SLR 2x10 ea Repeated lumbar flex with swiss ball in sitting 2x10  Repeated lateral flexion with swiss x 10 ea Lateral walk YTB x 2 laps at counter Standing hip abd/ext x 10 YTB STS 2x10 - no UE   [facility] Adult PT Treatment:                                                DATE: 09/21/23 Nustep level 5 UE/LE x 5 minutes  Standing hip abd/ext 2x10- added YTB at shins Repeated lumbar flex with swiss ball in sitting 2x10  Repeated lateral flexion with swiss x 10 ea Supine PPT with march 2x10, 1 x 10 wth GTB Supine GTB clam with PPT 10 x 2  Supine mini bridge with feet on ball x 8 Hip flexor stretch bilateral x 3  PPT with ball squeeze x 10  [facility] Adult PT Treatment:                                                DATE: 09/12/2023 NuStep lvl 5 UE/LE x 4 min while taking subjective Supine PPT x 10 - 5 hold Supine PPT with ball 2x10 - 3 Supine SLR 2x10  Supine PPT with march 2x10 Repeated lumbar flex with swiss ball in sitting 2x10  Repeated lateral flexion with swiss x 10 ea Modified thomas stretch x 60 ea Standing hip abd/ext 2x10  [facility] Adult PT Treatment:                                                DATE: 09/08/2023 Therapeutic Exercise: Modified thomas stretch x 30 R Supine PPT x 5 - 5 hold Supine SLR x 5 ea LTR x 5  Hooklying clamshell x 5 GTB  PATIENT EDUCATION:  Education details: eval findings, ODI, HEP, POC Person educated:  Patient Education method: Explanation, Demonstration, and Handouts Education comprehension: verbalized understanding and returned demonstration  HOME EXERCISE PROGRAM: Access Code: [email or web address] URL: [email or web address] Date: 09/12/2023 Prepared by: Alm Kingdom  Exercises - Modified Debby Stretch  - 1 x daily - 7 x weekly - 2 reps - 30 sec hold - Supine Posterior Pelvic Tilt  - 1 x daily - 7 x weekly - 2 sets - 10 reps - 5 sec hold - Active Straight Leg Raise with Quad Set  - 1 x daily - 7 x weekly - 2 sets - 10 reps - Supine Lower Trunk Rotation  - 1 x daily - 7 x weekly - 10 reps - Hooklying Clamshell with Resistance  - 1 x daily - 7 x  weekly - 2-3 sets - 10 reps - green band hold - Standing Hip Abduction with Counter Support  - 1 x daily - 7 x weekly - 2 sets - 10 reps - Standing Hip Extension with Counter Support  - 1 x daily - 7 x weekly - 2 sets - 10 reps  ASSESSMENT:  CLINICAL IMPRESSION: Session today focused on hip and core strengthening as well as balance in the aquatic environment for use of buoyancy to offload joints and the viscosity of water  as resistance during therapeutic exercise.  Fabienne is already participating in fairly high level aquatic exercise but we were able to introduce a few new exercises which she can do on her own.  She is most challenged by balance exercises including SL RDL and narrow base standing with perturbations.  Patient was able to tolerate all prescribed exercises in the aquatic environment with no adverse effects and reports 1/10 pain at the end of the session. Patient continues to benefit from skilled PT services on land and aquatic based and should be progressed as able to improve functional independence.   EVAL: Patient is a 68 y.o. F who was seen today for physical therapy evaluation and treatment for chronic LBP with referral into bilateral LE R>L. Physical findings are consistent with MD impression as pt demonstrates decrease in  core and proximal hip strength and functional mobility. ODI score shows moderate disability in performance of home ADLs and community activities. Pt would benefit from skilled PT services working on improving core and proximal hip strength in order to decrease pain in gym and aquatic environments.   OBJECTIVE IMPAIRMENTS: Abnormal gait, decreased activity tolerance, decreased mobility, difficulty walking, decreased ROM, decreased strength, postural dysfunction, and pain   ACTIVITY LIMITATIONS: carrying, lifting, standing, squatting, stairs, transfers, and locomotion level  PARTICIPATION LIMITATIONS: meal prep, cleaning, driving, shopping, community activity, occupation, and yard work  PERSONAL FACTORS: Time since onset of injury/illness/exacerbation and 1-2 comorbidities: HTN are also affecting patient's functional outcome.   REHAB POTENTIAL: Good  CLINICAL DECISION MAKING: Evolving/moderate complexity  EVALUATION COMPLEXITY: Moderate   GOALS: Goals reviewed with patient? No  SHORT TERM GOALS: Target date: 09/29/2023   Pt will be compliant and knowledgeable with initial HEP for improved comfort and carryover Baseline: initial HEP given  Goal status: INITIAL  2.  Pt will self report low back pain no greater than 5/10 for improved comfort and functional ability Baseline: 7/10 at worst Goal status: INITIAL   LONG TERM GOALS: Target date: 11/03/2023   Pt will be decrease ODI disability score to no greater than 25% (12/50) as proxy for functional improvement Baseline: 36% disability - 18/50  Goal status: INITIAL  2.  Pt will self report low back pain no greater than 3/10 for improved comfort and functional ability Baseline: 7/10 at worst Goal status: INITIAL   3.  Pt will increase 30 Second Sit to Stand rep count to no less than 14 reps for improved balance, strength, and functional mobility Baseline: 12 reps with UE Goal status: INITIAL   4.  Pt will improve standing and walking  activity tolerance to no less than 30-45 minutes for improved comfort and functional ability with community activities  Baseline: 15 minutes Goal status: INITIAL   PLAN:  PT FREQUENCY: 2x/week  PT DURATION: 8 weeks  PLANNED INTERVENTIONS: 97164- PT Re-evaluation, 97110-Therapeutic exercises, 97530- Therapeutic activity, [id number]- Neuromuscular re-education, 97535- Self Care, [id number]- Manual therapy, [id number]- Gait training, [phone]- Aquatic Therapy, [phone]- Electrical stimulation (unattended), [phone]- Electrical  stimulation (manual), 79439 (1-2 muscles), [id number] (3+ muscles)- Dry Needling, Cryotherapy, and Moist heat  PLAN FOR NEXT SESSION: assess HEP response, neutral spine core strengthening, hip flexor stretching   Date of referral: 08/24/2023 Referring provider: Burnetta Aures, MD Referring diagnosis? M54.50 (ICD-10-CM) - Low back pain, unspecified  Treatment diagnosis? (if different than referring diagnosis) Other low back pain Muscle weakness (generalized) Other abnormalities of gait and mobility  What was this (referring dx) caused by? Arthritis  Nature of Condition: Chronic (continuous duration > 3 months)   Laterality: Both  Current Functional Measure Score:  ODI: 18/50 - 36% disability  Objective measurements identify impairments when they are compared to normal values, the uninvolved extremity, and prior level of function.  [x]  Yes  []  No  Objective assessment of functional ability: Moderate functional limitations   Briefly describe symptoms:  Pt presents to PT with reports of chronic lower back pain with referral into LE R>L. Notes pain increases with prolonged standing and walking. Some N/T in bilateral LE also R>L. Does aquatic exercise and notes this seem sto help her discomfort some too. Denies bowel/bladder changes or saddle anesthesia.   How did symptoms start: Chronic onset with none trauma, slowly increasing  Average pain intensity:  Last 24 hours: 2/10  Past week:  7/10  How often does the pt experience symptoms? Frequently  How much have the symptoms interfered with usual daily activities? Moderately  How has condition changed since care began at this facility? NA - initial visit  In general, how is the patients overall health? Very Good   BACK PAIN (STarT Back Screening Tool) No   Helene BRAVO Shanik [place] PT  09/30/23 9:07 AM

## 2023-10-03 ENCOUNTER — Ambulatory Visit (INDEPENDENT_AMBULATORY_CARE_PROVIDER_SITE_OTHER): Admitting: Internal Medicine

## 2023-10-03 ENCOUNTER — Encounter (INDEPENDENT_AMBULATORY_CARE_PROVIDER_SITE_OTHER): Payer: Self-pay | Admitting: Internal Medicine

## 2023-10-03 ENCOUNTER — Ambulatory Visit

## 2023-10-03 VITALS — BP 136/89 | HR 74 | Temp 98.3°F | Ht 65.5 in | Wt 263.0 lb

## 2023-10-03 DIAGNOSIS — R2689 Other abnormalities of gait and mobility: Secondary | ICD-10-CM

## 2023-10-03 DIAGNOSIS — I10 Essential (primary) hypertension: Secondary | ICD-10-CM | POA: Diagnosis not present

## 2023-10-03 DIAGNOSIS — R7303 Prediabetes: Secondary | ICD-10-CM | POA: Diagnosis not present

## 2023-10-03 DIAGNOSIS — E7800 Pure hypercholesterolemia, unspecified: Secondary | ICD-10-CM

## 2023-10-03 DIAGNOSIS — M5459 Other low back pain: Secondary | ICD-10-CM | POA: Diagnosis not present

## 2023-10-03 DIAGNOSIS — G4733 Obstructive sleep apnea (adult) (pediatric): Secondary | ICD-10-CM | POA: Diagnosis not present

## 2023-10-03 DIAGNOSIS — E66813 Obesity, class 3: Secondary | ICD-10-CM

## 2023-10-03 DIAGNOSIS — Z6841 Body Mass Index (BMI) 40.0 and over, adult: Secondary | ICD-10-CM

## 2023-10-03 DIAGNOSIS — M6281 Muscle weakness (generalized): Secondary | ICD-10-CM | POA: Diagnosis not present

## 2023-10-03 NOTE — Therapy (Signed)
 OUTPATIENT PHYSICAL THERAPY TREATMENT   Patient Name: Ashley York MRN: [id number] DOB:[birth year], 68 y.o., female Today's Date: 10/03/2023  END OF SESSION:  PT End of Session - 10/03/23 1317     Visit Number 6    Number of Visits 17    Date for PT Re-Evaluation 11/03/23    Authorization Type [employer] MCR    PT Start Time 1317    PT Stop Time 1359    PT Time Calculation (min) 42 min             Past Medical History:  Diagnosis Date   Allergy    Back pain    CHOLELITHIASIS 05/10/2008   Qualifier: Diagnosis of  By: Tivis RN, Channing     CONSTIPATION 04/23/2008   Qualifier: Diagnosis of  By: Abran MD, Norleen SAILOR    Dyspnea on exertion    Gallstones    GERD (gastroesophageal reflux disease)    Heart murmur    HEMORRHOIDS, INTERNAL 04/19/2008   Qualifier: Diagnosis of  By: Claudene GUSS Railing     Hyperlipidemia    Hypertension    HYPERTENSION 04/19/2008   Qualifier: Diagnosis of  By: Claudene GUSS Railing     Hypothyroidism    HYPOTHYROIDISM 04/19/2008   Qualifier: Diagnosis of  By: Claudene GUSS Railing     Joint pain    Osteoarthritis    Overweight    Pre-diabetes    Sleep apnea    cpap   SOB (shortness of breath)    Spondylosis    Vitamin B 12 deficiency    Vitamin D  deficiency    Past Surgical History:  Procedure Laterality Date   CESAREAN SECTION  1983, 1984   COLONOSCOPY     KNEE SURGERY Right 03/22/2008   TOTAL KNEE ARTHROPLASTY Left 12/01/2020   Procedure: TOTAL KNEE ARTHROPLASTY;  Surgeon: Melodi Lerner, MD;  Location: [facility];  Service: Orthopedics;  Laterality: Left;    WISDOM TOOTH EXTRACTION  03/22/1997   Patient Active Problem List   Diagnosis Date Noted   Prediabetes 09/19/2023   OSA (obstructive sleep apnea) - mild on CPAP 09/19/2023   Pure hypercholesterolemia 09/19/2023   Anemia 09/19/2023   Daytime somnolence 09/19/2023   Vitamin D  deficiency 09/19/2023   OA (osteoarthritis) of knee 12/01/2020   Dyspnea on exertion 11/17/2017    CHOLELITHIASIS 05/10/2008   CONSTIPATION 04/23/2008   Hypothyroidism 04/19/2008   Essential hypertension 04/19/2008   HEMORRHOIDS, INTERNAL 04/19/2008    PCP: Rexanne Ingle, MD  REFERRING PROVIDER: Burnetta Aures, MD  REFERRING DIAG: M54.50 (ICD-10-CM) - Low back pain, unspecified   Rationale for Evaluation and Treatment: Rehabilitation  THERAPY DIAG:  Other low back pain  Muscle weakness (generalized)  Other abnormalities of gait and mobility  ONSET DATE: Chronic  SUBJECTIVE:  SUBJECTIVE STATEMENT: Pt presents to PT with no current pain or discomfort. Has been compliant with HEP with no adverse effect.   EVAL: Pt presents to PT with reports of chronic lower back pain with referral into LE R>L. Notes pain increases with prolonged standing and walking. Some N/T in bilateral LE also R>L. Does aquatic exercise and notes this seem sto help her discomfort some too. Denies bowel/bladder changes or saddle anesthesia.   PERTINENT HISTORY:  HTN: also bilat knee replacements   PAIN:  Are you having pain?  Yes: NPRS scale: 2/10 Worst: 7/10 Pain location: lower back,R LE Pain description: Tired Aggravating factors: standing, walking Relieving factors: rest, medication  PRECAUTIONS: Spondylolisthesis   RED FLAGS: None   WEIGHT BEARING RESTRICTIONS: No  FALLS:  Has patient fallen in last 6 months? No  LIVING ENVIRONMENT: Lives with: lives alone Lives in: House/apartment Stairs: No Has following equipment at home: Single point cane  OCCUPATION: Retired  PLOF: Independent  PATIENT GOALS: Pt wants to decrease back pain and improve comfort with standing and walking  OBJECTIVE:  Note: Objective measures were completed at Evaluation unless otherwise noted.  DIAGNOSTIC FINDINGS: See  imaging  PATIENT SURVEYS:  ODI: 18/50 - 36% disability  COGNITION: Overall cognitive status: Within functional limits for tasks assessed     SENSATION: [facility]  MUSCLE LENGTH: Thomas test: Right (+); Left (+)  POSTURE: rounded shoulders, forward head, and increased lumbar lordosis  PALPATION: TTP to lumbar paraspinals and R gluteals   LUMBAR ROM:   AROM eval  Flexion 50%  Extension 25%  Right lateral flexion   Left lateral flexion   Right rotation   Left rotation    (Blank rows = not tested)  LOWER EXTREMITY MMT:    MMT Right eval Left eval  Hip flexion 3+ 3+  Hip extension    Hip abduction 3+ 3+  Hip adduction    Hip internal rotation    Hip external rotation    Knee flexion 3+ 3+  Knee extension 3+ 3+  Ankle dorsiflexion    Ankle plantarflexion    Ankle inversion    Ankle eversion     (Blank rows = not tested)  LUMBAR SPECIAL TESTS:  Straight leg raise test: Negative and Slump test: Negative  FUNCTIONAL TESTS:  30 Second Sit to Stand: 12 reps with UE  GAIT: Distance walked: 9ft Assistive device utilized: None Level of assistance: Complete Independence Comments: flexed trunk  TREATMENT: [facility] Adult PT Treatment:                                                DATE: 10/03/23 Nustep level 5 UE/LE x 5 minutes for functional activity tolerance Supine PPT x 10 - 5 hold Supine PPT with ball 2x10 Pilates SLR 2x10 ea S/L hip abd x 10 ea Bridge x 10 GTB Repeated lumbar flex with swiss ball in sitting 2x10  Standing hip abd/ext 2x10 ea Assessment of tests/measures, goals, and outcomes  TREATMENT 09/30/23:  Aquatic therapy at [facility]- [address] - therapeutic pool temp 92 degrees Pt enters building independently.  Treatment took place in water  3.8 to  4 ft 8 in.feet deep depending upon activity.  Pt entered and exited the pool via stair and handrails    Aquatic Therapy:  Water  walking for warm up fwd/lat/bkwds  L stretch [employer]  carry [employer]  carry SL RDL with white barbell SL heel raise Plank on bench Plank on bench with alternating leg/arm lifts (hips flexed, neutral spine) Bil shoulder ext with walk back Lateral walking with band at toes Noodle stomp HS strech with noodle Water  bell balance ext/alt ext FT  Pt requires the buoyancy of water  for active assisted exercises with buoyancy supported for strengthening and AROM exercises. Hydrostatic pressure also supports joints by unweighting joint load by at least 50 % in 3-4 feet depth water . 80% in chest to neck deep water . Water  will provide assistance with movement using the current and laminar flow while the buoyancy reduces weight bearing. Pt requires the viscosity of the water  for resistance with strengthening exercises.     [facility] Adult PT Treatment:                                                DATE: 09/26/23 Nustep level 5 UE/LE x 5 minutes for functional activity tolerance Supine PPT x 10 - 5 hold Supine PPT with ball 2x10 Pilates SLR 2x10 ea Repeated lumbar flex with swiss ball in sitting 2x10  Repeated lateral flexion with swiss x 10 ea Lateral walk YTB x 2 laps at counter Standing hip abd/ext x 10 YTB STS 2x10 - no UE   [facility] Adult PT Treatment:                                                DATE: 09/21/23 Nustep level 5 UE/LE x 5 minutes  Standing hip abd/ext 2x10- added YTB at shins Repeated lumbar flex with swiss ball in sitting 2x10  Repeated lateral flexion with swiss x 10 ea Supine PPT with march 2x10, 1 x 10 wth GTB Supine GTB clam with PPT 10 x 2  Supine mini bridge with feet on ball x 8 Hip flexor stretch bilateral x 3  PPT with ball squeeze x 10  PATIENT EDUCATION:  Education details: eval findings, ODI, HEP, POC Person educated: Patient Education method: Explanation, Demonstration, and Handouts Education comprehension: verbalized understanding and returned demonstration  HOME EXERCISE PROGRAM: Access Code: [email or web address] URL:  [email or web address] Date: 09/12/2023 Prepared by: Alm Kingdom  Exercises - Modified Debby Stretch  - 1 x daily - 7 x weekly - 2 reps - 30 sec hold - Supine Posterior Pelvic Tilt  - 1 x daily - 7 x weekly - 2 sets - 10 reps - 5 sec hold - Active Straight Leg Raise with Quad Set  - 1 x daily - 7 x weekly - 2 sets - 10 reps - Supine Lower Trunk Rotation  - 1 x daily - 7 x weekly - 10 reps - Hooklying Clamshell with Resistance  - 1 x daily - 7 x weekly - 2-3 sets - 10 reps - green band hold - Standing Hip Abduction with Counter Support  - 1 x daily - 7 x weekly - 2 sets - 10 reps - Standing Hip Extension with Counter Support  - 1 x daily - 7 x weekly - 2 sets - 10 reps  ASSESSMENT:  CLINICAL IMPRESSION: Pt was able to complete prescribed exercises with no adverse effect or increase in pain. Today we focused again on core endurance strengthening and improving  proximal hip strength with progressed standing activity. Pt is progressing well with therapy, show improving functional mobility assessed via 30 Second Sit to Stand as well as increase in subjective function with ODI improvement. She continues to benefit from skilled PT services, will continue to progress as able per POC.   EVAL: Patient is a 68 y.o. F who was seen today for physical therapy evaluation and treatment for chronic LBP with referral into bilateral LE R>L. Physical findings are consistent with MD impression as pt demonstrates decrease in core and proximal hip strength and functional mobility. ODI score shows moderate disability in performance of home ADLs and community activities. Pt would benefit from skilled PT services working on improving core and proximal hip strength in order to decrease pain in gym and aquatic environments.   OBJECTIVE IMPAIRMENTS: Abnormal gait, decreased activity tolerance, decreased mobility, difficulty walking, decreased ROM, decreased strength, postural dysfunction, and pain   ACTIVITY  LIMITATIONS: carrying, lifting, standing, squatting, stairs, transfers, and locomotion level  PARTICIPATION LIMITATIONS: meal prep, cleaning, driving, shopping, community activity, occupation, and yard work  PERSONAL FACTORS: Time since onset of injury/illness/exacerbation and 1-2 comorbidities: HTN are also affecting patient's functional outcome.   REHAB POTENTIAL: Good  CLINICAL DECISION MAKING: Evolving/moderate complexity  EVALUATION COMPLEXITY: Moderate   GOALS: Goals reviewed with patient? No  SHORT TERM GOALS: Target date: 09/29/2023   Pt will be compliant and knowledgeable with initial HEP for improved comfort and carryover Baseline: initial HEP given  Goal status: MET  2.  Pt will self report low back pain no greater than 5/10 for improved comfort and functional ability Baseline: 7/10 at worst 10/03/2023: 2/10 at worst Goal status: MET   LONG TERM GOALS: Target date: 11/03/2023   Pt will be decrease ODI disability score to no greater than 25% (12/50) as proxy for functional improvement Baseline: 36% disability - 18/50  10/03/2023: 28% disability - 14/50 Goal status: IN PROGRESS  2.  Pt will self report low back pain no greater than 3/10 for improved comfort and functional ability Baseline: 7/10 at worst Goal status: INITIAL   3.  Pt will increase 30 Second Sit to Stand rep count to no less than 14 reps for improved balance, strength, and functional mobility Baseline: 12 reps with UE 10/03/2023: 13 reps no UE Goal status: IN PROGRESS   4.  Pt will improve standing and walking activity tolerance to no less than 30-45 minutes for improved comfort and functional ability with community activities  Baseline: 15 minutes Goal status: INITIAL   PLAN:  PT FREQUENCY: 2x/week  PT DURATION: 8 weeks  PLANNED INTERVENTIONS: 97164- PT Re-evaluation, 97110-Therapeutic exercises, 97530- Therapeutic activity, [id number]- Neuromuscular re-education, 97535- Self Care, [id number]- Manual  therapy, [id number]- Gait training, [phone]- Aquatic Therapy, [phone]- Electrical stimulation (unattended), [id number]- Electrical stimulation (manual), 20560 (1-2 muscles), [id number] (3+ muscles)- Dry Needling, Cryotherapy, and Moist heat  PLAN FOR NEXT SESSION: assess HEP response, neutral spine core strengthening, hip flexor stretching   Date of referral: 08/24/2023 Referring provider: Burnetta Aures, MD Referring diagnosis? M54.50 (ICD-10-CM) - Low back pain, unspecified  Treatment diagnosis? (if different than referring diagnosis) Other low back pain Muscle weakness (generalized) Other abnormalities of gait and mobility  What was this (referring dx) caused by? Arthritis  Nature of Condition: Chronic (continuous duration > 3 months)   Laterality: Both  Current Functional Measure Score:  ODI: 18/50 - 36% disability  Objective measurements identify impairments when they are compared to normal values, the uninvolved extremity, and prior level  of function.  [x]  Yes  []  No  Objective assessment of functional ability: Moderate functional limitations   Briefly describe symptoms:  Pt presents to PT with reports of chronic lower back pain with referral into LE R>L. Notes pain increases with prolonged standing and walking. Some N/T in bilateral LE also R>L. Does aquatic exercise and notes this seem sto help her discomfort some too. Denies bowel/bladder changes or saddle anesthesia.   How did symptoms start: Chronic onset with none trauma, slowly increasing  Average pain intensity:  Last 24 hours: 2/10  Past week: 7/10  How often does the pt experience symptoms? Frequently  How much have the symptoms interfered with usual daily activities? Moderately  How has condition changed since care began at this facility? NA - initial visit  In general, how is the patients overall health? Very Good   BACK PAIN (STarT Back Screening Tool) No   Alm JAYSON Kingdom PT  10/03/23 3:08 PM

## 2023-10-03 NOTE — Progress Notes (Signed)
 Office: [phone]  /  Fax: [phone]  Weight Summary and Body Composition Analysis (BIA)  Vitals Temp: 98.3 F (36.8 C) BP: 136/89 Pulse Rate: 74 SpO2: 98 %   Anthropometric Measurements Height: 5' 5.5 (1.664 m) Weight: 263 lb (119.3 kg) BMI (Calculated): 43.08 Weight at Last Visit: 263 lb Weight Lost Since Last Visit: 0 lb Weight Gained Since Last Visit: 0 lb Starting Weight: 263 lb Total Weight Loss (lbs): 0 lb (0 kg) Peak Weight: 270lb   Body Composition  Body Fat %: 53.9 % Fat Mass (lbs): 141.8 lbs Muscle Mass (lbs): 115 lbs Visceral Fat Rating : 19    RMR: [id number]  Today's Visit #: 2  Starting Date: 09/19/23   Subjective   Chief Complaint: Obesity  Interval History Discussed the use of AI scribe software for clinical note transcription with the patient, who gave verbal consent to proceed.  History of Present Illness   Ashley York is a 68 year old female with prediabetes, hypertension, and hyperlipidemia who presents for a post intake appointment focused on weight management.  She is seeking assistance with weight management to improve her overall health and reduce the risk of complications related to her prediabetes, hypertension, and hyperlipidemia. She has not yet begun an eating program due to challenges in meal planning and preparation. She wants to lose weight to feel better and improve her quality of life.  She engages in physical activity two to three days a week, primarily swimming and using a stationary bike at home. She aims for 1200 calories a day but struggles with meal planning and often eats late in the day, typically having two meals a day.  Her blood work indicates a blood sugar level of 93, normal kidney and liver function, LDL cholesterol of 83, triglycerides of 83, and normal vitamin D  and B12 levels. Her A1c has increased to 6.2. She is currently taking medication for cholesterol management.  She lives alone, with her  granddaughter occasionally staying with her. She is exploring meal planning strategies and is interested in using resources like the [employer] for healthy recipes. She is considering meal replacements as a convenient option to help manage her diet.  Her blood pressure was recorded at 136/89. She has a blood pressure machine at home but does not monitor her blood pressure as regularly as she should. She takes her blood pressure medication daily.         Challenges affecting patient progress: having difficulty with meal prep and planning and difficulty implementing reduced calorie nutrition plan.    Pharmacotherapy for weight management: She is currently taking no anti-obesity medication.   Assessment and Plan   Treatment Plan For Obesity:  Recommended Dietary Goals  Ashley York is currently in the action stage of change. As such, her goal is to continue weight management plan. She has agreed to: continue to work on [employer], [employer] of reduced calorie nutrition plan (RCNP)  [employer] and Counseling  We discussed the following behavioral modification strategies today: continue to work on implementation of reduced calorie nutritional plan and avoid all or none thinking.  Additional education and resources provided today: Handout on increasing daily activity and exercise goal setting and Provided with personal guidance and instructions on how to use [employer] for healthy meal ideas and cooking in bulk.  Recommended Physical Activity Goals  Ashley York has been advised to work up to 150 minutes of moderate intensity aerobic activity a week and strengthening exercises 2-3 times per week for cardiovascular  health, weight loss maintenance and preservation of muscle mass.   She has agreed to :  Start strengthening exercises with a goal of 2-3 sessions a week  and Start aerobic activity with a goal of 150 minutes a week at moderate intensity.   Medical Interventions and  Pharmacotherapy  We discussed various medication options to help Ashley York with her weight loss efforts and we both agreed to : Continue with current nutritional and behavioral strategies and discussed risk associated with the use of other peoples medications.  Associated Conditions Impacted by Obesity Treatment  Assessment & Plan Essential hypertension Blood pressure is above goal.  On amlodipine  and ARB hydrochlorothiazide  without adverse effects.    Most recent renal parameters reviewed and within normal limits.  Losing 10% of body weight may improve blood pressure control.  Continue current regimen  Pure hypercholesterolemia LDL is not at goal. Elevated LDL may be secondary to nutrition, genetics and spillover effect from excess adiposity. Recommended LDL goal is <70 to reduce the risk of fatty streaks and the progression to obstructive ASCVD in the future.   Her 10 year risk is: The 10-year ASCVD risk score (Arnett DK, et al., 2019) is: 10.4%  Lab Results  Component Value Date   CHOL 169 09/19/2023   HDL 71 09/19/2023   LDLCALC 83 09/19/2023   TRIG 83 09/19/2023    Continue weight loss therapy, losing 10% or more of body weight may improve condition.  May benefit from increasing statin intensity for an LDL goal of less than 70 consider intermediate cardiovascular risk    OSA (obstructive sleep apnea) - mild on CPAP Reviewed sleep study in the past for sleep apnea was mild for daytime somnolence is out of proportion.  Suspect she may have either narcolepsy or other causes of daytime fatigue and somnolence.  This may be related to insulin  resistance as well.  We will check serologies, please refer to orders.  May benefit from a follow-up sleep study to determine if current treatment is effective.  Losing 15% of body weight may reduce AHI Prediabetes Most recent A1c is  Lab Results  Component Value Date   HGBA1C 6.2 (H) 09/19/2023   HGBA1C 6.0 (H) 11/19/2020    Patient aware  of disease state and risk of progression. This may contribute to abnormal cravings, fatigue and diabetic complications without having diabetes.   We have discussed treatment options which include: losing 7 to 10% of body weight, increasing physical activity to a goal of 150 minutes a week at moderate intensity.  Advised to maintain a diet low on simple and processed carbohydrates.  Educated on the carb insulin  model for obesity  Patient has been considering someone else's Ozempic, person has diabetes.  We discussed that this is not recommended and could increase her risk of GLP-1 related adverse effects.  May also deprive her friend from getting treated.  Declined pharmacoprophylaxis with metformin  Class 3 severe obesity with serious comorbidity and body mass index (BMI) of 40.0 to 44.9 in adult, unspecified obesity type Struggling with weight management, impacting hypertension, prediabetes, and hyperlipidemia. Has not started a structured eating program and finds meal planning challenging. Emphasized importance of weight loss for improving quality of life and reducing health risks. Suggested meal replacements and prepackaged healthy meals as convenient options to initiate weight loss. Emphasized sustainable approach rather than fad diets like keto, which are not recommended for long-term success. Discussed aiming for 1200 calories per day for weight loss and gradual changes rather  than an all-or-nothing approach. - Provide handout on meal replacements and healthy eating - Encourage use of meal replacements for convenience - Suggest using the [employer] website for healthy recipes - Advise planning meals in advance, possibly on weekends - Encourage aiming for 1200 calories per day for weight loss - Discuss the importance of gradual changes rather than an all-or-nothing approach The 10-year ASCVD risk score (Arnett DK, et al., 2019) is: 10.4%   General Health Maintenance Discussed importance  of physical activity, recommending a combination of aerobic and strengthening exercises. Encouraged to aim for 5,000 to 10,000 steps per day and engage in 150 minutes of aerobic exercise per week, along with 2-3 sessions of  strengthening exercises. Highlighted benefits of strengthening exercises for postmenopausal women in preserving muscle mass and boosting metabolic rate. - Encourage 5,000 to 10,000 steps per day - Engage in 150 minutes of aerobic exercise per week - Incorporate 2-3 sessions of strengthening exercises per week  Follow-up Plan to follow up in four weeks to assess progress with weight management, blood pressure control, and overall health. - Schedule follow-up appointment in four weeks        Objective   Physical Exam:  Blood pressure 136/89, pulse 74, temperature 98.3 F (36.8 C), height 5' 5.5 (1.664 m), weight 263 lb (119.3 kg), SpO2 98%. Body mass index is 43.1 kg/m.  General: She is overweight, cooperative, alert, well developed, and in no acute distress. PSYCH: Has normal mood, affect and thought process.   HEENT: EOMI, sclerae are anicteric. Lungs: Normal breathing effort, no conversational dyspnea. Extremities: No edema.  Neurologic: No gross sensory or motor deficits. No tremors or fasciculations noted.    Diagnostic Data Reviewed:  BMET    Component Value Date/Time   NA 142 09/19/2023 0942   K 4.1 09/19/2023 0942   CL 103 09/19/2023 0942   CO2 24 09/19/2023 0942   GLUCOSE 93 09/19/2023 0942   GLUCOSE 158 (H) 12/02/2020 0304   BUN 19 09/19/2023 0942   CREATININE 0.86 09/19/2023 0942   CALCIUM  9.6 09/19/2023 0942   GFRNONAA >60 12/02/2020 0304   GFRAA  10/24/2008 1246    >60        The eGFR has been calculated using the MDRD equation. This calculation has not been validated in all clinical situations. eGFR's persistently <60 mL/min signify possible Chronic Kidney Disease.   Lab Results  Component Value Date   HGBA1C 6.2 (H)  09/19/2023   HGBA1C 6.0 (H) 11/19/2020   Lab Results  Component Value Date   INSULIN  9.7 09/19/2023   Lab Results  Component Value Date   TSH 2.280 09/19/2023   CBC    Component Value Date/Time   WBC 6.6 09/19/2023 0942   WBC 12.1 (H) 12/02/2020 0304   RBC 4.58 09/19/2023 0942   RBC 3.72 (L) 12/02/2020 0304   HGB 11.8 09/19/2023 0942   HCT 39.4 09/19/2023 0942   PLT 239 09/19/2023 0942   MCV 86 09/19/2023 0942   MCH 25.8 (L) 09/19/2023 0942   MCH 26.1 12/02/2020 0304   MCHC 29.9 (L) 09/19/2023 0942   MCHC 31.3 12/02/2020 0304   RDW 13.5 09/19/2023 0942   Iron Studies    Component Value Date/Time   IRON 48 09/19/2023 0942   TIBC 329 09/19/2023 0942   FERRITIN 45 09/19/2023 0942   IRONPCTSAT 15 09/19/2023 0942   Lipid Panel     Component Value Date/Time   CHOL 169 09/19/2023 0942   TRIG 83 09/19/2023  0942   HDL 71 09/19/2023 0942   LDLCALC 83 09/19/2023 0942   Hepatic Function Panel     Component Value Date/Time   PROT 7.3 09/19/2023 0942   ALBUMIN 4.3 09/19/2023 0942   AST 24 09/19/2023 0942   ALT 19 09/19/2023 0942   ALKPHOS 112 09/19/2023 0942   BILITOT 0.3 09/19/2023 0942   BILIDIR <0.10 01/12/2023 1501      Component Value Date/Time   TSH 2.280 09/19/2023 0942   Nutritional Lab Results  Component Value Date   VD25OH 54.7 09/19/2023    Medications: Outpatient Encounter Medications as of 10/03/2023  Medication Sig   Accu-Chek Softclix Lancets lancets Use to check your blood sugar finger stick once a day Dx Code E11.9 for 90 days   amLODipine  (NORVASC ) 10 MG tablet Take 10 mg by mouth daily.   atorvastatin  (LIPITOR) 20 MG tablet Take 20 mg by mouth daily.   Blood Glucose Monitoring Suppl (ACCU-CHEK GUIDE) w/Device KIT as directed to check blood sugar once a day Dx Code E11.9   cholecalciferol (VITAMIN D ) 25 MCG (1000 UNIT) tablet Take 1,000 Units by mouth daily.   Coenzyme Q10 (COQ10) 100 MG CAPS Take 100 mg by mouth daily.   Cyanocobalamin   (B-12 PO) Take 1 tablet by mouth daily.   levothyroxine  (SYNTHROID ) 150 MCG tablet Take 150 mcg by mouth daily before breakfast.   liothyronine (CYTOMEL) 5 MCG tablet Take 5 mcg by mouth daily.   losartan -hydrochlorothiazide  (HYZAAR) 100-25 MG tablet Take 1 tablet by mouth daily.    Magnesium  250 MG TABS Take 250 mg by mouth in the morning and at bedtime.   meloxicam (MOBIC) 15 MG tablet Take 1 tablet by mouth daily.   Omega-3 Fatty Acids (FISH OIL) 1000 MG CAPS 1 capsule Orally Once a day for 30 day(s)   pantoprazole  (PROTONIX ) 40 MG tablet Take 1 tablet (40 mg total) by mouth daily. (Patient not taking: Reported on 09/14/2023)   zinc gluconate 50 MG tablet Take 50 mg by mouth daily. (Patient not taking: Reported on 09/14/2023)   No facility-administered encounter medications on file as of 10/03/2023.     Follow-Up   No follow-ups on file.SABRA She was informed of the importance of frequent follow up visits to maximize her success with intensive lifestyle modifications for her multiple health conditions.  Attestation Statement   Reviewed by clinician on day of visit: allergies, medications, problem list, medical history, surgical history, family history, social history, and previous encounter notes.   I have spent 44 minutes in the care of the patient today including: 2 minutes before the visit reviewing and preparing the chart. 36 minutes face-to-face assessing and reviewing listed medical problems as outlined in obesity care plan, providing nutritional and behavioral counseling on topics outlined in the obesity care plan, counseling regarding anti-obesity medication as outlined in obesity care plan, independently interpreting test results and goals of care, as described in assessment and plan, and reviewing and discussing biometric information and progress 6 minutes after the visit updating chart and documentation of encounter.    Lucas Parker, MD

## 2023-10-03 NOTE — Assessment & Plan Note (Signed)
 Most recent A1c is  Lab Results  Component Value Date   HGBA1C 6.2 (H) 09/19/2023   HGBA1C 6.0 (H) 11/19/2020    Patient aware of disease state and risk of progression. This may contribute to abnormal cravings, fatigue and diabetic complications without having diabetes.   We have discussed treatment options which include: losing 7 to 10% of body weight, increasing physical activity to a goal of 150 minutes a week at moderate intensity.  Advised to maintain a diet low on simple and processed carbohydrates.  Educated on the carb insulin  model for obesity  Patient has been considering someone else's Ozempic, person has diabetes.  We discussed that this is not recommended and could increase her risk of GLP-1 related adverse effects.  May also deprive her friend from getting treated.  Declined pharmacoprophylaxis with metformin

## 2023-10-03 NOTE — Assessment & Plan Note (Signed)
 Blood pressure is above goal.  On amlodipine  and ARB hydrochlorothiazide  without adverse effects.    Most recent renal parameters reviewed and within normal limits.  Losing 10% of body weight may improve blood pressure control.  Continue current regimen

## 2023-10-03 NOTE — Assessment & Plan Note (Signed)
 Reviewed sleep study in the past for sleep apnea was mild for daytime somnolence is out of proportion.  Suspect she may have either narcolepsy or other causes of daytime fatigue and somnolence.  This may be related to insulin resistance as well.  We will check serologies, please refer to orders.  May benefit from a follow-up sleep study to determine if current treatment is effective.  Losing 15% of body weight may reduce AHI

## 2023-10-03 NOTE — Assessment & Plan Note (Signed)
 LDL is not at goal. Elevated LDL may be secondary to nutrition, genetics and spillover effect from excess adiposity. Recommended LDL goal is <70 to reduce the risk of fatty streaks and the progression to obstructive ASCVD in the future.   Her 10 year risk is: The 10-year ASCVD risk score (Arnett DK, et al., 2019) is: 10.4%  Lab Results  Component Value Date   CHOL 169 09/19/2023   HDL 71 09/19/2023   LDLCALC 83 09/19/2023   TRIG 83 09/19/2023    Continue weight loss therapy, losing 10% or more of body weight may improve condition.  May benefit from increasing statin intensity for an LDL goal of less than 70 consider intermediate cardiovascular risk

## 2023-10-04 NOTE — Therapy (Unsigned)
 OUTPATIENT PHYSICAL THERAPY TREATMENT   Patient Name: Ashley York MRN: [id number] DOB:[birth year], 68 y.o., female Today's Date: 10/07/2023  END OF SESSION:  PT End of Session - 10/06/23 1527     Visit Number 7    Number of Visits 17    Date for PT Re-Evaluation 11/03/23    Authorization Type [employer] MCR    Authorization Time Period 09/12/2023 - 11/07/2023    Authorization - Visit Number 6    Authorization - Number of Visits 6    PT Start Time 1530    PT Stop Time 1612    PT Time Calculation (min) 42 min    Activity Tolerance Patient tolerated treatment well    Behavior During Therapy [facility] for tasks assessed/performed              Past Medical History:  Diagnosis Date   Allergy    Back pain    CHOLELITHIASIS 05/10/2008   Qualifier: Diagnosis of  By: Tivis RN, Channing     CONSTIPATION 04/23/2008   Qualifier: Diagnosis of  By: Abran MD, Norleen SAILOR    Dyspnea on exertion    Gallstones    GERD (gastroesophageal reflux disease)    Heart murmur    HEMORRHOIDS, INTERNAL 04/19/2008   Qualifier: Diagnosis of  By: Claudene GUSS Railing     Hyperlipidemia    Hypertension    HYPERTENSION 04/19/2008   Qualifier: Diagnosis of  By: Claudene GUSS Railing     Hypothyroidism    HYPOTHYROIDISM 04/19/2008   Qualifier: Diagnosis of  By: Claudene GUSS Railing     Joint pain    Osteoarthritis    Overweight    Pre-diabetes    Sleep apnea    cpap   SOB (shortness of breath)    Spondylosis    Vitamin B 12 deficiency    Vitamin D  deficiency    Past Surgical History:  Procedure Laterality Date   CESAREAN SECTION  1983, 1984   COLONOSCOPY     KNEE SURGERY Right 03/22/2008   TOTAL KNEE ARTHROPLASTY Left 12/01/2020   Procedure: TOTAL KNEE ARTHROPLASTY;  Surgeon: Melodi Lerner, MD;  Location: [facility];  Service: Orthopedics;  Laterality: Left;    WISDOM TOOTH EXTRACTION  03/22/1997   Patient Active Problem List   Diagnosis Date Noted   Prediabetes 09/19/2023   OSA (obstructive  sleep apnea) - mild on CPAP 09/19/2023   Pure hypercholesterolemia 09/19/2023   Anemia 09/19/2023   Daytime somnolence 09/19/2023   Vitamin D  deficiency 09/19/2023   OA (osteoarthritis) of knee 12/01/2020   Dyspnea on exertion 11/17/2017   CHOLELITHIASIS 05/10/2008   CONSTIPATION 04/23/2008   Hypothyroidism 04/19/2008   Essential hypertension 04/19/2008   HEMORRHOIDS, INTERNAL 04/19/2008    PCP: Rexanne Ingle, MD  REFERRING PROVIDER: Burnetta Aures, MD  REFERRING DIAG: M54.50 (ICD-10-CM) - Low back pain, unspecified   Rationale for Evaluation and Treatment: Rehabilitation  THERAPY DIAG:  Other low back pain  Muscle weakness (generalized)  Other abnormalities of gait and mobility  ONSET DATE: Chronic  SUBJECTIVE:  SUBJECTIVE STATEMENT: Pt states she is doing well today with minimal pain  EVAL: Pt presents to PT with reports of chronic lower back pain with referral into LE R>L. Notes pain increases with prolonged standing and walking. Some N/T in bilateral LE also R>L. Does aquatic exercise and notes this seem sto help her discomfort some too. Denies bowel/bladder changes or saddle anesthesia.   PERTINENT HISTORY:  HTN: also bilat knee replacements   PAIN:  Are you having pain?  Yes: NPRS scale: 2/10 Worst: 7/10 Pain location: lower back,R LE Pain description: Tired Aggravating factors: standing, walking Relieving factors: rest, medication  PRECAUTIONS: Spondylolisthesis   RED FLAGS: None   WEIGHT BEARING RESTRICTIONS: No  FALLS:  Has patient fallen in last 6 months? No  LIVING ENVIRONMENT: Lives with: lives alone Lives in: House/apartment Stairs: No Has following equipment at home: Single point cane  OCCUPATION: Retired  PLOF: Independent  PATIENT GOALS: Pt wants to  decrease back pain and improve comfort with standing and walking  OBJECTIVE:  Note: Objective measures were completed at Evaluation unless otherwise noted.  DIAGNOSTIC FINDINGS: See imaging  PATIENT SURVEYS:  ODI: 18/50 - 36% disability  COGNITION: Overall cognitive status: Within functional limits for tasks assessed     SENSATION: [facility]  MUSCLE LENGTH: Thomas test: Right (+); Left (+)  POSTURE: rounded shoulders, forward head, and increased lumbar lordosis  PALPATION: TTP to lumbar paraspinals and R gluteals   LUMBAR ROM:   AROM eval  Flexion 50%  Extension 25%  Right lateral flexion   Left lateral flexion   Right rotation   Left rotation    (Blank rows = not tested)  LOWER EXTREMITY MMT:    MMT Right eval Left eval  Hip flexion 3+ 3+  Hip extension    Hip abduction 3+ 3+  Hip adduction    Hip internal rotation    Hip external rotation    Knee flexion 3+ 3+  Knee extension 3+ 3+  Ankle dorsiflexion    Ankle plantarflexion    Ankle inversion    Ankle eversion     (Blank rows = not tested)  LUMBAR SPECIAL TESTS:  Straight leg raise test: Negative and Slump test: Negative  FUNCTIONAL TESTS:  30 Second Sit to Stand: 12 reps with UE  GAIT: Distance walked: 13ft Assistive device utilized: None Level of assistance: Complete Independence Comments: flexed trunk  TREATMENT:  TREATMENT 10/06/23:  Aquatic therapy at [facility]- [address] - therapeutic pool temp 92 degrees Pt enters building independently.  Treatment took place in water  3.8 to  4 ft 8 in.feet deep depending upon activity.  Pt entered and exited the pool via stair and handrails    Aquatic Therapy:  Water  walking for warm up fwd/lat/bkwds  L stretch Farmers carry Suitcase carry SL RDL with white barbell to pool wall HS stretch at step Open book EOP Paloff press Blue DB Alternating Blue DB march with taps Noodle stomp HS strech with noodle Tandem walking Step up to  second step Noodle bicycle   Pt requires the buoyancy of water  for active assisted exercises with buoyancy supported for strengthening and AROM exercises. Hydrostatic pressure also supports joints by unweighting joint load by at least 50 % in 3-4 feet depth water . 80% in chest to neck deep water . Water  will provide assistance with movement using the current and laminar flow while the buoyancy reduces weight bearing. Pt requires the viscosity of the water  for resistance with strengthening exercises.  [facility] Adult PT  Treatment:                                                DATE: 10/03/23 Nustep level 5 UE/LE x 5 minutes for functional activity tolerance Supine PPT x 10 - 5 hold Supine PPT with ball 2x10 Pilates SLR 2x10 ea S/L hip abd x 10 ea Bridge x 10 GTB Repeated lumbar flex with swiss ball in sitting 2x10  Standing hip abd/ext 2x10 ea Assessment of tests/measures, goals, and outcomes  TREATMENT 09/30/23:  Aquatic therapy at [facility]- [address] - therapeutic pool temp 92 degrees Pt enters building independently.  Treatment took place in water  3.8 to  4 ft 8 in.feet deep depending upon activity.  Pt entered and exited the pool via stair and handrails    Aquatic Therapy:  Water  walking for warm up fwd/lat/bkwds  L stretch Farmers carry Suitcase carry SL RDL with white barbell SL heel raise Plank on bench Plank on bench with alternating leg/arm lifts (hips flexed, neutral spine) Bil shoulder ext with walk back Lateral walking with band at toes Noodle stomp HS strech with noodle Water  bell balance ext/alt ext FT  Pt requires the buoyancy of water  for active assisted exercises with buoyancy supported for strengthening and AROM exercises. Hydrostatic pressure also supports joints by unweighting joint load by at least 50 % in 3-4 feet depth water . 80% in chest to neck deep water . Water  will provide assistance with movement using the current and laminar flow while the  buoyancy reduces weight bearing. Pt requires the viscosity of the water  for resistance with strengthening exercises.     [facility] Adult PT Treatment:                                                DATE: 09/26/23 Nustep level 5 UE/LE x 5 minutes for functional activity tolerance Supine PPT x 10 - 5 hold Supine PPT with ball 2x10 Pilates SLR 2x10 ea Repeated lumbar flex with swiss ball in sitting 2x10  Repeated lateral flexion with swiss x 10 ea Lateral walk YTB x 2 laps at counter Standing hip abd/ext x 10 YTB STS 2x10 - no UE   [facility] Adult PT Treatment:                                                DATE: 09/21/23 Nustep level 5 UE/LE x 5 minutes  Standing hip abd/ext 2x10- added YTB at shins Repeated lumbar flex with swiss ball in sitting 2x10  Repeated lateral flexion with swiss x 10 ea Supine PPT with march 2x10, 1 x 10 wth GTB Supine GTB clam with PPT 10 x 2  Supine mini bridge with feet on ball x 8 Hip flexor stretch bilateral x 3  PPT with ball squeeze x 10  PATIENT EDUCATION:  Education details: eval findings, ODI, HEP, POC Person educated: Patient Education method: Explanation, Demonstration, and Handouts Education comprehension: verbalized understanding and returned demonstration  HOME EXERCISE PROGRAM: Access Code: [email or web address] URL: [email or web address] Date: 09/12/2023 Prepared by: Alm Kingdom  Exercises - Modified Debby Dales  - 1  x daily - 7 x weekly - 2 reps - 30 sec hold - Supine Posterior Pelvic Tilt  - 1 x daily - 7 x weekly - 2 sets - 10 reps - 5 sec hold - Active Straight Leg Raise with Quad Set  - 1 x daily - 7 x weekly - 2 sets - 10 reps - Supine Lower Trunk Rotation  - 1 x daily - 7 x weekly - 10 reps - Hooklying Clamshell with Resistance  - 1 x daily - 7 x weekly - 2-3 sets - 10 reps - green band hold - Standing Hip Abduction with Counter Support  - 1 x daily - 7 x weekly - 2 sets - 10 reps - Standing Hip Extension with Counter Support  - 1 x  daily - 7 x weekly - 2 sets - 10 reps  ASSESSMENT:  CLINICAL IMPRESSION: Session today focused on core and hip strengthening in the aquatic environment for use of buoyancy to offload joints and the viscosity of water  as resistance during therapeutic exercise.  We were able to increase intensity and volume of program today with pt noting fatigue but no increase in pian.  Will prepare robust aquatic HEP for pt to complete workouts independently.  Patient was able to tolerate all prescribed exercises in the aquatic environment with no adverse effects and reports 1/10 pain at the end of the session. Patient continues to benefit from skilled PT services on land and aquatic based and should be progressed as able to improve functional independence.   EVAL: Patient is a 68 y.o. F who was seen today for physical therapy evaluation and treatment for chronic LBP with referral into bilateral LE R>L. Physical findings are consistent with MD impression as pt demonstrates decrease in core and proximal hip strength and functional mobility. ODI score shows moderate disability in performance of home ADLs and community activities. Pt would benefit from skilled PT services working on improving core and proximal hip strength in order to decrease pain in gym and aquatic environments.   OBJECTIVE IMPAIRMENTS: Abnormal gait, decreased activity tolerance, decreased mobility, difficulty walking, decreased ROM, decreased strength, postural dysfunction, and pain   ACTIVITY LIMITATIONS: carrying, lifting, standing, squatting, stairs, transfers, and locomotion level  PARTICIPATION LIMITATIONS: meal prep, cleaning, driving, shopping, community activity, occupation, and yard work  PERSONAL FACTORS: Time since onset of injury/illness/exacerbation and 1-2 comorbidities: HTN are also affecting patient's functional outcome.   REHAB POTENTIAL: Good  CLINICAL DECISION MAKING: Evolving/moderate complexity  EVALUATION COMPLEXITY:  Moderate   GOALS: Goals reviewed with patient? No  SHORT TERM GOALS: Target date: 09/29/2023   Pt will be compliant and knowledgeable with initial HEP for improved comfort and carryover Baseline: initial HEP given  Goal status: MET  2.  Pt will self report low back pain no greater than 5/10 for improved comfort and functional ability Baseline: 7/10 at worst 10/03/2023: 2/10 at worst Goal status: MET   LONG TERM GOALS: Target date: 11/03/2023   Pt will be decrease ODI disability score to no greater than 25% (12/50) as proxy for functional improvement Baseline: 36% disability - 18/50  10/03/2023: 28% disability - 14/50 Goal status: IN PROGRESS  2.  Pt will self report low back pain no greater than 3/10 for improved comfort and functional ability Baseline: 7/10 at worst Goal status: INITIAL   3.  Pt will increase 30 Second Sit to Stand rep count to no less than 14 reps for improved balance, strength, and functional mobility Baseline: 12  reps with UE 10/03/2023: 13 reps no UE Goal status: IN PROGRESS   4.  Pt will improve standing and walking activity tolerance to no less than 30-45 minutes for improved comfort and functional ability with community activities  Baseline: 15 minutes Goal status: INITIAL   PLAN:  PT FREQUENCY: 2x/week  PT DURATION: 8 weeks  PLANNED INTERVENTIONS: [id number]- PT Re-evaluation, 97110-Therapeutic exercises, 97530- Therapeutic activity, [id number]- Neuromuscular re-education, 97535- Self Care, [id number]- Manual therapy, [id number]- Gait training, [phone]- Aquatic Therapy, [phone]- Electrical stimulation (unattended), [id number]- Electrical stimulation (manual), 20560 (1-2 muscles), [id number] (3+ muscles)- Dry Needling, Cryotherapy, and Moist heat  PLAN FOR NEXT SESSION: assess HEP response, neutral spine core strengthening, hip flexor stretching   Date of referral: 08/24/2023 Referring provider: Burnetta Aures, MD Referring diagnosis? M54.50 (ICD-10-CM) - Low back pain, unspecified   Treatment diagnosis? (if different than referring diagnosis) Other low back pain Muscle weakness (generalized) Other abnormalities of gait and mobility  What was this (referring dx) caused by? Arthritis  Nature of Condition: Chronic (continuous duration > 3 months)   Laterality: Both  Current Functional Measure Score:  ODI: 18/50 - 36% disability  Objective measurements identify impairments when they are compared to normal values, the uninvolved extremity, and prior level of function.  [x]  Yes  []  No  Objective assessment of functional ability: Moderate functional limitations   Briefly describe symptoms:  Pt presents to PT with reports of chronic lower back pain with referral into LE R>L. Notes pain increases with prolonged standing and walking. Some N/T in bilateral LE also R>L. Does aquatic exercise and notes this seem sto help her discomfort some too. Denies bowel/bladder changes or saddle anesthesia.   How did symptoms start: Chronic onset with none trauma, slowly increasing  Average pain intensity:  Last 24 hours: 2/10  Past week: 7/10  How often does the pt experience symptoms? Frequently  How much have the symptoms interfered with usual daily activities? Moderately  How has condition changed since care began at this facility? NA - initial visit  In general, how is the patients overall health? Very Good   BACK PAIN (STarT Back Screening Tool) No     Helene BRAVO Natsuko Kelsay PT  10/07/23 8:45 AM

## 2023-10-05 DIAGNOSIS — Z96651 Presence of right artificial knee joint: Secondary | ICD-10-CM | POA: Diagnosis not present

## 2023-10-05 DIAGNOSIS — M4807 Spinal stenosis, lumbosacral region: Secondary | ICD-10-CM | POA: Diagnosis not present

## 2023-10-05 DIAGNOSIS — M5416 Radiculopathy, lumbar region: Secondary | ICD-10-CM | POA: Diagnosis not present

## 2023-10-06 ENCOUNTER — Ambulatory Visit: Admitting: Physical Therapy

## 2023-10-06 ENCOUNTER — Encounter: Payer: Self-pay | Admitting: Physical Therapy

## 2023-10-06 DIAGNOSIS — R2689 Other abnormalities of gait and mobility: Secondary | ICD-10-CM | POA: Diagnosis not present

## 2023-10-06 DIAGNOSIS — M5459 Other low back pain: Secondary | ICD-10-CM | POA: Diagnosis not present

## 2023-10-06 DIAGNOSIS — M6281 Muscle weakness (generalized): Secondary | ICD-10-CM | POA: Diagnosis not present

## 2023-10-10 ENCOUNTER — Ambulatory Visit

## 2023-10-10 DIAGNOSIS — M6281 Muscle weakness (generalized): Secondary | ICD-10-CM | POA: Diagnosis not present

## 2023-10-10 DIAGNOSIS — M5459 Other low back pain: Secondary | ICD-10-CM

## 2023-10-10 DIAGNOSIS — R2689 Other abnormalities of gait and mobility: Secondary | ICD-10-CM

## 2023-10-10 NOTE — Therapy (Signed)
 OUTPATIENT PHYSICAL THERAPY TREATMENT   Patient Name: Ashley York MRN: [id number] DOB:[birth year], 68 y.o., female Today's Date: 10/10/2023  END OF SESSION:  PT End of Session - 10/10/23 1618     Visit Number 8    Number of Visits 17    Date for PT Re-Evaluation 11/03/23    Authorization Type [employer] MCR    Authorization Time Period 09/12/2023 - 11/07/2023    Authorization - Number of Visits 6    PT Start Time 1531    PT Stop Time 1613    PT Time Calculation (min) 42 min    Activity Tolerance Patient tolerated treatment well    Behavior During Therapy [facility] for tasks assessed/performed               Past Medical History:  Diagnosis Date   Allergy    Back pain    CHOLELITHIASIS 05/10/2008   Qualifier: Diagnosis of  By: Tivis RN, Channing     CONSTIPATION 04/23/2008   Qualifier: Diagnosis of  By: Abran MD, Norleen SAILOR    Dyspnea on exertion    Gallstones    GERD (gastroesophageal reflux disease)    Heart murmur    HEMORRHOIDS, INTERNAL 04/19/2008   Qualifier: Diagnosis of  By: Claudene GUSS Railing     Hyperlipidemia    Hypertension    HYPERTENSION 04/19/2008   Qualifier: Diagnosis of  By: Claudene GUSS Railing     Hypothyroidism    HYPOTHYROIDISM 04/19/2008   Qualifier: Diagnosis of  By: Claudene GUSS Railing     Joint pain    Osteoarthritis    Overweight    Pre-diabetes    Sleep apnea    cpap   SOB (shortness of breath)    Spondylosis    Vitamin B 12 deficiency    Vitamin D  deficiency    Past Surgical History:  Procedure Laterality Date   CESAREAN SECTION  1983, 1984   COLONOSCOPY     KNEE SURGERY Right 03/22/2008   TOTAL KNEE ARTHROPLASTY Left 12/01/2020   Procedure: TOTAL KNEE ARTHROPLASTY;  Surgeon: Melodi Lerner, MD;  Location: [facility];  Service: Orthopedics;  Laterality: Left;    WISDOM TOOTH EXTRACTION  03/22/1997   Patient Active Problem List   Diagnosis Date Noted   Prediabetes 09/19/2023   OSA (obstructive sleep apnea) - mild on CPAP  09/19/2023   Pure hypercholesterolemia 09/19/2023   Anemia 09/19/2023   Daytime somnolence 09/19/2023   Vitamin D  deficiency 09/19/2023   OA (osteoarthritis) of knee 12/01/2020   Dyspnea on exertion 11/17/2017   CHOLELITHIASIS 05/10/2008   CONSTIPATION 04/23/2008   Hypothyroidism 04/19/2008   Essential hypertension 04/19/2008   HEMORRHOIDS, INTERNAL 04/19/2008    PCP: Rexanne Ingle, MD  REFERRING PROVIDER: Burnetta Aures, MD  REFERRING DIAG: M54.50 (ICD-10-CM) - Low back pain, unspecified   Rationale for Evaluation and Treatment: Rehabilitation  THERAPY DIAG:  Other low back pain  Muscle weakness (generalized)  Other abnormalities of gait and mobility  ONSET DATE: Chronic  SUBJECTIVE:  SUBJECTIVE STATEMENT: Pt reports to the clinic feeling good. Less pain but still feeling sore. Been getting cramps in the back of her thighs   EVAL: Pt presents to PT with reports of chronic lower back pain with referral into LE R>L. Notes pain increases with prolonged standing and walking. Some N/T in bilateral LE also R>L. Does aquatic exercise and notes this seem sto help her discomfort some too. Denies bowel/bladder changes or saddle anesthesia.   PERTINENT HISTORY:  HTN: also bilat knee replacements   PAIN:  Are you having pain?  Yes: NPRS scale: 2/10 Worst: 7/10 Pain location: lower back,R LE Pain description: Tired Aggravating factors: standing, walking Relieving factors: rest, medication  PRECAUTIONS: Spondylolisthesis   RED FLAGS: None   WEIGHT BEARING RESTRICTIONS: No  FALLS:  Has patient fallen in last 6 months? No  LIVING ENVIRONMENT: Lives with: lives alone Lives in: House/apartment Stairs: No Has following equipment at home: Single point cane  OCCUPATION: Retired  PLOF:  Independent  PATIENT GOALS: Pt wants to decrease back pain and improve comfort with standing and walking  OBJECTIVE:  Note: Objective measures were completed at Evaluation unless otherwise noted.  DIAGNOSTIC FINDINGS: See imaging  PATIENT SURVEYS:  ODI: 18/50 - 36% disability  COGNITION: Overall cognitive status: Within functional limits for tasks assessed     SENSATION: [facility]  MUSCLE LENGTH: Thomas test: Right (+); Left (+)  POSTURE: rounded shoulders, forward head, and increased lumbar lordosis  PALPATION: TTP to lumbar paraspinals and R gluteals   LUMBAR ROM:   AROM eval  Flexion 50%  Extension 25%  Right lateral flexion   Left lateral flexion   Right rotation   Left rotation    (Blank rows = not tested)  LOWER EXTREMITY MMT:    MMT Right eval Left eval  Hip flexion 3+ 3+  Hip extension    Hip abduction 3+ 3+  Hip adduction    Hip internal rotation    Hip external rotation    Knee flexion 3+ 3+  Knee extension 3+ 3+  Ankle dorsiflexion    Ankle plantarflexion    Ankle inversion    Ankle eversion     (Blank rows = not tested)  LUMBAR SPECIAL TESTS:  Straight leg raise test: Negative and Slump test: Negative  FUNCTIONAL TESTS:  30 Second Sit to Stand: 12 reps with UE  GAIT: Distance walked: 81ft Assistive device utilized: None Level of assistance: Complete Independence Comments: flexed trunk  TREATMENT: [facility] Adult PT Treatment:                                                DATE: 10/10/23 Therapeutic Exercise Nustep level 5 UE/LE x 4 minutes for functional activity tolerance Supine PPT with ball 1x10 disc. d/t cramping bilateral hamstrings  Pilates SLR 1x10 ea S/L hip abd x 8 ea Bridge 1x10 - disc. d/t cramping bilateral hamstrings  Supine hamstring stretch 30 sec ea Repeated lumbar flex with swiss ball in sitting 2x10  Standing hip abd/ext 1x10 ea Therapeutic Activity STS table 2x10 - 1 set high 1 low Standing hip flexion to step - 3#  ankle weight 2x10 each  Step ups 4in x10 ea Lateral Step ups 4in 10 ea  TREATMENT 10/06/23:  Aquatic therapy at [facility]- [address] - therapeutic pool temp 92 degrees Pt enters building independently.  Treatment took  place in water  3.8 to  4 ft 8 in.feet deep depending upon activity.  Pt entered and exited the pool via stair and handrails    Aquatic Therapy:  Water  walking for warm up fwd/lat/bkwds  L stretch Farmers carry Suitcase carry SL RDL with white barbell to pool wall HS stretch at step Open book EOP Paloff press Blue DB Alternating Blue DB march with taps Noodle stomp HS strech with noodle Tandem walking Step up to second step Noodle bicycle   Pt requires the buoyancy of water  for active assisted exercises with buoyancy supported for strengthening and AROM exercises. Hydrostatic pressure also supports joints by unweighting joint load by at least 50 % in 3-4 feet depth water . 80% in chest to neck deep water . Water  will provide assistance with movement using the current and laminar flow while the buoyancy reduces weight bearing. Pt requires the viscosity of the water  for resistance with strengthening exercises.  [facility] Adult PT Treatment:                                                DATE: 10/03/23 Nustep level 5 UE/LE x 5 minutes for functional activity tolerance Supine PPT x 10 - 5 hold Supine PPT with ball 2x10 Pilates SLR 2x10 ea S/L hip abd x 10 ea Bridge x 10 GTB Repeated lumbar flex with swiss ball in sitting 2x10  Standing hip abd/ext 2x10 ea Assessment of tests/measures, goals, and outcomes  TREATMENT 09/30/23:  Aquatic therapy at [facility]- [address] - therapeutic pool temp 92 degrees Pt enters building independently.  Treatment took place in water  3.8 to  4 ft 8 in.feet deep depending upon activity.  Pt entered and exited the pool via stair and handrails    Aquatic Therapy:  Water  walking for warm up fwd/lat/bkwds  L  stretch Farmers carry Suitcase carry SL RDL with white barbell SL heel raise Plank on bench Plank on bench with alternating leg/arm lifts (hips flexed, neutral spine) Bil shoulder ext with walk back Lateral walking with band at toes Noodle stomp HS strech with noodle Water  bell balance ext/alt ext FT  Pt requires the buoyancy of water  for active assisted exercises with buoyancy supported for strengthening and AROM exercises. Hydrostatic pressure also supports joints by unweighting joint load by at least 50 % in 3-4 feet depth water . 80% in chest to neck deep water . Water  will provide assistance with movement using the current and laminar flow while the buoyancy reduces weight bearing. Pt requires the viscosity of the water  for resistance with strengthening exercises.     [facility] Adult PT Treatment:                                                DATE: 09/26/23 Nustep level 5 UE/LE x 5 minutes for functional activity tolerance Supine PPT x 10 - 5 hold Supine PPT with ball 2x10 Pilates SLR 2x10 ea Repeated lumbar flex with swiss ball in sitting 2x10  Repeated lateral flexion with swiss x 10 ea Lateral walk YTB x 2 laps at counter Standing hip abd/ext x 10 YTB STS 2x10 - no UE   [facility] Adult PT Treatment:  DATE: 09/21/23 Nustep level 5 UE/LE x 5 minutes  Standing hip abd/ext 2x10- added YTB at shins Repeated lumbar flex with swiss ball in sitting 2x10  Repeated lateral flexion with swiss x 10 ea Supine PPT with march 2x10, 1 x 10 wth GTB Supine GTB clam with PPT 10 x 2  Supine mini bridge with feet on ball x 8 Hip flexor stretch bilateral x 3  PPT with ball squeeze x 10  PATIENT EDUCATION:  Education details: eval findings, ODI, HEP, POC Person educated: Patient Education method: Explanation, Demonstration, and Handouts Education comprehension: verbalized understanding and returned demonstration  HOME EXERCISE PROGRAM: Access Code:  [email or web address] URL: [email or web address] Date: 09/12/2023 Prepared by: Alm Kingdom  Exercises - Modified Debby Stretch  - 1 x daily - 7 x weekly - 2 reps - 30 sec hold - Supine Posterior Pelvic Tilt  - 1 x daily - 7 x weekly - 2 sets - 10 reps - 5 sec hold - Active Straight Leg Raise with Quad Set  - 1 x daily - 7 x weekly - 2 sets - 10 reps - Supine Lower Trunk Rotation  - 1 x daily - 7 x weekly - 10 reps - Hooklying Clamshell with Resistance  - 1 x daily - 7 x weekly - 2-3 sets - 10 reps - green band hold - Standing Hip Abduction with Counter Support  - 1 x daily - 7 x weekly - 2 sets - 10 reps - Standing Hip Extension with Counter Support  - 1 x daily - 7 x weekly - 2 sets - 10 reps  ASSESSMENT:  CLINICAL IMPRESSION: Pt came to PT in minimal pain today. Session focused on lumbar mobility and core and LE strengthening in supine and standing positions. Pt experienced bilateral cramping during bridges today, performed hamstring 30 sec hold stretch and reassessed, pt still experienced cramps. Sit to stands performed with good control and minimal UE use at both high and low tables. Standing activities all performed within tolerance, pt reported feeling more strain on her right side during standing activities. Plan to progress sessions to have a greater focus on standing activities and gait training. Pt continues to require skilled PT to address deficits in LE strength and motor control as well as low back pain.   EVAL: Patient is a 68 y.o. F who was seen today for physical therapy evaluation and treatment for chronic LBP with referral into bilateral LE R>L. Physical findings are consistent with MD impression as pt demonstrates decrease in core and proximal hip strength and functional mobility. ODI score shows moderate disability in performance of home ADLs and community activities. Pt would benefit from skilled PT services working on improving core and proximal hip strength in order to  decrease pain in gym and aquatic environments.   OBJECTIVE IMPAIRMENTS: Abnormal gait, decreased activity tolerance, decreased mobility, difficulty walking, decreased ROM, decreased strength, postural dysfunction, and pain   ACTIVITY LIMITATIONS: carrying, lifting, standing, squatting, stairs, transfers, and locomotion level  PARTICIPATION LIMITATIONS: meal prep, cleaning, driving, shopping, community activity, occupation, and yard work  PERSONAL FACTORS: Time since onset of injury/illness/exacerbation and 1-2 comorbidities: HTN are also affecting patient's functional outcome.   REHAB POTENTIAL: Good  CLINICAL DECISION MAKING: Evolving/moderate complexity  EVALUATION COMPLEXITY: Moderate   GOALS: Goals reviewed with patient? No  SHORT TERM GOALS: Target date: 09/29/2023   Pt will be compliant and knowledgeable with initial HEP for improved comfort and carryover Baseline: initial HEP given  Goal status: MET  2.  Pt will self report low back pain no greater than 5/10 for improved comfort and functional ability Baseline: 7/10 at worst 10/03/2023: 2/10 at worst Goal status: MET   LONG TERM GOALS: Target date: 11/03/2023   Pt will be decrease ODI disability score to no greater than 25% (12/50) as proxy for functional improvement Baseline: 36% disability - 18/50  10/03/2023: 28% disability - 14/50 Goal status: IN PROGRESS  2.  Pt will self report low back pain no greater than 3/10 for improved comfort and functional ability Baseline: 7/10 at worst Goal status: INITIAL   3.  Pt will increase 30 Second Sit to Stand rep count to no less than 14 reps for improved balance, strength, and functional mobility Baseline: 12 reps with UE 10/03/2023: 13 reps no UE Goal status: IN PROGRESS   4.  Pt will improve standing and walking activity tolerance to no less than 30-45 minutes for improved comfort and functional ability with community activities  Baseline: 15 minutes Goal status:  INITIAL   PLAN:  PT FREQUENCY: 2x/week  PT DURATION: 8 weeks  PLANNED INTERVENTIONS: 97164- PT Re-evaluation, 97110-Therapeutic exercises, 97530- Therapeutic activity, [id number]- Neuromuscular re-education, 97535- Self Care, [id number]- Manual therapy, [id number]- Gait training, [phone]- Aquatic Therapy, [phone]- Electrical stimulation (unattended), [id number]- Electrical stimulation (manual), 20560 (1-2 muscles), [id number] (3+ muscles)- Dry Needling, Cryotherapy, and Moist heat  PLAN FOR NEXT SESSION: assess HEP response, neutral spine core strengthening, hip flexor stretching   Date of referral: 08/24/2023 Referring provider: Burnetta Aures, MD Referring diagnosis? M54.50 (ICD-10-CM) - Low back pain, unspecified  Treatment diagnosis? (if different than referring diagnosis) Other low back pain Muscle weakness (generalized) Other abnormalities of gait and mobility  What was this (referring dx) caused by? Arthritis  Nature of Condition: Chronic (continuous duration > 3 months)   Laterality: Both  Current Functional Measure Score:  ODI: 18/50 - 36% disability  Objective measurements identify impairments when they are compared to normal values, the uninvolved extremity, and prior level of function.  [x]  Yes  []  No  Objective assessment of functional ability: Moderate functional limitations   Briefly describe symptoms:  Pt presents to PT with reports of chronic lower back pain with referral into LE R>L. Notes pain increases with prolonged standing and walking. Some N/T in bilateral LE also R>L. Does aquatic exercise and notes this seem sto help her discomfort some too. Denies bowel/bladder changes or saddle anesthesia.   How did symptoms start: Chronic onset with none trauma, slowly increasing  Average pain intensity:  Last 24 hours: 2/10  Past week: 7/10  How often does the pt experience symptoms? Frequently  How much have the symptoms interfered with usual daily activities? Moderately  How has  condition changed since care began at this facility? NA - initial visit  In general, how is the patients overall health? Very Good   BACK PAIN (STarT Back Screening Tool) No    Holly Schneider, SPT 10/10/23 4:21 PM

## 2023-10-13 ENCOUNTER — Encounter: Payer: Self-pay | Admitting: Physical Therapy

## 2023-10-13 ENCOUNTER — Ambulatory Visit: Admitting: Physical Therapy

## 2023-10-13 DIAGNOSIS — M6281 Muscle weakness (generalized): Secondary | ICD-10-CM | POA: Diagnosis not present

## 2023-10-13 DIAGNOSIS — R2689 Other abnormalities of gait and mobility: Secondary | ICD-10-CM

## 2023-10-13 DIAGNOSIS — M5459 Other low back pain: Secondary | ICD-10-CM

## 2023-10-13 NOTE — Therapy (Unsigned)
 OUTPATIENT PHYSICAL THERAPY TREATMENT   Patient Name: Ashley York MRN: [id number] DOB:February 18, 1956, 68 y.o., female Today's Date: 10/14/2023  END OF SESSION:  PT End of Session - 10/13/23 1532     Visit Number 9    Number of Visits 17    Date for PT Re-Evaluation 11/03/23    Authorization Type [employer] MCR    Authorization Time Period 09/12/2023 - 11/07/2023    Authorization - Number of Visits 6    PT Start Time 1530    PT Stop Time 1611    PT Time Calculation (min) 41 min    Activity Tolerance Patient tolerated treatment well    Behavior During Therapy [facility] for tasks assessed/performed                Past Medical History:  Diagnosis Date   Allergy    Back pain    CHOLELITHIASIS 05/10/2008   Qualifier: Diagnosis of  By: Tivis RN, Channing     CONSTIPATION 04/23/2008   Qualifier: Diagnosis of  By: Abran MD, Norleen SAILOR    Dyspnea on exertion    Gallstones    GERD (gastroesophageal reflux disease)    Heart murmur    HEMORRHOIDS, INTERNAL 04/19/2008   Qualifier: Diagnosis of  By: Claudene GUSS Railing     Hyperlipidemia    Hypertension    HYPERTENSION 04/19/2008   Qualifier: Diagnosis of  By: Claudene GUSS Railing     Hypothyroidism    HYPOTHYROIDISM 04/19/2008   Qualifier: Diagnosis of  By: Claudene GUSS Railing     Joint pain    Osteoarthritis    Overweight    Pre-diabetes    Sleep apnea    cpap   SOB (shortness of breath)    Spondylosis    Vitamin B 12 deficiency    Vitamin D  deficiency    Past Surgical History:  Procedure Laterality Date   CESAREAN SECTION  1983, 1984   COLONOSCOPY     KNEE SURGERY Right 03/22/2008   TOTAL KNEE ARTHROPLASTY Left 12/01/2020   Procedure: TOTAL KNEE ARTHROPLASTY;  Surgeon: Melodi Lerner, MD;  Location: [facility];  Service: Orthopedics;  Laterality: Left;    WISDOM TOOTH EXTRACTION  03/22/1997   Patient Active Problem List   Diagnosis Date Noted   Prediabetes 09/19/2023   OSA (obstructive sleep apnea) - mild on CPAP  09/19/2023   Pure hypercholesterolemia 09/19/2023   Anemia 09/19/2023   Daytime somnolence 09/19/2023   Vitamin D  deficiency 09/19/2023   OA (osteoarthritis) of knee 12/01/2020   Dyspnea on exertion 11/17/2017   CHOLELITHIASIS 05/10/2008   CONSTIPATION 04/23/2008   Hypothyroidism 04/19/2008   Essential hypertension 04/19/2008   HEMORRHOIDS, INTERNAL 04/19/2008    PCP: Rexanne Ingle, MD  REFERRING PROVIDER: Burnetta Aures, MD  REFERRING DIAG: M54.50 (ICD-10-CM) - Low back pain, unspecified   Rationale for Evaluation and Treatment: Rehabilitation  THERAPY DIAG:  Other low back pain  Muscle weakness (generalized)  Other abnormalities of gait and mobility  ONSET DATE: Chronic  SUBJECTIVE:  SUBJECTIVE STATEMENT: Pt reports she is doing well with low pain levels today.  EVAL: Pt presents to PT with reports of chronic lower back pain with referral into LE R>L. Notes pain increases with prolonged standing and walking. Some N/T in bilateral LE also R>L. Does aquatic exercise and notes this seem sto help her discomfort some too. Denies bowel/bladder changes or saddle anesthesia.   PERTINENT HISTORY:  HTN: also bilat knee replacements   PAIN:  Are you having pain?  Yes: NPRS scale: 2/10 Worst: 7/10 Pain location: lower back,R LE Pain description: Tired Aggravating factors: standing, walking Relieving factors: rest, medication  PRECAUTIONS: Spondylolisthesis   RED FLAGS: None   WEIGHT BEARING RESTRICTIONS: No  FALLS:  Has patient fallen in last 6 months? No  LIVING ENVIRONMENT: Lives with: lives alone Lives in: House/apartment Stairs: No Has following equipment at home: Single point cane  OCCUPATION: Retired  PLOF: Independent  PATIENT GOALS: Pt wants to decrease back pain and  improve comfort with standing and walking  OBJECTIVE:  Note: Objective measures were completed at Evaluation unless otherwise noted.  DIAGNOSTIC FINDINGS: See imaging  PATIENT SURVEYS:  ODI: 18/50 - 36% disability  COGNITION: Overall cognitive status: Within functional limits for tasks assessed     SENSATION: [facility]  MUSCLE LENGTH: Thomas test: Right (+); Left (+)  POSTURE: rounded shoulders, forward head, and increased lumbar lordosis  PALPATION: TTP to lumbar paraspinals and R gluteals   LUMBAR ROM:   AROM eval  Flexion 50%  Extension 25%  Right lateral flexion   Left lateral flexion   Right rotation   Left rotation    (Blank rows = not tested)  LOWER EXTREMITY MMT:    MMT Right eval Left eval  Hip flexion 3+ 3+  Hip extension    Hip abduction 3+ 3+  Hip adduction    Hip internal rotation    Hip external rotation    Knee flexion 3+ 3+  Knee extension 3+ 3+  Ankle dorsiflexion    Ankle plantarflexion    Ankle inversion    Ankle eversion     (Blank rows = not tested)  LUMBAR SPECIAL TESTS:  Straight leg raise test: Negative and Slump test: Negative  FUNCTIONAL TESTS:  30 Second Sit to Stand: 12 reps with UE  GAIT: Distance walked: 21ft Assistive device utilized: None Level of assistance: Complete Independence Comments: flexed trunk  TREATMENT:  TREATMENT 10/13/23:  Aquatic therapy at [facility] [address] - therapeutic pool temp 92 degrees Pt enters building independently.  Treatment took place in water  3.8 to  4 ft 8 in.feet deep depending upon activity.  Pt entered and exited the pool via stair and handrails    Aquatic Therapy:  Water  walking for warm up fwd/lat/bkwds  L stretch Farmers carry Suitcase carry SL RDL with white barbell to pool wall Step up with march Deep water  jogging Deep water  jumping jack HS stretch at step Slow march with water  bells Noodle stomp Water  bell - shoulder flex/ext - narrow bos Alternating   Tandem walking reverse Step up fwd and lat with march Walking lunges -    Pt requires the buoyancy of water  for active assisted exercises with buoyancy supported for strengthening and AROM exercises. Hydrostatic pressure also supports joints by unweighting joint load by at least 50 % in 3-4 feet depth water . 80% in chest to neck deep water . Water  will provide assistance with movement using the current and laminar flow while the buoyancy reduces weight bearing. Pt  requires the viscosity of the water  for resistance with strengthening exercises.  [facility] Adult PT Treatment:                                                DATE: 10/10/23 Therapeutic Exercise Nustep level 5 UE/LE x 4 minutes for functional activity tolerance Supine PPT with ball 1x10 disc. d/t cramping bilateral hamstrings  Pilates SLR 1x10 ea S/L hip abd x 8 ea Bridge 1x10 - disc. d/t cramping bilateral hamstrings  Supine hamstring stretch 30 sec ea Repeated lumbar flex with swiss ball in sitting 2x10  Standing hip abd/ext 1x10 ea Therapeutic Activity STS table 2x10 - 1 set high 1 low Standing hip flexion to step - 3# ankle weight 2x10 each  Step ups 4in x10 ea Lateral Step ups 4in 10 ea  TREATMENT 10/06/23:  Aquatic therapy at [facility] [address] - therapeutic pool temp 92 degrees Pt enters building independently.  Treatment took place in water  3.8 to  4 ft 8 in.feet deep depending upon activity.  Pt entered and exited the pool via stair and handrails    Aquatic Therapy:  Water  walking for warm up fwd/lat/bkwds  L stretch Farmers carry Suitcase carry SL RDL with white barbell to pool wall HS stretch at step Open book EOP Paloff press Blue DB Alternating Blue DB march with taps Noodle stomp HS strech with noodle Tandem walking Step up to second step Noodle bicycle   Pt requires the buoyancy of water  for active assisted exercises with buoyancy supported for strengthening and AROM exercises. Hydrostatic  pressure also supports joints by unweighting joint load by at least 50 % in 3-4 feet depth water . 80% in chest to neck deep water . Water  will provide assistance with movement using the current and laminar flow while the buoyancy reduces weight bearing. Pt requires the viscosity of the water  for resistance with strengthening exercises.  [facility] Adult PT Treatment:                                                DATE: 10/03/23 Nustep level 5 UE/LE x 5 minutes for functional activity tolerance Supine PPT x 10 - 5 hold Supine PPT with ball 2x10 Pilates SLR 2x10 ea S/L hip abd x 10 ea Bridge x 10 GTB Repeated lumbar flex with swiss ball in sitting 2x10  Standing hip abd/ext 2x10 ea Assessment of tests/measures, goals, and outcomes  TREATMENT 09/30/23:  Aquatic therapy at [facility] [address] - therapeutic pool temp 92 degrees Pt enters building independently.  Treatment took place in water  3.8 to  4 ft 8 in.feet deep depending upon activity.  Pt entered and exited the pool via stair and handrails    Aquatic Therapy:  Water  walking for warm up fwd/lat/bkwds  L stretch Farmers carry Suitcase carry SL RDL with white barbell SL heel raise Plank on bench Plank on bench with alternating leg/arm lifts (hips flexed, neutral spine) Bil shoulder ext with walk back Lateral walking with band at toes Noodle stomp HS strech with noodle Water  bell balance ext/alt ext FT  Pt requires the buoyancy of water  for active assisted exercises with buoyancy supported for strengthening and AROM exercises. Hydrostatic pressure also supports joints by unweighting  joint load by at least 50 % in 3-4 feet depth water . 80% in chest to neck deep water . Water  will provide assistance with movement using the current and laminar flow while the buoyancy reduces weight bearing. Pt requires the viscosity of the water  for resistance with strengthening exercises.     [facility] Adult PT Treatment:                                                 DATE: 09/26/23 Nustep level 5 UE/LE x 5 minutes for functional activity tolerance Supine PPT x 10 - 5 hold Supine PPT with ball 2x10 Pilates SLR 2x10 ea Repeated lumbar flex with swiss ball in sitting 2x10  Repeated lateral flexion with swiss x 10 ea Lateral walk YTB x 2 laps at counter Standing hip abd/ext x 10 YTB STS 2x10 - no UE   [facility] Adult PT Treatment:                                                DATE: 09/21/23 Nustep level 5 UE/LE x 5 minutes  Standing hip abd/ext 2x10- added YTB at shins Repeated lumbar flex with swiss ball in sitting 2x10  Repeated lateral flexion with swiss x 10 ea Supine PPT with march 2x10, 1 x 10 wth GTB Supine GTB clam with PPT 10 x 2  Supine mini bridge with feet on ball x 8 Hip flexor stretch bilateral x 3  PPT with ball squeeze x 10  PATIENT EDUCATION:  Education details: eval findings, ODI, HEP, POC Person educated: Patient Education method: Explanation, Demonstration, and Handouts Education comprehension: verbalized understanding and returned demonstration  HOME EXERCISE PROGRAM: Access Code: [email or web address] URL: [email or web address] Date: 09/12/2023 Prepared by: Alm Kingdom  Exercises - Modified Debby Stretch  - 1 x daily - 7 x weekly - 2 reps - 30 sec hold - Supine Posterior Pelvic Tilt  - 1 x daily - 7 x weekly - 2 sets - 10 reps - 5 sec hold - Active Straight Leg Raise with Quad Set  - 1 x daily - 7 x weekly - 2 sets - 10 reps - Supine Lower Trunk Rotation  - 1 x daily - 7 x weekly - 10 reps - Hooklying Clamshell with Resistance  - 1 x daily - 7 x weekly - 2-3 sets - 10 reps - green band hold - Standing Hip Abduction with Counter Support  - 1 x daily - 7 x weekly - 2 sets - 10 reps - Standing Hip Extension with Counter Support  - 1 x daily - 7 x weekly - 2 sets - 10 reps  ASSESSMENT:  CLINICAL IMPRESSION: Session today focused on hip and core strengthening in the aquatic environment for use of buoyancy  to offload joints and the viscosity of water  as resistance during therapeutic exercise.  Focused more on global aerobic activity today with addition of water  jogging and jumping jacks.  Pt reports mild/mod fatigue and no increase in pain  Patient was able to tolerate all prescribed exercises in the aquatic environment with no adverse effects and reports 1/10 pain at the end of the session. Patient continues to benefit from skilled PT services on land and aquatic based and should  be progressed as able to improve functional independence.    EVAL: Patient is a 68 y.o. F who was seen today for physical therapy evaluation and treatment for chronic LBP with referral into bilateral LE R>L. Physical findings are consistent with MD impression as pt demonstrates decrease in core and proximal hip strength and functional mobility. ODI score shows moderate disability in performance of home ADLs and community activities. Pt would benefit from skilled PT services working on improving core and proximal hip strength in order to decrease pain in gym and aquatic environments.   OBJECTIVE IMPAIRMENTS: Abnormal gait, decreased activity tolerance, decreased mobility, difficulty walking, decreased ROM, decreased strength, postural dysfunction, and pain   ACTIVITY LIMITATIONS: carrying, lifting, standing, squatting, stairs, transfers, and locomotion level  PARTICIPATION LIMITATIONS: meal prep, cleaning, driving, shopping, community activity, occupation, and yard work  PERSONAL FACTORS: Time since onset of injury/illness/exacerbation and 1-2 comorbidities: HTN are also affecting patient's functional outcome.   REHAB POTENTIAL: Good  CLINICAL DECISION MAKING: Evolving/moderate complexity  EVALUATION COMPLEXITY: Moderate   GOALS: Goals reviewed with patient? No  SHORT TERM GOALS: Target date: 09/29/2023   Pt will be compliant and knowledgeable with initial HEP for improved comfort and carryover Baseline: initial HEP  given  Goal status: MET  2.  Pt will self report low back pain no greater than 5/10 for improved comfort and functional ability Baseline: 7/10 at worst 10/03/2023: 2/10 at worst Goal status: MET   LONG TERM GOALS: Target date: 11/03/2023   Pt will be decrease ODI disability score to no greater than 25% (12/50) as proxy for functional improvement Baseline: 36% disability - 18/50  10/03/2023: 28% disability - 14/50 Goal status: IN PROGRESS  2.  Pt will self report low back pain no greater than 3/10 for improved comfort and functional ability Baseline: 7/10 at worst Goal status: INITIAL   3.  Pt will increase 30 Second Sit to Stand rep count to no less than 14 reps for improved balance, strength, and functional mobility Baseline: 12 reps with UE 10/03/2023: 13 reps no UE Goal status: IN PROGRESS   4.  Pt will improve standing and walking activity tolerance to no less than 30-45 minutes for improved comfort and functional ability with community activities  Baseline: 15 minutes Goal status: INITIAL   PLAN:  PT FREQUENCY: 2x/week  PT DURATION: 8 weeks  PLANNED INTERVENTIONS: [id number]- PT Re-evaluation, 97110-Therapeutic exercises, 97530- Therapeutic activity, [id number]- Neuromuscular re-education, 97535- Self Care, [id number]- Manual therapy, [id number]- Gait training, [phone]- Aquatic Therapy, [id number]- Electrical stimulation (unattended), [id number]- Electrical stimulation (manual), 20560 (1-2 muscles), 20561 (3+ muscles)- Dry Needling, Cryotherapy, and Moist heat  PLAN FOR NEXT SESSION: assess HEP response, neutral spine core strengthening, hip flexor stretching   Date of referral: 08/24/2023 Referring provider: Burnetta Aures, MD Referring diagnosis? M54.50 (ICD-10-CM) - Low back pain, unspecified  Treatment diagnosis? (if different than referring diagnosis) Other low back pain Muscle weakness (generalized) Other abnormalities of gait and mobility  What was this (referring dx) caused by?  Arthritis  Nature of Condition: Chronic (continuous duration > 3 months)   Laterality: Both  Current Functional Measure Score:  ODI: 18/50 - 36% disability  Objective measurements identify impairments when they are compared to normal values, the uninvolved extremity, and prior level of function.  [x]  Yes  []  No  Objective assessment of functional ability: Moderate functional limitations   Briefly describe symptoms:  Pt presents to PT with reports of chronic lower back pain with referral into LE R>L. Notes pain  increases with prolonged standing and walking. Some N/T in bilateral LE also R>L. Does aquatic exercise and notes this seem sto help her discomfort some too. Denies bowel/bladder changes or saddle anesthesia.   How did symptoms start: Chronic onset with none trauma, slowly increasing  Average pain intensity:  Last 24 hours: 2/10  Past week: 7/10  How often does the pt experience symptoms? Frequently  How much have the symptoms interfered with usual daily activities? Moderately  How has condition changed since care began at this facility? NA - initial visit  In general, how is the patients overall health? Very Good   BACK PAIN (STarT Back Screening Tool) No   Helene BRAVO Adolf [place] PT 10/14/23 8:18 AM

## 2023-10-20 ENCOUNTER — Ambulatory Visit (INDEPENDENT_AMBULATORY_CARE_PROVIDER_SITE_OTHER): Admitting: Internal Medicine

## 2023-10-21 ENCOUNTER — Ambulatory Visit: Attending: Orthopedic Surgery

## 2023-10-21 DIAGNOSIS — R2689 Other abnormalities of gait and mobility: Secondary | ICD-10-CM | POA: Insufficient documentation

## 2023-10-21 DIAGNOSIS — M5459 Other low back pain: Secondary | ICD-10-CM | POA: Insufficient documentation

## 2023-10-21 DIAGNOSIS — M6281 Muscle weakness (generalized): Secondary | ICD-10-CM | POA: Diagnosis not present

## 2023-10-21 NOTE — Therapy (Signed)
 OUTPATIENT PHYSICAL THERAPY TREATMENT   Patient Name: Ashley York MRN: [id number] DOB:06-24-1955, 68 y.o., female Today's Date: 10/21/2023  END OF SESSION:  PT End of Session - 10/21/23 1527     Visit Number 10    Number of Visits 17    Date for PT Re-Evaluation 11/03/23    Authorization Type [employer] MCR    Authorization Time Period Submitted for 12 additional viists    PT Start Time 1530    PT Stop Time 1610    PT Time Calculation (min) 40 min    Activity Tolerance Patient tolerated treatment well    Behavior During Therapy [facility] for tasks assessed/performed         Past Medical History:  Diagnosis Date   Allergy    Back pain    CHOLELITHIASIS 05/10/2008   Qualifier: Diagnosis of  By: Tivis RN, Channing     CONSTIPATION 04/23/2008   Qualifier: Diagnosis of  By: Abran MD, Norleen SAILOR    Dyspnea on exertion    Gallstones    GERD (gastroesophageal reflux disease)    Heart murmur    HEMORRHOIDS, INTERNAL 04/19/2008   Qualifier: Diagnosis of  By: Claudene GUSS Railing     Hyperlipidemia    Hypertension    HYPERTENSION 04/19/2008   Qualifier: Diagnosis of  By: Claudene GUSS Railing     Hypothyroidism    HYPOTHYROIDISM 04/19/2008   Qualifier: Diagnosis of  By: Claudene GUSS Railing     Joint pain    Osteoarthritis    Overweight    Pre-diabetes    Sleep apnea    cpap   SOB (shortness of breath)    Spondylosis    Vitamin B 12 deficiency    Vitamin D  deficiency    Past Surgical History:  Procedure Laterality Date   CESAREAN SECTION  1983, 1984   COLONOSCOPY     KNEE SURGERY Right 03/22/2008   TOTAL KNEE ARTHROPLASTY Left 12/01/2020   Procedure: TOTAL KNEE ARTHROPLASTY;  Surgeon: Melodi Lerner, MD;  Location: [facility];  Service: Orthopedics;  Laterality: Left;    WISDOM TOOTH EXTRACTION  03/22/1997   Patient Active Problem List   Diagnosis Date Noted   Prediabetes 09/19/2023   OSA (obstructive sleep apnea) - mild on CPAP 09/19/2023   Pure hypercholesterolemia  09/19/2023   Anemia 09/19/2023   Daytime somnolence 09/19/2023   Vitamin D  deficiency 09/19/2023   OA (osteoarthritis) of knee 12/01/2020   Dyspnea on exertion 11/17/2017   CHOLELITHIASIS 05/10/2008   CONSTIPATION 04/23/2008   Hypothyroidism 04/19/2008   Essential hypertension 04/19/2008   HEMORRHOIDS, INTERNAL 04/19/2008    PCP: Rexanne Ingle, MD  REFERRING PROVIDER: Burnetta Aures, MD  REFERRING DIAG: M54.50 (ICD-10-CM) - Low back pain, unspecified   Rationale for Evaluation and Treatment: Rehabilitation  THERAPY DIAG:  Other low back pain  Muscle weakness (generalized)  Other abnormalities of gait and mobility  ONSET DATE: Chronic  SUBJECTIVE:  SUBJECTIVE STATEMENT: Pt reports she is doing well with low pain levels today.  EVAL: Pt presents to PT with reports of chronic lower back pain with referral into LE R>L. Notes pain increases with prolonged standing and walking. Some N/T in bilateral LE also R>L. Does aquatic exercise and notes this seem sto help her discomfort some too. Denies bowel/bladder changes or saddle anesthesia.   PERTINENT HISTORY:  HTN: also bilat knee replacements   PAIN:  Are you having pain?  Yes: NPRS scale: 2/10 Worst: 7/10 Pain location: lower back,R LE Pain description: Tired Aggravating factors: standing, walking Relieving factors: rest, medication  PRECAUTIONS: Spondylolisthesis   RED FLAGS: None   WEIGHT BEARING RESTRICTIONS: No  FALLS:  Has patient fallen in last 6 months? No  LIVING ENVIRONMENT: Lives with: lives alone Lives in: House/apartment Stairs: No Has following equipment at home: Single point cane  OCCUPATION: Retired  PLOF: Independent  PATIENT GOALS: Pt wants to decrease back pain and improve comfort with standing and  walking  OBJECTIVE:  Note: Objective measures were completed at Evaluation unless otherwise noted.  DIAGNOSTIC FINDINGS: See imaging  PATIENT SURVEYS:  ODI: 18/50 - 36% disability  COGNITION: Overall cognitive status: Within functional limits for tasks assessed     SENSATION: [facility]  MUSCLE LENGTH: Thomas test: Right (+); Left (+)  POSTURE: rounded shoulders, forward head, and increased lumbar lordosis  PALPATION: TTP to lumbar paraspinals and R gluteals   LUMBAR ROM:   AROM eval  Flexion 50%  Extension 25%  Right lateral flexion   Left lateral flexion   Right rotation   Left rotation    (Blank rows = not tested)  LOWER EXTREMITY MMT:    MMT Right eval Left eval  Hip flexion 3+ 3+  Hip extension    Hip abduction 3+ 3+  Hip adduction    Hip internal rotation    Hip external rotation    Knee flexion 3+ 3+  Knee extension 3+ 3+  Ankle dorsiflexion    Ankle plantarflexion    Ankle inversion    Ankle eversion     (Blank rows = not tested)  LUMBAR SPECIAL TESTS:  Straight leg raise test: Negative and Slump test: Negative  FUNCTIONAL TESTS:  30 Second Sit to Stand: 12 reps with UE  GAIT: Distance walked: 7ft Assistive device utilized: None Level of assistance: Complete Independence Comments: flexed trunk  TREATMENT: [facility] Adult PT Treatment:                                                DATE: 10/21/23 Aquatic therapy at [facility]- [address] - therapeutic pool temp 92 degrees Pt enters building independently.  Treatment took place in water  3.8 to  4 ft 8 in.feet deep depending upon activity.  Pt entered and exited the pool via stair and handrails    Aquatic Therapy: Water  walking for warm up fwd/lat/bkwds Farmers carry yellow DB Suitcase carry yellow DB Walking marches yellow DB by sides x2 laps L stretch 5 hold x10 SL RDL with white barbell to pool wall x10 BIL Deep water  jogging x3' Deep water  jumping jacks x3' Noodle stomp x20  BIL Water  bell - shoulder flex/ext - narrow bos Alternating  Walking lunges Tandem walking Reverse Step ups x10 BIL HS stretch at step   Pt requires the buoyancy of water  for active assisted  exercises with buoyancy supported for strengthening and AROM exercises. Hydrostatic pressure also supports joints by unweighting joint load by at least 50 % in 3-4 feet depth water . 80% in chest to neck deep water . Water  will provide assistance with movement using the current and laminar flow while the buoyancy reduces weight bearing. Pt requires the viscosity of the water  for resistance with strengthening exercises.   TREATMENT 10/13/23:  Aquatic therapy at [facility]- [address] - therapeutic pool temp 92 degrees Pt enters building independently.  Treatment took place in water  3.8 to  4 ft 8 in.feet deep depending upon activity.  Pt entered and exited the pool via stair and handrails    Aquatic Therapy:  Water  walking for warm up fwd/lat/bkwds  L stretch Farmers carry Suitcase carry SL RDL with white barbell to pool wall Step up with march Deep water  jogging Deep water  jumping jack HS stretch at step Slow march with water  bells Noodle stomp Water  bell - shoulder flex/ext - narrow bos Alternating  Tandem walking reverse Step up fwd and lat with march Walking lunges -    Pt requires the buoyancy of water  for active assisted exercises with buoyancy supported for strengthening and AROM exercises. Hydrostatic pressure also supports joints by unweighting joint load by at least 50 % in 3-4 feet depth water . 80% in chest to neck deep water . Water  will provide assistance with movement using the current and laminar flow while the buoyancy reduces weight bearing. Pt requires the viscosity of the water  for resistance with strengthening exercises.  [facility] Adult PT Treatment:                                                DATE: 10/10/23 Therapeutic Exercise Nustep level 5 UE/LE x 4 minutes for  functional activity tolerance Supine PPT with ball 1x10 disc. d/t cramping bilateral hamstrings  Pilates SLR 1x10 ea S/L hip abd x 8 ea Bridge 1x10 - disc. d/t cramping bilateral hamstrings  Supine hamstring stretch 30 sec ea Repeated lumbar flex with swiss ball in sitting 2x10  Standing hip abd/ext 1x10 ea Therapeutic Activity STS table 2x10 - 1 set high 1 low Standing hip flexion to step - 3# ankle weight 2x10 each  Step ups 4in x10 ea Lateral Step ups 4in 10 ea      PATIENT EDUCATION:  Education details: eval findings, ODI, HEP, POC Person educated: Patient Education method: Explanation, Demonstration, and Handouts Education comprehension: verbalized understanding and returned demonstration  HOME EXERCISE PROGRAM: Access Code: [email or web address] URL: [email or web address] Date: 09/12/2023 Prepared by: Alm Kingdom  Exercises - Modified Debby Stretch  - 1 x daily - 7 x weekly - 2 reps - 30 sec hold - Supine Posterior Pelvic Tilt  - 1 x daily - 7 x weekly - 2 sets - 10 reps - 5 sec hold - Active Straight Leg Raise with Quad Set  - 1 x daily - 7 x weekly - 2 sets - 10 reps - Supine Lower Trunk Rotation  - 1 x daily - 7 x weekly - 10 reps - Hooklying Clamshell with Resistance  - 1 x daily - 7 x weekly - 2-3 sets - 10 reps - green band hold - Standing Hip Abduction with Counter Support  - 1 x daily - 7 x weekly - 2 sets - 10 reps - Standing Hip Extension with  Counter Support  - 1 x daily - 7 x weekly - 2 sets - 10 reps  ASSESSMENT:  CLINICAL IMPRESSION: Patient arrives to aquatic PT reporting 3/10 lower back pain. She has been compliant with her HEP and also goes to the [facility] a few days a week. Session today focused on LE strengthening, balance tasks, and stretching in the aquatic environment for use of buoyancy to offload joints and the viscosity of water  as resistance during therapeutic exercise. Patient was able to tolerate all prescribed exercises in the aquatic  environment with no adverse effects. She reports that so far PT has been beneficial and has noticed an overall decrease in her pain. She is making progress towards all of her LTG at this time and continues to benefit from skilled PT services on land and aquatic based and should be progressed as able to improve functional independence.    EVAL: Patient is a 68 y.o. F who was seen today for physical therapy evaluation and treatment for chronic LBP with referral into bilateral LE R>L. Physical findings are consistent with MD impression as pt demonstrates decrease in core and proximal hip strength and functional mobility. ODI score shows moderate disability in performance of home ADLs and community activities. Pt would benefit from skilled PT services working on improving core and proximal hip strength in order to decrease pain in gym and aquatic environments.   OBJECTIVE IMPAIRMENTS: Abnormal gait, decreased activity tolerance, decreased mobility, difficulty walking, decreased ROM, decreased strength, postural dysfunction, and pain   ACTIVITY LIMITATIONS: carrying, lifting, standing, squatting, stairs, transfers, and locomotion level  PARTICIPATION LIMITATIONS: meal prep, cleaning, driving, shopping, community activity, occupation, and yard work  PERSONAL FACTORS: Time since onset of injury/illness/exacerbation and 1-2 comorbidities: HTN are also affecting patient's functional outcome.   REHAB POTENTIAL: Good  CLINICAL DECISION MAKING: Evolving/moderate complexity  EVALUATION COMPLEXITY: Moderate   GOALS: Goals reviewed with patient? No  SHORT TERM GOALS: Target date: 09/29/2023   Pt will be compliant and knowledgeable with initial HEP for improved comfort and carryover Baseline: initial HEP given  Goal status: MET  2.  Pt will self report low back pain no greater than 5/10 for improved comfort and functional ability Baseline: 7/10 at worst 10/03/2023: 2/10 at worst Goal status: MET    LONG TERM GOALS: Target date: 11/03/2023   Pt will be decrease ODI disability score to no greater than 25% (12/50) as proxy for functional improvement Baseline: 36% disability - 18/50  10/03/2023: 28% disability - 14/50 Goal status: IN PROGRESS  2.  Pt will self report low back pain no greater than 3/10 for improved comfort and functional ability Baseline: 7/10 at worst Goal status: Progressing 10/21/23: 5/10 at highest   3.  Pt will increase 30 Second Sit to Stand rep count to no less than 14 reps for improved balance, strength, and functional mobility Baseline: 12 reps with UE 10/03/2023: 13 reps no UE Goal status: Ongoing   4.  Pt will improve standing and walking activity tolerance to no less than 30-45 minutes for improved comfort and functional ability with community activities  Baseline: 15 minutes Goal status: Ongoing   PLAN:  PT FREQUENCY: 2x/week  PT DURATION: 8 weeks  PLANNED INTERVENTIONS: [id number]- PT Re-evaluation, 97110-Therapeutic exercises, 97530- Therapeutic activity, [id number]- Neuromuscular re-education, 97535- Self Care, [id number]- Manual therapy, [id number]- Gait training, [phone]- Aquatic Therapy, [phone]- Electrical stimulation (unattended), [id number]- Electrical stimulation (manual), 20560 (1-2 muscles), [id number] (3+ muscles)- Dry Needling, Cryotherapy, and Moist heat  PLAN FOR NEXT  SESSION: assess HEP response, neutral spine core strengthening, hip flexor stretching    Corean Pouch PTA 10/21/23 4:11 PM

## 2023-10-27 ENCOUNTER — Ambulatory Visit

## 2023-10-27 NOTE — Therapy (Incomplete)
 OUTPATIENT PHYSICAL THERAPY TREATMENT   Patient Name: Ashley York MRN: [id number] DOB:September 24, 1955, 68 y.o., female Today's Date: 10/27/2023  END OF SESSION:   Past Medical History:  Diagnosis Date   Allergy    Back pain    CHOLELITHIASIS 05/10/2008   Qualifier: Diagnosis of  By: Tivis RN, Cheryl     CONSTIPATION 04/23/2008   Qualifier: Diagnosis of  By: Abran MD, Norleen SAILOR    Dyspnea on exertion    Gallstones    GERD (gastroesophageal reflux disease)    Heart murmur    HEMORRHOIDS, INTERNAL 04/19/2008   Qualifier: Diagnosis of  By: Claudene GUSS Railing     Hyperlipidemia    Hypertension    HYPERTENSION 04/19/2008   Qualifier: Diagnosis of  By: Claudene GUSS Railing     Hypothyroidism    HYPOTHYROIDISM 04/19/2008   Qualifier: Diagnosis of  By: Claudene GUSS Railing     Joint pain    Osteoarthritis    Overweight    Pre-diabetes    Sleep apnea    cpap   SOB (shortness of breath)    Spondylosis    Vitamin B 12 deficiency    Vitamin D  deficiency    Past Surgical History:  Procedure Laterality Date   CESAREAN SECTION  1983, 1984   COLONOSCOPY     KNEE SURGERY Right 03/22/2008   TOTAL KNEE ARTHROPLASTY Left 12/01/2020   Procedure: TOTAL KNEE ARTHROPLASTY;  Surgeon: Melodi Lerner, MD;  Location: [facility];  Service: Orthopedics;  Laterality: Left;    WISDOM TOOTH EXTRACTION  03/22/1997   Patient Active Problem List   Diagnosis Date Noted   Prediabetes 09/19/2023   OSA (obstructive sleep apnea) - mild on CPAP 09/19/2023   Pure hypercholesterolemia 09/19/2023   Anemia 09/19/2023   Daytime somnolence 09/19/2023   Vitamin D  deficiency 09/19/2023   OA (osteoarthritis) of knee 12/01/2020   Dyspnea on exertion 11/17/2017   CHOLELITHIASIS 05/10/2008   CONSTIPATION 04/23/2008   Hypothyroidism 04/19/2008   Essential hypertension 04/19/2008   HEMORRHOIDS, INTERNAL 04/19/2008    PCP: Rexanne Ingle, MD  REFERRING PROVIDER: Burnetta Aures, MD  REFERRING DIAG: M54.50  (ICD-10-CM) - Low back pain, unspecified   Rationale for Evaluation and Treatment: Rehabilitation  THERAPY DIAG:  No diagnosis found.  ONSET DATE: Chronic  SUBJECTIVE:                                                                                                                                                                                           SUBJECTIVE STATEMENT: ***  Pt reports she is doing well with low pain levels today.  EVAL: Pt presents  to PT with reports of chronic lower back pain with referral into LE R>L. Notes pain increases with prolonged standing and walking. Some N/T in bilateral LE also R>L. Does aquatic exercise and notes this seem sto help her discomfort some too. Denies bowel/bladder changes or saddle anesthesia.   PERTINENT HISTORY:  HTN: also bilat knee replacements   PAIN:  Are you having pain?  Yes: NPRS scale: 2/10 Worst: 7/10 Pain location: lower back,R LE Pain description: Tired Aggravating factors: standing, walking Relieving factors: rest, medication  PRECAUTIONS: Spondylolisthesis   RED FLAGS: None   WEIGHT BEARING RESTRICTIONS: No  FALLS:  Has patient fallen in last 6 months? No  LIVING ENVIRONMENT: Lives with: lives alone Lives in: House/apartment Stairs: No Has following equipment at home: Single point cane  OCCUPATION: Retired  PLOF: Independent  PATIENT GOALS: Pt wants to decrease back pain and improve comfort with standing and walking  OBJECTIVE:  Note: Objective measures were completed at Evaluation unless otherwise noted.  DIAGNOSTIC FINDINGS: See imaging  PATIENT SURVEYS:  ODI: 18/50 - 36% disability  COGNITION: Overall cognitive status: Within functional limits for tasks assessed     SENSATION: [facility]  MUSCLE LENGTH: Thomas test: Right (+); Left (+)  POSTURE: rounded shoulders, forward head, and increased lumbar lordosis  PALPATION: TTP to lumbar paraspinals and R gluteals   LUMBAR ROM:   AROM  eval  Flexion 50%  Extension 25%  Right lateral flexion   Left lateral flexion   Right rotation   Left rotation    (Blank rows = not tested)  LOWER EXTREMITY MMT:    MMT Right eval Left eval  Hip flexion 3+ 3+  Hip extension    Hip abduction 3+ 3+  Hip adduction    Hip internal rotation    Hip external rotation    Knee flexion 3+ 3+  Knee extension 3+ 3+  Ankle dorsiflexion    Ankle plantarflexion    Ankle inversion    Ankle eversion     (Blank rows = not tested)  LUMBAR SPECIAL TESTS:  Straight leg raise test: Negative and Slump test: Negative  FUNCTIONAL TESTS:  30 Second Sit to Stand: 12 reps with UE  GAIT: Distance walked: 54ft Assistive device utilized: None Level of assistance: Complete Independence Comments: flexed trunk  TREATMENT: [facility] Adult PT Treatment:                                                DATE: 10/27/23 Aquatic therapy at [facility]- [address] - therapeutic pool temp 92 degrees Pt enters building independently.  Treatment took place in water  3.8 to  4 ft 8 in.feet deep depending upon activity.  Pt entered and exited the pool via stair and handrails    Aquatic Therapy: Water  walking for warm up fwd/lat/bkwds Farmers carry yellow DB Suitcase carry yellow DB Walking marches yellow DB by sides x2 laps L stretch 5 hold x10 SL RDL with white barbell to pool wall x10 BIL Deep water  jogging x3' Deep water  jumping jacks x3' Noodle stomp x20 BIL Water  bell - shoulder flex/ext - narrow bos Alternating  Walking lunges Tandem walking Reverse Step ups x10 BIL HS stretch at step  Pt requires the buoyancy of water  for active assisted exercises with buoyancy supported for strengthening and AROM exercises. Hydrostatic pressure also supports joints by unweighting joint load by  at least 50 % in 3-4 feet depth water . 80% in chest to neck deep water . Water  will provide assistance with movement using the current and laminar flow while the buoyancy  reduces weight bearing. Pt requires the viscosity of the water  for resistance with strengthening exercises.  [facility] Adult PT Treatment:                                                DATE: 10/21/23 Aquatic therapy at [facility]- [address] - therapeutic pool temp 92 degrees Pt enters building independently.  Treatment took place in water  3.8 to  4 ft 8 in.feet deep depending upon activity.  Pt entered and exited the pool via stair and handrails    Aquatic Therapy: Water  walking for warm up fwd/lat/bkwds Farmers carry yellow DB Suitcase carry yellow DB Walking marches yellow DB by sides x2 laps L stretch 5 hold x10 SL RDL with white barbell to pool wall x10 BIL Deep water  jogging x3' Deep water  jumping jacks x3' Noodle stomp x20 BIL Water  bell - shoulder flex/ext - narrow bos Alternating  Walking lunges Tandem walking Reverse Step ups x10 BIL HS stretch at step   Pt requires the buoyancy of water  for active assisted exercises with buoyancy supported for strengthening and AROM exercises. Hydrostatic pressure also supports joints by unweighting joint load by at least 50 % in 3-4 feet depth water . 80% in chest to neck deep water . Water  will provide assistance with movement using the current and laminar flow while the buoyancy reduces weight bearing. Pt requires the viscosity of the water  for resistance with strengthening exercises.   TREATMENT 10/13/23:  Aquatic therapy at [facility]- [address] - therapeutic pool temp 92 degrees Pt enters building independently.  Treatment took place in water  3.8 to  4 ft 8 in.feet deep depending upon activity.  Pt entered and exited the pool via stair and handrails    Aquatic Therapy:  Water  walking for warm up fwd/lat/bkwds  L stretch Farmers carry Suitcase carry SL RDL with white barbell to pool wall Step up with march Deep water  jogging Deep water  jumping jack HS stretch at step Slow march with water  bells Noodle stomp Water   bell - shoulder flex/ext - narrow bos Alternating  Tandem walking reverse Step up fwd and lat with march Walking lunges -    Pt requires the buoyancy of water  for active assisted exercises with buoyancy supported for strengthening and AROM exercises. Hydrostatic pressure also supports joints by unweighting joint load by at least 50 % in 3-4 feet depth water . 80% in chest to neck deep water . Water  will provide assistance with movement using the current and laminar flow while the buoyancy reduces weight bearing. Pt requires the viscosity of the water  for resistance with strengthening exercises.   PATIENT EDUCATION:  Education details: eval findings, ODI, HEP, POC Person educated: Patient Education method: Explanation, Demonstration, and Handouts Education comprehension: verbalized understanding and returned demonstration  HOME EXERCISE PROGRAM: Access Code: [email or web address] URL: [email or web address] Date: 09/12/2023 Prepared by: Alm Kingdom  Exercises - Modified Debby Stretch  - 1 x daily - 7 x weekly - 2 reps - 30 sec hold - Supine Posterior Pelvic Tilt  - 1 x daily - 7 x weekly - 2 sets - 10 reps - 5 sec hold - Active Straight Leg Raise with Quad Set  - 1  x daily - 7 x weekly - 2 sets - 10 reps - Supine Lower Trunk Rotation  - 1 x daily - 7 x weekly - 10 reps - Hooklying Clamshell with Resistance  - 1 x daily - 7 x weekly - 2-3 sets - 10 reps - green band hold - Standing Hip Abduction with Counter Support  - 1 x daily - 7 x weekly - 2 sets - 10 reps - Standing Hip Extension with Counter Support  - 1 x daily - 7 x weekly - 2 sets - 10 reps  ASSESSMENT:  CLINICAL IMPRESSION: ***  Patient arrives to aquatic PT reporting 3/10 lower back pain. She has been compliant with her HEP and also goes to the [facility] a few days a week. Session today focused on LE strengthening, balance tasks, and stretching in the aquatic environment for use of buoyancy to offload joints and the viscosity  of water  as resistance during therapeutic exercise. Patient was able to tolerate all prescribed exercises in the aquatic environment with no adverse effects. She reports that so far PT has been beneficial and has noticed an overall decrease in her pain. She is making progress towards all of her LTG at this time and continues to benefit from skilled PT services on land and aquatic based and should be progressed as able to improve functional independence.    EVAL: Patient is a 68 y.o. F who was seen today for physical therapy evaluation and treatment for chronic LBP with referral into bilateral LE R>L. Physical findings are consistent with MD impression as pt demonstrates decrease in core and proximal hip strength and functional mobility. ODI score shows moderate disability in performance of home ADLs and community activities. Pt would benefit from skilled PT services working on improving core and proximal hip strength in order to decrease pain in gym and aquatic environments.   OBJECTIVE IMPAIRMENTS: Abnormal gait, decreased activity tolerance, decreased mobility, difficulty walking, decreased ROM, decreased strength, postural dysfunction, and pain   ACTIVITY LIMITATIONS: carrying, lifting, standing, squatting, stairs, transfers, and locomotion level  PARTICIPATION LIMITATIONS: meal prep, cleaning, driving, shopping, community activity, occupation, and yard work  PERSONAL FACTORS: Time since onset of injury/illness/exacerbation and 1-2 comorbidities: HTN are also affecting patient's functional outcome.   REHAB POTENTIAL: Good  CLINICAL DECISION MAKING: Evolving/moderate complexity  EVALUATION COMPLEXITY: Moderate   GOALS: Goals reviewed with patient? No  SHORT TERM GOALS: Target date: 09/29/2023   Pt will be compliant and knowledgeable with initial HEP for improved comfort and carryover Baseline: initial HEP given  Goal status: MET  2.  Pt will self report low back pain no greater than  5/10 for improved comfort and functional ability Baseline: 7/10 at worst 10/03/2023: 2/10 at worst Goal status: MET   LONG TERM GOALS: Target date: 11/03/2023   Pt will be decrease ODI disability score to no greater than 25% (12/50) as proxy for functional improvement Baseline: 36% disability - 18/50  10/03/2023: 28% disability - 14/50 Goal status: IN PROGRESS  2.  Pt will self report low back pain no greater than 3/10 for improved comfort and functional ability Baseline: 7/10 at worst Goal status: Progressing 10/21/23: 5/10 at highest   3.  Pt will increase 30 Second Sit to Stand rep count to no less than 14 reps for improved balance, strength, and functional mobility Baseline: 12 reps with UE 10/03/2023: 13 reps no UE Goal status: Ongoing   4.  Pt will improve standing and walking activity tolerance to no  less than 30-45 minutes for improved comfort and functional ability with community activities  Baseline: 15 minutes Goal status: Ongoing   PLAN:  PT FREQUENCY: 2x/week  PT DURATION: 8 weeks  PLANNED INTERVENTIONS: [id number]- PT Re-evaluation, 97110-Therapeutic exercises, 97530- Therapeutic activity, 97112- Neuromuscular re-education, 97535- Self Care, [id number]- Manual therapy, [id number]- Gait training, [phone]- Aquatic Therapy, [phone]- Electrical stimulation (unattended), [phone]- Electrical stimulation (manual), 20560 (1-2 muscles), 20561 (3+ muscles)- Dry Needling, Cryotherapy, and Moist heat  PLAN FOR NEXT SESSION: assess HEP response, neutral spine core strengthening, hip flexor stretching    Corean Pouch PTA 10/27/23 7:36 AM

## 2023-11-02 ENCOUNTER — Ambulatory Visit (INDEPENDENT_AMBULATORY_CARE_PROVIDER_SITE_OTHER): Admitting: Internal Medicine

## 2023-11-02 ENCOUNTER — Encounter (INDEPENDENT_AMBULATORY_CARE_PROVIDER_SITE_OTHER): Payer: Self-pay | Admitting: Internal Medicine

## 2023-11-02 VITALS — BP 138/79 | HR 71 | Temp 98.7°F | Ht 65.5 in | Wt 258.0 lb

## 2023-11-02 DIAGNOSIS — G4733 Obstructive sleep apnea (adult) (pediatric): Secondary | ICD-10-CM | POA: Diagnosis not present

## 2023-11-02 DIAGNOSIS — E7800 Pure hypercholesterolemia, unspecified: Secondary | ICD-10-CM | POA: Diagnosis not present

## 2023-11-02 DIAGNOSIS — E66813 Obesity, class 3: Secondary | ICD-10-CM

## 2023-11-02 DIAGNOSIS — I10 Essential (primary) hypertension: Secondary | ICD-10-CM

## 2023-11-02 DIAGNOSIS — R7303 Prediabetes: Secondary | ICD-10-CM | POA: Diagnosis not present

## 2023-11-02 DIAGNOSIS — Z6841 Body Mass Index (BMI) 40.0 and over, adult: Secondary | ICD-10-CM

## 2023-11-02 NOTE — Progress Notes (Signed)
 Office: [phone]  /  Fax: [phone]  Weight Summary and Body Composition Analysis (BIA)  Vitals Temp: 98.7 F (37.1 C) BP: 138/79 Pulse Rate: 71 SpO2: 98 %   Anthropometric Measurements Height: 5' 5.5 (1.664 m) Weight: 258 lb (117 kg) BMI (Calculated): 42.27 Weight at Last Visit: 263 lb Weight Lost Since Last Visit: 5 lb Weight Gained Since Last Visit: 0 lb Starting Weight: 263 lb Total Weight Loss (lbs): 5 lb (2.268 kg) Peak Weight: 270 lb   Body Composition  Body Fat %: 52.7 % Fat Mass (lbs): 136.4 lbs Muscle Mass (lbs): 116.2 lbs Visceral Fat Rating : 18    RMR: 1627  Today's Visit #: 3  Starting Date: 09/19/23   Subjective   Chief Complaint: Obesity  Interval History Discussed the use of AI scribe software for clinical note transcription with the patient, who gave verbal consent to proceed.  History of Present Illness   Ashley York is a 68 year old female who presents for medical weight management.  She has lost five pounds since her last visit and follows a 1200 calorie nutrition plan, although she does not track her intake 50% of the time. She incorporates more whole foods and protein into her diet but occasionally skips meals.  She engages in water  aerobics three days a week for about 60 minutes and is conscious of her daily steps, aiming for at least 5000 steps a day. She achieved 4000 steps yesterday and then walked around her house to reach her goal of 5000 steps.  She enjoys cooking and uses healthy recipes, including breakfast quiche and oatmeal cookies, which she shares with her children to avoid overeating. She also made an almond cake with healthier ingredients.  She experiences 'food noise' and frequently thinks about food, although she feels satisfied after meals. She is trying to manage her appetite and has previously used Ozempic, which helped reduce her preoccupation with food.  She has multiple unexpired pens and is  requesting guidance on the use of medication.  She has mild sleep apnea and uses a CPAP machine every night. Her last sleep study was conducted a long time ago at [facility].       Challenges affecting patient progress: strong hunger signals and/or impaired satiety / inhibitory control and menopause.    Pharmacotherapy for weight management: She is currently taking no anti-obesity medication and states interest in starting a medication to aid with weight loss citing difficulty with maintaining a reduced calorie state and weight loss.   Assessment and Plan   Treatment Plan For Obesity:  Recommended Dietary Goals  Ashley York is currently in the action stage of change. As such, her goal is to continue weight management plan. She has agreed to: continue current plan  Behavioral Health and Counseling  We discussed the following behavioral modification strategies today: continue to work on maintaining a reduced calorie state, getting the recommended amount of protein, incorporating whole foods, making healthy choices, staying well hydrated and practicing mindfulness when eating..  Additional education and resources provided today: None  Recommended Physical Activity Goals  Ashley York has been advised to work up to 150 minutes of moderate intensity aerobic activity a week and strengthening exercises 2-3 times per week for cardiovascular health, weight loss maintenance and preservation of muscle mass.   She has agreed to :  continue to gradually increase the amount and intensity of exercise routine  Medical Interventions and Pharmacotherapy  We discussed various medication options to help Ashley York with  her weight loss efforts and we both agreed to : She has multiple unexpired Ozempic pens in her possession and insist that she will like to use medication to aid in her weight loss process.  We discussed the limitations with this approach as these medications need to be used long-term to sustain  weight loss and also cardiometabolic benefits.  She will be starting semaglutide 0.5 mg once a week.  She did not tolerate metformin in the past  Associated Conditions Impacted by Obesity Treatment  Assessment & Plan Essential hypertension Blood pressure is above goal.  On amlodipine  and ARB hydrochlorothiazide  without adverse effects.    Most recent renal parameters reviewed and within normal limits.  Losing 10% of body weight may improve blood pressure control.  Continue current regimen  She will be starting semaglutide 0.5 mg once a week which may aid in blood pressure control.  Pure hypercholesterolemia LDL is not at goal. Elevated LDL may be secondary to nutrition, genetics and spillover effect from excess adiposity. Recommended LDL goal is <70 to reduce the risk of fatty streaks and the progression to obstructive ASCVD in the future.   Her 10 year risk is: The 10-year ASCVD risk score (Arnett DK, et al., 2019) is: 10.7%  Lab Results  Component Value Date   CHOL 169 09/19/2023   HDL 71 09/19/2023   LDLCALC 83 09/19/2023   TRIG 83 09/19/2023    Continue weight loss therapy, losing 10% or more of body weight may improve condition.  May benefit from increasing statin intensity for an LDL goal of less than 70 consider intermediate cardiovascular risk    Class 3 severe obesity with serious comorbidity and body mass index (BMI) of 40.0 to 44.9 in adult, unspecified obesity type Ongoing management with recent weight loss of five pounds. Following a 1200 calorie nutrition plan, though not consistently tracking. Incorporating more whole foods and protein. Engaging in water  aerobics three times a week. Body fat percentage decreased from 53-54% to 52%, visceral fat decreased from 19 to 18. Slight increase in muscle mass. Appetite control remains challenging with persistent 'food noise'.  She has multiple unexpired Ozempic pens in her possession and insist that she wants to use medication to  aid in her weight loss.  We discussed the limitations of this approach as these medications need to be continued long-term to maintain weight loss and retain the cardiometabolic benefits. - Continue 8799 calorie nutrition plan with emphasis on whole foods and adequate protein intake. - Encourage consistent tracking of dietary intake. - Continue water  aerobics three times a week. - She will start Ozempic 0.5 mg once a week, with instructions on administration and potential side effects, including nausea, vomiting, diarrhea, and constipation. - Advise on maintaining a minimum of two meals per day with at least 90 grams of protein intake, possibly supplemented with protein shakes. - Discuss the importance of sustainable medication use and the psychological impact of abrupt discontinuation. Prediabetes Most recent A1c is  Lab Results  Component Value Date   HGBA1C 6.2 (H) 09/19/2023   HGBA1C 6.0 (H) 11/19/2020    Patient aware of disease state and risk of progression. This may contribute to abnormal cravings, fatigue and diabetic complications without having diabetes.   We have discussed treatment options which include: losing 7 to 10% of body weight, increasing physical activity to a goal of 150 minutes a week at moderate intensity.  Advised to maintain a diet low on simple and processed carbohydrates.  Educated on the  carb insulin  model for obesity  She had side effects to metformin in the past  She will be starting Ozempic 0.5 mg once a week  OSA (obstructive sleep apnea) - mild on CPAP Mild on PAP.  Losing 15% of body weight may reduce AHI.  Continue with current weight management strategy         Objective   Physical Exam:  Blood pressure 138/79, pulse 71, temperature 98.7 F (37.1 C), height 5' 5.5 (1.664 m), weight 258 lb (117 kg), SpO2 98%. Body mass index is 42.28 kg/m.  General: She is overweight, cooperative, alert, well developed, and in no acute distress. PSYCH:  Has normal mood, affect and thought process.   HEENT: EOMI, sclerae are anicteric. Lungs: Normal breathing effort, no conversational dyspnea. Extremities: No edema.  Neurologic: No gross sensory or motor deficits. No tremors or fasciculations noted.    Diagnostic Data Reviewed:  BMET    Component Value Date/Time   NA 142 09/19/2023 0942   K 4.1 09/19/2023 0942   CL 103 09/19/2023 0942   CO2 24 09/19/2023 0942   GLUCOSE 93 09/19/2023 0942   GLUCOSE 158 (H) 12/02/2020 0304   BUN 19 09/19/2023 0942   CREATININE 0.86 09/19/2023 0942   CALCIUM  9.6 09/19/2023 0942   GFRNONAA >60 12/02/2020 0304   GFRAA  10/24/2008 1246    >60        The eGFR has been calculated using the MDRD equation. This calculation has not been validated in all clinical situations. eGFR's persistently <60 mL/min signify possible Chronic Kidney Disease.   Lab Results  Component Value Date   HGBA1C 6.2 (H) 09/19/2023   HGBA1C 6.0 (H) 11/19/2020   Lab Results  Component Value Date   INSULIN  9.7 09/19/2023   Lab Results  Component Value Date   TSH 2.280 09/19/2023   CBC    Component Value Date/Time   WBC 6.6 09/19/2023 0942   WBC 12.1 (H) 12/02/2020 0304   RBC 4.58 09/19/2023 0942   RBC 3.72 (L) 12/02/2020 0304   HGB 11.8 09/19/2023 0942   HCT 39.4 09/19/2023 0942   PLT 239 09/19/2023 0942   MCV 86 09/19/2023 0942   MCH 25.8 (L) 09/19/2023 0942   MCH 26.1 12/02/2020 0304   MCHC 29.9 (L) 09/19/2023 0942   MCHC 31.3 12/02/2020 0304   RDW 13.5 09/19/2023 0942   Iron Studies    Component Value Date/Time   IRON 48 09/19/2023 0942   TIBC 329 09/19/2023 0942   FERRITIN 45 09/19/2023 0942   IRONPCTSAT 15 09/19/2023 0942   Lipid Panel     Component Value Date/Time   CHOL 169 09/19/2023 0942   TRIG 83 09/19/2023 0942   HDL 71 09/19/2023 0942   LDLCALC 83 09/19/2023 0942   Hepatic Function Panel     Component Value Date/Time   PROT 7.3 09/19/2023 0942   ALBUMIN 4.3 09/19/2023 0942    AST 24 09/19/2023 0942   ALT 19 09/19/2023 0942   ALKPHOS 112 09/19/2023 0942   BILITOT 0.3 09/19/2023 0942   BILIDIR <0.10 01/12/2023 1501      Component Value Date/Time   TSH 2.280 09/19/2023 0942   Nutritional Lab Results  Component Value Date   VD25OH 54.7 09/19/2023    Medications: Outpatient Encounter Medications as of 11/02/2023  Medication Sig   Accu-Chek Softclix Lancets lancets Use to check your blood sugar finger stick once a day Dx Code E11.9 for 90 days   amLODipine  (NORVASC ) 10 MG  tablet Take 10 mg by mouth daily.   atorvastatin  (LIPITOR) 20 MG tablet Take 20 mg by mouth daily.   Blood Glucose Monitoring Suppl (ACCU-CHEK GUIDE) w/Device KIT as directed to check blood sugar once a day Dx Code E11.9   cholecalciferol (VITAMIN D ) 25 MCG (1000 UNIT) tablet Take 1,000 Units by mouth daily.   Coenzyme Q10 (COQ10) 100 MG CAPS Take 100 mg by mouth daily.   Cyanocobalamin  (B-12 PO) Take 1 tablet by mouth daily.   levothyroxine  (SYNTHROID ) 150 MCG tablet Take 150 mcg by mouth daily before breakfast.   liothyronine (CYTOMEL) 5 MCG tablet Take 5 mcg by mouth daily.   losartan -hydrochlorothiazide  (HYZAAR) 100-25 MG tablet Take 1 tablet by mouth daily.    Magnesium  250 MG TABS Take 250 mg by mouth in the morning and at bedtime.   meloxicam (MOBIC) 15 MG tablet Take 1 tablet by mouth daily.   Omega-3 Fatty Acids (FISH OIL) 1000 MG CAPS 1 capsule Orally Once a day for 30 day(s)   Semaglutide, 1 MG/DOSE, 4 MG/3ML SOPN Inject 0.5 mg into the skin once a week.   pantoprazole  (PROTONIX ) 40 MG tablet Take 1 tablet (40 mg total) by mouth daily. (Patient not taking: Reported on 09/14/2023)   zinc gluconate 50 MG tablet Take 50 mg by mouth daily. (Patient not taking: Reported on 09/14/2023)   No facility-administered encounter medications on file as of 11/02/2023.     Follow-Up   Return in about 4 weeks (around 11/30/2023) for For Weight Mangement with Dr. Francyne.SABRA She was informed of  the importance of frequent follow up visits to maximize her success with intensive lifestyle modifications for her multiple health conditions.  Attestation Statement   Reviewed by clinician on day of visit: allergies, medications, problem list, medical history, surgical history, family history, social history, and previous encounter notes.     Lucas Francyne, MD

## 2023-11-03 DIAGNOSIS — E669 Obesity, unspecified: Secondary | ICD-10-CM | POA: Insufficient documentation

## 2023-11-03 NOTE — Assessment & Plan Note (Signed)
 Ongoing management with recent weight loss of five pounds. Following a 1200 calorie nutrition plan, though not consistently tracking. Incorporating more whole foods and protein. Engaging in water  aerobics three times a week. Body fat percentage decreased from 53-54% to 52%, visceral fat decreased from 19 to 18. Slight increase in muscle mass. Appetite control remains challenging with persistent 'food noise'.  She has multiple unexpired Ozempic pens in her possession and insist that she wants to use medication to aid in her weight loss.  We discussed the limitations of this approach as these medications need to be continued long-term to maintain weight loss and retain the cardiometabolic benefits. - Continue 8799 calorie nutrition plan with emphasis on whole foods and adequate protein intake. - Encourage consistent tracking of dietary intake. - Continue water  aerobics three times a week. - She will start Ozempic 0.5 mg once a week, with instructions on administration and potential side effects, including nausea, vomiting, diarrhea, and constipation. - Advise on maintaining a minimum of two meals per day with at least 90 grams of protein intake, possibly supplemented with protein shakes. - Discuss the importance of sustainable medication use and the psychological impact of abrupt discontinuation.

## 2023-11-03 NOTE — Assessment & Plan Note (Signed)
 Mild on PAP.  Losing 15% of body weight may reduce AHI.  Continue with current weight management strategy

## 2023-11-03 NOTE — Assessment & Plan Note (Signed)
 Blood pressure is above goal.  On amlodipine  and ARB hydrochlorothiazide  without adverse effects.    Most recent renal parameters reviewed and within normal limits.  Losing 10% of body weight may improve blood pressure control.  Continue current regimen  She will be starting semaglutide 0.5 mg once a week which may aid in blood pressure control.

## 2023-11-03 NOTE — Assessment & Plan Note (Signed)
 Most recent A1c is  Lab Results  Component Value Date   HGBA1C 6.2 (H) 09/19/2023   HGBA1C 6.0 (H) 11/19/2020    Patient aware of disease state and risk of progression. This may contribute to abnormal cravings, fatigue and diabetic complications without having diabetes.   We have discussed treatment options which include: losing 7 to 10% of body weight, increasing physical activity to a goal of 150 minutes a week at moderate intensity.  Advised to maintain a diet low on simple and processed carbohydrates.  Educated on the carb insulin  model for obesity  She had side effects to metformin in the past  She will be starting Ozempic 0.5 mg once a week

## 2023-11-03 NOTE — Assessment & Plan Note (Signed)
 LDL is not at goal. Elevated LDL may be secondary to nutrition, genetics and spillover effect from excess adiposity. Recommended LDL goal is <70 to reduce the risk of fatty streaks and the progression to obstructive ASCVD in the future.   Her 10 year risk is: The 10-year ASCVD risk score (Arnett DK, et al., 2019) is: 10.7%  Lab Results  Component Value Date   CHOL 169 09/19/2023   HDL 71 09/19/2023   LDLCALC 83 09/19/2023   TRIG 83 09/19/2023    Continue weight loss therapy, losing 10% or more of body weight may improve condition.  May benefit from increasing statin intensity for an LDL goal of less than 70 consider intermediate cardiovascular risk

## 2023-11-10 DIAGNOSIS — Z96651 Presence of right artificial knee joint: Secondary | ICD-10-CM | POA: Diagnosis not present

## 2023-11-29 ENCOUNTER — Ambulatory Visit: Attending: Orthopedic Surgery | Admitting: Physical Therapy

## 2023-11-29 ENCOUNTER — Encounter: Payer: Self-pay | Admitting: Physical Therapy

## 2023-11-29 DIAGNOSIS — M6281 Muscle weakness (generalized): Secondary | ICD-10-CM | POA: Insufficient documentation

## 2023-11-29 DIAGNOSIS — R2689 Other abnormalities of gait and mobility: Secondary | ICD-10-CM | POA: Diagnosis not present

## 2023-11-29 DIAGNOSIS — M5459 Other low back pain: Secondary | ICD-10-CM | POA: Insufficient documentation

## 2023-11-29 NOTE — Therapy (Signed)
 OUTPATIENT PHYSICAL THERAPY TREATMENT   Patient Name: Ashley York MRN: [id number] DOB:04-23-1955, 68 y.o., female Today's Date: 11/29/2023  END OF SESSION:  PT End of Session - 11/29/23 1103     Visit Number 11    Number of Visits 17    Date for PT Re-Evaluation 01/24/24    Authorization Type [employer] MCR    Authorization Time Period Submitted for 12 additional viists    PT Start Time 1101    PT Stop Time 1143    PT Time Calculation (min) 42 min    Activity Tolerance Patient tolerated treatment well    Behavior During Therapy WFL for tasks assessed/performed         Past Medical History:  Diagnosis Date   Allergy    Back pain    CHOLELITHIASIS 05/10/2008   Qualifier: Diagnosis of  By: Tivis RN, Channing     CONSTIPATION 04/23/2008   Qualifier: Diagnosis of  By: Abran MD, Norleen SAILOR    Dyspnea on exertion    Gallstones    GERD (gastroesophageal reflux disease)    Heart murmur    HEMORRHOIDS, INTERNAL 04/19/2008   Qualifier: Diagnosis of  By: Claudene GUSS Railing     Hyperlipidemia    Hypertension    HYPERTENSION 04/19/2008   Qualifier: Diagnosis of  By: Claudene GUSS Railing     Hypothyroidism    HYPOTHYROIDISM 04/19/2008   Qualifier: Diagnosis of  By: Claudene GUSS Railing     Joint pain    Osteoarthritis    Overweight    Pre-diabetes    Sleep apnea    cpap   SOB (shortness of breath)    Spondylosis    Vitamin B 12 deficiency    Vitamin D  deficiency    Past Surgical History:  Procedure Laterality Date   CESAREAN SECTION  1983, 1984   COLONOSCOPY     KNEE SURGERY Right 03/22/2008   TOTAL KNEE ARTHROPLASTY Left 12/01/2020   Procedure: TOTAL KNEE ARTHROPLASTY;  Surgeon: Melodi Lerner, MD;  Location: [facility];  Service: Orthopedics;  Laterality: Left;    WISDOM TOOTH EXTRACTION  03/22/1997   Patient Active Problem List   Diagnosis Date Noted   Obesity, unspecified 11/03/2023   Prediabetes 09/19/2023   OSA (obstructive sleep apnea) - mild on CPAP 09/19/2023    Pure hypercholesterolemia 09/19/2023   Anemia 09/19/2023   Daytime somnolence 09/19/2023   Vitamin D  deficiency 09/19/2023   OA (osteoarthritis) of knee 12/01/2020   Dyspnea on exertion 11/17/2017   CHOLELITHIASIS 05/10/2008   CONSTIPATION 04/23/2008   Hypothyroidism 04/19/2008   Essential hypertension 04/19/2008   HEMORRHOIDS, INTERNAL 04/19/2008    PCP: Rexanne Ingle, MD  REFERRING PROVIDER: Burnetta Aures, MD  REFERRING DIAG: M54.50 (ICD-10-CM) - Low back pain, unspecified   Rationale for Evaluation and Treatment: Rehabilitation  THERAPY DIAG:  Other low back pain - Plan: PT plan of care cert/re-cert  Muscle weakness (generalized) - Plan: PT plan of care cert/re-cert  Other abnormalities of gait and mobility - Plan: PT plan of care cert/re-cert  ONSET DATE: Chronic  SUBJECTIVE:  SUBJECTIVE STATEMENT: Pt reports that she has been trying to get in but has been unable to schedule.  She .  EVAL: Pt presents to PT with reports of chronic lower back pain with referral into LE R>L. Notes pain increases with prolonged standing and walking. Some N/T in bilateral LE also R>L. Does aquatic exercise and notes this seem sto help her discomfort some too. Denies bowel/bladder changes or saddle anesthesia.   PERTINENT HISTORY:  HTN: also bilat knee replacements   PAIN:  Are you having pain?  Yes: NPRS scale: 2/10 Worst: 4/10 Pain location: lower back,R LE Pain description: Tired Aggravating factors: standing, walking Relieving factors: rest, medication  PRECAUTIONS: Spondylolisthesis   RED FLAGS: None   WEIGHT BEARING RESTRICTIONS: No  FALLS:  Has patient fallen in last 6 months? No  LIVING ENVIRONMENT: Lives with: lives alone Lives in: House/apartment Stairs: No Has following  equipment at home: Single point cane  OCCUPATION: Retired  PLOF: Independent  PATIENT GOALS: Pt wants to decrease back pain and improve comfort with standing and walking  OBJECTIVE:  Note: Objective measures were completed at Evaluation unless otherwise noted.  DIAGNOSTIC FINDINGS: See imaging  PATIENT SURVEYS:  ODI: 18/50 - 36% disability  COGNITION: Overall cognitive status: Within functional limits for tasks assessed     SENSATION: [facility]  MUSCLE LENGTH: Thomas test: Right (+); Left (+)  POSTURE: rounded shoulders, forward head, and increased lumbar lordosis  PALPATION: TTP to lumbar paraspinals and R gluteals   LUMBAR ROM:   AROM eval  Flexion 50%  Extension 25%  Right lateral flexion   Left lateral flexion   Right rotation   Left rotation    (Blank rows = not tested)  LOWER EXTREMITY MMT:    MMT Right eval Left eval  Hip flexion 3+ 3+  Hip extension    Hip abduction 3+ 3+  Hip adduction    Hip internal rotation    Hip external rotation    Knee flexion 3+ 3+  Knee extension 3+ 3+  Ankle dorsiflexion    Ankle plantarflexion    Ankle inversion    Ankle eversion     (Blank rows = not tested)  LUMBAR SPECIAL TESTS:  Straight leg raise test: Negative and Slump test: Negative  FUNCTIONAL TESTS:  30 Second Sit to Stand: 12 reps with UE  GAIT: Distance walked: 59ft Assistive device utilized: None Level of assistance: Complete Independence Comments: flexed trunk  TREATMENT:  [facility] Adult PT Treatment  11/29/2023:  Therapeutic Exercise:  Therapeutic Exercise  LTR - some cramping reported Supine PPT with ball 1x10  Pilates SLR 1x10 ea Supine clam - black - 2x10 Bridge on ball  2x10 - small arc  Supine hamstring stretch 30 sec ea  Therapeutic Activity Step ups 6 in x10 ea Lateral Step ups 6 in x10 ea Standing paloff press - 2x GTB - 2x10 ea Goal assessment  Neuromuscular re-ed: Semi tandem stance    [facility] Adult PT Treatment:                                                 DATE: 10/21/23 Aquatic therapy at [facility]- [address] - therapeutic pool temp 92 degrees Pt enters building independently.  Treatment took place in water  3.8 to  4 ft 8 in.feet deep depending upon activity.  Pt entered and exited the  pool via stair and handrails    Aquatic Therapy: Water  walking for warm up fwd/lat/bkwds Farmers carry yellow DB Suitcase carry yellow DB Walking marches yellow DB by sides x2 laps L stretch 5 hold x10 SL RDL with white barbell to pool wall x10 BIL Deep water  jogging x3' Deep water  jumping jacks x3' Noodle stomp x20 BIL Water  bell - shoulder flex/ext - narrow bos Alternating  Walking lunges Tandem walking Reverse Step ups x10 BIL HS stretch at step   Pt requires the buoyancy of water  for active assisted exercises with buoyancy supported for strengthening and AROM exercises. Hydrostatic pressure also supports joints by unweighting joint load by at least 50 % in 3-4 feet depth water . 80% in chest to neck deep water . Water  will provide assistance with movement using the current and laminar flow while the buoyancy reduces weight bearing. Pt requires the viscosity of the water  for resistance with strengthening exercises.   TREATMENT 10/13/23:  Aquatic therapy at [facility]- [address] - therapeutic pool temp 92 degrees Pt enters building independently.  Treatment took place in water  3.8 to  4 ft 8 in.feet deep depending upon activity.  Pt entered and exited the pool via stair and handrails    Aquatic Therapy:  Water  walking for warm up fwd/lat/bkwds  L stretch Farmers carry Suitcase carry SL RDL with white barbell to pool wall Step up with march Deep water  jogging Deep water  jumping jack HS stretch at step Slow march with water  bells Noodle stomp Water  bell - shoulder flex/ext - narrow bos Alternating  Tandem walking reverse Step up fwd and lat with march Walking lunges -    Pt requires  the buoyancy of water  for active assisted exercises with buoyancy supported for strengthening and AROM exercises. Hydrostatic pressure also supports joints by unweighting joint load by at least 50 % in 3-4 feet depth water . 80% in chest to neck deep water . Water  will provide assistance with movement using the current and laminar flow while the buoyancy reduces weight bearing. Pt requires the viscosity of the water  for resistance with strengthening exercises.  [facility] Adult PT Treatment:                                                DATE: 10/10/23 Therapeutic Exercise Nustep level 5 UE/LE x 4 minutes for functional activity tolerance Supine PPT with ball 1x10 disc. d/t cramping bilateral hamstrings  Pilates SLR 1x10 ea S/L hip abd x 8 ea Bridge 1x10 - disc. d/t cramping bilateral hamstrings  Supine hamstring stretch 30 sec ea Repeated lumbar flex with swiss ball in sitting 2x10  Standing hip abd/ext 1x10 ea Therapeutic Activity STS table 2x10 - 1 set high 1 low Standing hip flexion to step - 3# ankle weight 2x10 each  Step ups 4in x10 ea Lateral Step ups 4in 10 ea      PATIENT EDUCATION:  Education details: eval findings, ODI, HEP, POC Person educated: Patient Education method: Explanation, Demonstration, and Handouts Education comprehension: verbalized understanding and returned demonstration  HOME EXERCISE PROGRAM: Access Code: [email or web address] URL: [email or web address] Date: 11/29/2023 Prepared by: Helene Gasmen  Exercises - Modified Thomas Stretch  - 1 x daily - 7 x weekly - 2 reps - 30 sec hold - Supine Posterior Pelvic Tilt  - 1 x daily - 7 x weekly - 2 sets - 10 reps -  5 sec hold - Active Straight Leg Raise with Quad Set  - 1 x daily - 7 x weekly - 2 sets - 10 reps - Supine Lower Trunk Rotation  - 1 x daily - 7 x weekly - 10 reps - Hooklying Clamshell with Resistance  - 1 x daily - 7 x weekly - 2-3 sets - 10 reps - green band hold - Standing Hip Abduction with  Counter Support  - 1 x daily - 7 x weekly - 2 sets - 10 reps - Standing Hip Extension with Counter Support  - 1 x daily - 7 x weekly - 2 sets - 10 reps - Sit to Stand Without Arm Support  - 1 x daily - 4-7 x weekly - 2 sets - 10 reps - Semi-Tandem Balance at [employer] Eyes Open  - 1 x daily - 7 x weekly - 1 sets - 3 reps - 30 seconds hold  ASSESSMENT:  CLINICAL IMPRESSION: Pt arrives to clinic after >1 month absence d/t busy schedule and unable to find compatible appt times.  Pt reports that prior to absence she was seeing progress with PT.  She has tried to continue both aquatic and land based exercises but has had some increase in pain since stopping therapy.  Overall her pain and fatigue is improved since the start of therapy.  Upon goal re-check she has made progress toward all goals with the exception of standing tolerance.  She will benefit from continued therapy to address remaining deficits and improve standing tolerance in order to complete daily tasks such as shopping and housework.   EVAL: Patient is a 68 y.o. F who was seen today for physical therapy evaluation and treatment for chronic LBP with referral into bilateral LE R>L. Physical findings are consistent with MD impression as pt demonstrates decrease in core and proximal hip strength and functional mobility. ODI score shows moderate disability in performance of home ADLs and community activities. Pt would benefit from skilled PT services working on improving core and proximal hip strength in order to decrease pain in gym and aquatic environments.   OBJECTIVE IMPAIRMENTS: Abnormal gait, decreased activity tolerance, decreased mobility, difficulty walking, decreased ROM, decreased strength, postural dysfunction, and pain   ACTIVITY LIMITATIONS: carrying, lifting, standing, squatting, stairs, transfers, and locomotion level  PARTICIPATION LIMITATIONS: meal prep, cleaning, driving, shopping, community activity, occupation, and yard  work  PERSONAL FACTORS: Time since onset of injury/illness/exacerbation and 1-2 comorbidities: HTN are also affecting patient's functional outcome.   REHAB POTENTIAL: Good  CLINICAL DECISION MAKING: Evolving/moderate complexity  EVALUATION COMPLEXITY: Moderate   GOALS: Goals reviewed with patient? No  SHORT TERM GOALS: Target date: 09/29/2023   Pt will be compliant and knowledgeable with initial HEP for improved comfort and carryover Baseline: initial HEP given  Goal status: MET  2.  Pt will self report low back pain no greater than 5/10 for improved comfort and functional ability Baseline: 7/10 at worst 10/03/2023: 2/10 at worst Goal status: MET   LONG TERM GOALS: Target date: 11/03/2023 extended to 01/24/2024    Pt will be decrease ODI disability score to no greater than 25% (12/50) as proxy for functional improvement Baseline: 36% disability - 18/50  10/03/2023: 28% disability - 14/50 8/14: 15/50 Goal status: IN PROGRESS  2.  Pt will self report low back pain no greater than 3/10 for improved comfort and functional ability Baseline: 7/10 at worst 10/21/23: 5/10 at highest  9/9: 4/10 Goal status: Progressing  3.  Pt  will increase 30 Second Sit to Stand rep count to no less than 14 reps for improved balance, strength, and functional mobility Baseline: 12 reps with UE 10/03/2023: 13 reps no UE 9/9: 11 no UE support  Goal status: Ongoing   4.  Pt will improve standing and walking activity tolerance to no less than 30-45 minutes for improved comfort and functional ability with community activities  Baseline: 15 minutes 9/9: 15 min Goal status: Ongoing   PLAN:  PT FREQUENCY: 2x/week  PT DURATION: 8 weeks  PLANNED INTERVENTIONS: 97164- PT Re-evaluation, 97110-Therapeutic exercises, 97530- Therapeutic activity, 97112- Neuromuscular re-education, 97535- Self Care, [id number]- Manual therapy, [id number]- Gait training, [phone]- Aquatic Therapy, [phone]- Electrical stimulation  (unattended), [phone]- Electrical stimulation (manual), 20560 (1-2 muscles), 20561 (3+ muscles)- Dry Needling, Cryotherapy, and Moist heat  PLAN FOR NEXT SESSION: assess HEP response, neutral spine core strengthening, hip flexor stretching    Helene FORBES Gasmen PT 11/29/23 11:54 AM  Date of referral: 08/24/2023 Referring provider: Burnetta Aures, MD Referring diagnosis? M54.50 (ICD-10-CM) - Low back pain, unspecified  Treatment diagnosis? (if different than referring diagnosis) Other low back pain Muscle weakness (generalized) Other abnormalities of gait and mobility   What was this (referring dx) caused by? Arthritis   Nature of Condition: Chronic (continuous duration > 3 months)             Laterality: Both   Current Functional Measure Score:  ODI: 15/50 disability  Objective measurements identify impairments when they are compared to normal values, the uninvolved extremity, and prior level of function.  [x]  Yes  []  No  Objective assessment of functional ability: Moderate functional limitations   Briefly describe symptoms: fatigue and pain which limits standing and walking activities to 15 min  How did symptoms start: chonic  Average pain intensity:  Last 24 hours: 4  Past week: 4  How often does the pt experience symptoms? Frequently  How much have the symptoms interfered with usual daily activities? Moderately  How has condition changed since care began at this facility? A little better  In general, how is the patients overall health? Good   BACK PAIN (STarT Back Screening Tool) Has pain spread down the leg(s) at some time in the last 2 weeks? n Has there been pain in the shoulder or neck at some time in the last 2 weeks? n Has the pt only walked short distances because of back pain? y Has patient dressed more slowly because of back pain in the past 2 weeks? y Does patient think it's not safe for a person with this condition to be physically active? n Does patient  have worrying thoughts a lot of the time? n Does patient feel back pain is terrible and will never get any better? n Has patient stopped enjoying things they usually enjoy? y

## 2023-11-30 ENCOUNTER — Encounter: Payer: Self-pay | Admitting: Physical Therapy

## 2023-11-30 ENCOUNTER — Ambulatory Visit: Admitting: Physical Therapy

## 2023-11-30 DIAGNOSIS — R2689 Other abnormalities of gait and mobility: Secondary | ICD-10-CM

## 2023-11-30 DIAGNOSIS — M6281 Muscle weakness (generalized): Secondary | ICD-10-CM | POA: Diagnosis not present

## 2023-11-30 DIAGNOSIS — M5459 Other low back pain: Secondary | ICD-10-CM | POA: Diagnosis not present

## 2023-11-30 NOTE — Therapy (Signed)
 OUTPATIENT PHYSICAL THERAPY TREATMENT   Patient Name: Ashley York MRN: [id number] DOB:April 28, 1955, 68 y.o., female Today's Date: 11/30/2023  END OF SESSION:  PT End of Session - 11/30/23 1336     Visit Number 12    Number of Visits 17    Date for PT Re-Evaluation 01/24/24    Authorization Type [employer] MCR    Authorization Time Period Submitted for 12 additional viists    PT Start Time 1330    PT Stop Time 1412    PT Time Calculation (min) 42 min    Activity Tolerance Patient tolerated treatment well    Behavior During Therapy [facility] for tasks assessed/performed          Past Medical History:  Diagnosis Date   Allergy    Back pain    CHOLELITHIASIS 05/10/2008   Qualifier: Diagnosis of  By: Tivis RN, Channing     CONSTIPATION 04/23/2008   Qualifier: Diagnosis of  By: Abran MD, Norleen SAILOR    Dyspnea on exertion    Gallstones    GERD (gastroesophageal reflux disease)    Heart murmur    HEMORRHOIDS, INTERNAL 04/19/2008   Qualifier: Diagnosis of  By: Claudene GUSS Railing     Hyperlipidemia    Hypertension    HYPERTENSION 04/19/2008   Qualifier: Diagnosis of  By: Claudene GUSS Railing     Hypothyroidism    HYPOTHYROIDISM 04/19/2008   Qualifier: Diagnosis of  By: Claudene GUSS Railing     Joint pain    Osteoarthritis    Overweight    Pre-diabetes    Sleep apnea    cpap   SOB (shortness of breath)    Spondylosis    Vitamin B 12 deficiency    Vitamin D  deficiency    Past Surgical History:  Procedure Laterality Date   CESAREAN SECTION  1983, 1984   COLONOSCOPY     KNEE SURGERY Right 03/22/2008   TOTAL KNEE ARTHROPLASTY Left 12/01/2020   Procedure: TOTAL KNEE ARTHROPLASTY;  Surgeon: Melodi Lerner, MD;  Location: [facility];  Service: Orthopedics;  Laterality: Left;    WISDOM TOOTH EXTRACTION  03/22/1997   Patient Active Problem List   Diagnosis Date Noted   Obesity, unspecified 11/03/2023   Prediabetes 09/19/2023   OSA (obstructive sleep apnea) - mild on CPAP  09/19/2023   Pure hypercholesterolemia 09/19/2023   Anemia 09/19/2023   Daytime somnolence 09/19/2023   Vitamin D  deficiency 09/19/2023   OA (osteoarthritis) of knee 12/01/2020   Dyspnea on exertion 11/17/2017   CHOLELITHIASIS 05/10/2008   CONSTIPATION 04/23/2008   Hypothyroidism 04/19/2008   Essential hypertension 04/19/2008   HEMORRHOIDS, INTERNAL 04/19/2008    PCP: Rexanne Ingle, MD  REFERRING PROVIDER: Burnetta Aures, MD  REFERRING DIAG: M54.50 (ICD-10-CM) - Low back pain, unspecified   Rationale for Evaluation and Treatment: Rehabilitation  THERAPY DIAG:  Other low back pain  Muscle weakness (generalized)  Other abnormalities of gait and mobility  ONSET DATE: Chronic  SUBJECTIVE:  SUBJECTIVE STATEMENT: Pt reports that she is doing fairly well today.  Still having difficulty with standing for longer periods.  EVAL: Pt presents to PT with reports of chronic lower back pain with referral into LE R>L. Notes pain increases with prolonged standing and walking. Some N/T in bilateral LE also R>L. Does aquatic exercise and notes this seem sto help her discomfort some too. Denies bowel/bladder changes or saddle anesthesia.   PERTINENT HISTORY:  HTN: also bilat knee replacements   PAIN:  Are you having pain?  Yes: NPRS scale: 2/10 Worst: 4/10 Pain location: lower back,R LE Pain description: Tired Aggravating factors: standing, walking Relieving factors: rest, medication  PRECAUTIONS: Spondylolisthesis   RED FLAGS: None   WEIGHT BEARING RESTRICTIONS: No  FALLS:  Has patient fallen in last 6 months? No  LIVING ENVIRONMENT: Lives with: lives alone Lives in: House/apartment Stairs: No Has following equipment at home: Single point cane  OCCUPATION: Retired  PLOF:  Independent  PATIENT GOALS: Pt wants to decrease back pain and improve comfort with standing and walking  OBJECTIVE:  Note: Objective measures were completed at Evaluation unless otherwise noted.  DIAGNOSTIC FINDINGS: See imaging  PATIENT SURVEYS:  ODI: 18/50 - 36% disability  COGNITION: Overall cognitive status: Within functional limits for tasks assessed     SENSATION: [facility]  MUSCLE LENGTH: Thomas test: Right (+); Left (+)  POSTURE: rounded shoulders, forward head, and increased lumbar lordosis  PALPATION: TTP to lumbar paraspinals and R gluteals   LUMBAR ROM:   AROM eval  Flexion 50%  Extension 25%  Right lateral flexion   Left lateral flexion   Right rotation   Left rotation    (Blank rows = not tested)  LOWER EXTREMITY MMT:    MMT Right eval Left eval  Hip flexion 3+ 3+  Hip extension    Hip abduction 3+ 3+  Hip adduction    Hip internal rotation    Hip external rotation    Knee flexion 3+ 3+  Knee extension 3+ 3+  Ankle dorsiflexion    Ankle plantarflexion    Ankle inversion    Ankle eversion     (Blank rows = not tested)  LUMBAR SPECIAL TESTS:  Straight leg raise test: Negative and Slump test: Negative  FUNCTIONAL TESTS:  30 Second Sit to Stand: 12 reps with UE  GAIT: Distance walked: 30ft Assistive device utilized: None Level of assistance: Complete Independence Comments: flexed trunk  TREATMENT:  [facility] Adult PT Treatment:                                                DATE: 11/30/23 Aquatic therapy at [facility] [address] - therapeutic pool temp 92 degrees Pt enters building independently.  Treatment took place in water  3.8 to  4 ft 8 in.feet deep depending upon activity.  Pt entered and exited the pool via stair and handrails    Aquatic Therapy: Water  walking for warm up fwd/lat/bkwds High march with opposite weight tap Suitcase carry Blue DB Walking L stretch 5 hold x10 SL RDL with white barbell to pool wall x10 BIL W/  hip openers Deep water  jogging x3' Noodle stomp AMRAP - 1' ea Jump lunges Hip flexor stretch at steps   Pt requires the buoyancy of water  for active assisted exercises with buoyancy supported for strengthening and AROM exercises. Hydrostatic pressure also supports  joints by unweighting joint load by at least 50 % in 3-4 feet depth water . 80% in chest to neck deep water . Water  will provide assistance with movement using the current and laminar flow while the buoyancy reduces weight bearing. Pt requires the viscosity of the water  for resistance with strengthening exercises.  [facility] Adult PT Treatment  11/29/2023:  Therapeutic Exercise:  Therapeutic Exercise  LTR - some cramping reported Supine PPT with ball 1x10  Pilates SLR 1x10 ea Supine clam - black - 2x10 Bridge on ball  2x10 - small arc  Supine hamstring stretch 30 sec ea  Therapeutic Activity Step ups 6 in x10 ea Lateral Step ups 6 in x10 ea Standing paloff press - 2x GTB - 2x10 ea Goal assessment  Neuromuscular re-ed: Semi tandem stance    [facility] Adult PT Treatment:                                                DATE: 10/21/23 Aquatic therapy at [facility] [address] - therapeutic pool temp 92 degrees Pt enters building independently.  Treatment took place in water  3.8 to  4 ft 8 in.feet deep depending upon activity.  Pt entered and exited the pool via stair and handrails    Aquatic Therapy: Water  walking for warm up fwd/lat/bkwds Farmers carry yellow DB Suitcase carry yellow DB Walking marches yellow DB by sides x2 laps L stretch 5 hold x10 SL RDL with white barbell to pool wall x10 BIL Deep water  jogging x3' Deep water  jumping jacks x3' Noodle stomp x20 BIL Water  bell - shoulder flex/ext - narrow bos Alternating  Walking lunges Tandem walking Reverse Step ups x10 BIL HS stretch at step   Pt requires the buoyancy of water  for active assisted exercises with buoyancy supported for strengthening and AROM  exercises. Hydrostatic pressure also supports joints by unweighting joint load by at least 50 % in 3-4 feet depth water . 80% in chest to neck deep water . Water  will provide assistance with movement using the current and laminar flow while the buoyancy reduces weight bearing. Pt requires the viscosity of the water  for resistance with strengthening exercises.   TREATMENT 10/13/23:  Aquatic therapy at [facility]- [address] - therapeutic pool temp 92 degrees Pt enters building independently.  Treatment took place in water  3.8 to  4 ft 8 in.feet deep depending upon activity.  Pt entered and exited the pool via stair and handrails    Aquatic Therapy:  Water  walking for warm up fwd/lat/bkwds  L stretch Farmers carry Suitcase carry SL RDL with white barbell to pool wall Step up with march Deep water  jogging Deep water  jumping jack HS stretch at step Slow march with water  bells Noodle stomp Water  bell - shoulder flex/ext - narrow bos Alternating  Tandem walking reverse Step up fwd and lat with march Walking lunges -    Pt requires the buoyancy of water  for active assisted exercises with buoyancy supported for strengthening and AROM exercises. Hydrostatic pressure also supports joints by unweighting joint load by at least 50 % in 3-4 feet depth water . 80% in chest to neck deep water . Water  will provide assistance with movement using the current and laminar flow while the buoyancy reduces weight bearing. Pt requires the viscosity of the water  for resistance with strengthening exercises.  [facility] Adult PT Treatment:  DATE: 10/10/23 Therapeutic Exercise Nustep level 5 UE/LE x 4 minutes for functional activity tolerance Supine PPT with ball 1x10 disc. d/t cramping bilateral hamstrings  Pilates SLR 1x10 ea S/L hip abd x 8 ea Bridge 1x10 - disc. d/t cramping bilateral hamstrings  Supine hamstring stretch 30 sec ea Repeated lumbar flex with  swiss ball in sitting 2x10  Standing hip abd/ext 1x10 ea Therapeutic Activity STS table 2x10 - 1 set high 1 low Standing hip flexion to step - 3# ankle weight 2x10 each  Step ups 4in x10 ea Lateral Step ups 4in 10 ea      PATIENT EDUCATION:  Education details: eval findings, ODI, HEP, POC Person educated: Patient Education method: Explanation, Demonstration, and Handouts Education comprehension: verbalized understanding and returned demonstration  HOME EXERCISE PROGRAM: Access Code: [email or web address] URL: [email or web address] Date: 11/29/2023 Prepared by: Helene Gasmen  Exercises - Modified Thomas Stretch  - 1 x daily - 7 x weekly - 2 reps - 30 sec hold - Supine Posterior Pelvic Tilt  - 1 x daily - 7 x weekly - 2 sets - 10 reps - 5 sec hold - Active Straight Leg Raise with Quad Set  - 1 x daily - 7 x weekly - 2 sets - 10 reps - Supine Lower Trunk Rotation  - 1 x daily - 7 x weekly - 10 reps - Hooklying Clamshell with Resistance  - 1 x daily - 7 x weekly - 2-3 sets - 10 reps - green band hold - Standing Hip Abduction with Counter Support  - 1 x daily - 7 x weekly - 2 sets - 10 reps - Standing Hip Extension with Counter Support  - 1 x daily - 7 x weekly - 2 sets - 10 reps - Sit to Stand Without Arm Support  - 1 x daily - 4-7 x weekly - 2 sets - 10 reps - Semi-Tandem Balance at [employer] Eyes Open  - 1 x daily - 7 x weekly - 1 sets - 3 reps - 30 seconds hold  ASSESSMENT:  CLINICAL IMPRESSION: Session today focused on hip and core strength in the aquatic environment for use of buoyancy to offload joints and the viscosity of water  as resistance during therapeutic exercise.  Emphasized SLS activities today for combination of strength and balance which pt finds challenging; cuing provided for form.  Patient was able to tolerate all prescribed exercises in the aquatic environment with no adverse effects and reports no increase in pain at the end of the session. Patient  continues to benefit from skilled PT services on land and aquatic based and should be progressed as able to improve functional independence.     EVAL: Patient is a 68 y.o. F who was seen today for physical therapy evaluation and treatment for chronic LBP with referral into bilateral LE R>L. Physical findings are consistent with MD impression as pt demonstrates decrease in core and proximal hip strength and functional mobility. ODI score shows moderate disability in performance of home ADLs and community activities. Pt would benefit from skilled PT services working on improving core and proximal hip strength in order to decrease pain in gym and aquatic environments.   OBJECTIVE IMPAIRMENTS: Abnormal gait, decreased activity tolerance, decreased mobility, difficulty walking, decreased ROM, decreased strength, postural dysfunction, and pain   ACTIVITY LIMITATIONS: carrying, lifting, standing, squatting, stairs, transfers, and locomotion level  PARTICIPATION LIMITATIONS: meal prep, cleaning, driving, shopping, community activity, occupation, and yard work  PERSONAL FACTORS: Time since onset of injury/illness/exacerbation  and 1-2 comorbidities: HTN are also affecting patient's functional outcome.   REHAB POTENTIAL: Good  CLINICAL DECISION MAKING: Evolving/moderate complexity  EVALUATION COMPLEXITY: Moderate   GOALS: Goals reviewed with patient? No  SHORT TERM GOALS: Target date: 09/29/2023   Pt will be compliant and knowledgeable with initial HEP for improved comfort and carryover Baseline: initial HEP given  Goal status: MET  2.  Pt will self report low back pain no greater than 5/10 for improved comfort and functional ability Baseline: 7/10 at worst 10/03/2023: 2/10 at worst Goal status: MET   LONG TERM GOALS: Target date: 11/03/2023 extended to 01/24/2024    Pt will be decrease ODI disability score to no greater than 25% (12/50) as proxy for functional improvement Baseline: 36%  disability - 18/50  10/03/2023: 28% disability - 14/50 8/14: 15/50 Goal status: IN PROGRESS  2.  Pt will self report low back pain no greater than 3/10 for improved comfort and functional ability Baseline: 7/10 at worst 10/21/23: 5/10 at highest  9/9: 4/10 Goal status: Progressing  3.  Pt will increase 30 Second Sit to Stand rep count to no less than 14 reps for improved balance, strength, and functional mobility Baseline: 12 reps with UE 10/03/2023: 13 reps no UE 9/9: 11 no UE support  Goal status: Ongoing   4.  Pt will improve standing and walking activity tolerance to no less than 30-45 minutes for improved comfort and functional ability with community activities  Baseline: 15 minutes 9/9: 15 min Goal status: Ongoing   PLAN:  PT FREQUENCY: 2x/week  PT DURATION: 8 weeks  PLANNED INTERVENTIONS: [id number]- PT Re-evaluation, 97110-Therapeutic exercises, 97530- Therapeutic activity, 97112- Neuromuscular re-education, 97535- Self Care, [id number]- Manual therapy, [id number]- Gait training, [phone]- Aquatic Therapy, [phone]- Electrical stimulation (unattended), [phone]- Electrical stimulation (manual), 20560 (1-2 muscles), 20561 (3+ muscles)- Dry Needling, Cryotherapy, and Moist heat  PLAN FOR NEXT SESSION: assess HEP response, neutral spine core strengthening, hip flexor stretching    Helene FORBES Gasmen PT 11/30/23 1:37 PM  Date of referral: 08/24/2023 Referring provider: Burnetta Aures, MD Referring diagnosis? M54.50 (ICD-10-CM) - Low back pain, unspecified  Treatment diagnosis? (if different than referring diagnosis) Other low back pain Muscle weakness (generalized) Other abnormalities of gait and mobility   What was this (referring dx) caused by? Arthritis   Nature of Condition: Chronic (continuous duration > 3 months)             Laterality: Both   Current Functional Measure Score:  ODI: 15/50 disability  Objective measurements identify impairments when they are compared to normal values,  the uninvolved extremity, and prior level of function.  [x]  Yes  []  No  Objective assessment of functional ability: Moderate functional limitations   Briefly describe symptoms: fatigue and pain which limits standing and walking activities to 15 min  How did symptoms start: chonic  Average pain intensity:  Last 24 hours: 4  Past week: 4  How often does the pt experience symptoms? Frequently  How much have the symptoms interfered with usual daily activities? Moderately  How has condition changed since care began at this facility? A little better  In general, how is the patients overall health? Good   BACK PAIN (STarT Back Screening Tool) Has pain spread down the leg(s) at some time in the last 2 weeks? n Has there been pain in the shoulder or neck at some time in the last 2 weeks? n Has the pt only walked short distances because of back pain? y Has  patient dressed more slowly because of back pain in the past 2 weeks? y Does patient think it's not safe for a person with this condition to be physically active? n Does patient have worrying thoughts a lot of the time? n Does patient feel back pain is terrible and will never get any better? n Has patient stopped enjoying things they usually enjoy? y

## 2023-12-05 ENCOUNTER — Encounter: Payer: Self-pay | Admitting: Physical Therapy

## 2023-12-05 ENCOUNTER — Ambulatory Visit: Admitting: Physical Therapy

## 2023-12-05 DIAGNOSIS — M6281 Muscle weakness (generalized): Secondary | ICD-10-CM | POA: Diagnosis not present

## 2023-12-05 DIAGNOSIS — R2689 Other abnormalities of gait and mobility: Secondary | ICD-10-CM

## 2023-12-05 DIAGNOSIS — M5459 Other low back pain: Secondary | ICD-10-CM | POA: Diagnosis not present

## 2023-12-05 NOTE — Therapy (Signed)
 OUTPATIENT PHYSICAL THERAPY TREATMENT   Patient Name: Ashley York MRN: [id number] DOB:11-25-1955, 68 y.o., female Today's Date: 12/05/2023  END OF SESSION:  PT End of Session - 12/05/23 1502     Visit Number 13    Number of Visits 17    Date for PT Re-Evaluation 01/24/24    Authorization Type [employer] MCR    Authorization Time Period Submitted for 12 additional viists    PT Start Time 0301    PT Stop Time 0342    PT Time Calculation (min) 41 min    Activity Tolerance Patient tolerated treatment well    Behavior During Therapy [facility] for tasks assessed/performed          Past Medical History:  Diagnosis Date   Allergy    Back pain    CHOLELITHIASIS 05/10/2008   Qualifier: Diagnosis of  By: Tivis RN, Channing     CONSTIPATION 04/23/2008   Qualifier: Diagnosis of  By: Abran MD, Norleen SAILOR    Dyspnea on exertion    Gallstones    GERD (gastroesophageal reflux disease)    Heart murmur    HEMORRHOIDS, INTERNAL 04/19/2008   Qualifier: Diagnosis of  By: Claudene GUSS Railing     Hyperlipidemia    Hypertension    HYPERTENSION 04/19/2008   Qualifier: Diagnosis of  By: Claudene GUSS Railing     Hypothyroidism    HYPOTHYROIDISM 04/19/2008   Qualifier: Diagnosis of  By: Claudene GUSS Railing     Joint pain    Osteoarthritis    Overweight    Pre-diabetes    Sleep apnea    cpap   SOB (shortness of breath)    Spondylosis    Vitamin B 12 deficiency    Vitamin D  deficiency    Past Surgical History:  Procedure Laterality Date   CESAREAN SECTION  1983, 1984   COLONOSCOPY     KNEE SURGERY Right 03/22/2008   TOTAL KNEE ARTHROPLASTY Left 12/01/2020   Procedure: TOTAL KNEE ARTHROPLASTY;  Surgeon: Melodi Lerner, MD;  Location: [facility];  Service: Orthopedics;  Laterality: Left;    WISDOM TOOTH EXTRACTION  03/22/1997   Patient Active Problem List   Diagnosis Date Noted   Obesity, unspecified 11/03/2023   Prediabetes 09/19/2023   OSA (obstructive sleep apnea) - mild on CPAP  09/19/2023   Pure hypercholesterolemia 09/19/2023   Anemia 09/19/2023   Daytime somnolence 09/19/2023   Vitamin D  deficiency 09/19/2023   OA (osteoarthritis) of knee 12/01/2020   Dyspnea on exertion 11/17/2017   CHOLELITHIASIS 05/10/2008   CONSTIPATION 04/23/2008   Hypothyroidism 04/19/2008   Essential hypertension 04/19/2008   HEMORRHOIDS, INTERNAL 04/19/2008    PCP: Rexanne Ingle, MD  REFERRING PROVIDER: Burnetta Aures, MD  REFERRING DIAG: M54.50 (ICD-10-CM) - Low back pain, unspecified   Rationale for Evaluation and Treatment: Rehabilitation  THERAPY DIAG:  Other low back pain  Muscle weakness (generalized)  Other abnormalities of gait and mobility  ONSET DATE: Chronic  SUBJECTIVE:  SUBJECTIVE STATEMENT: Pt reports that she is doing fairly well today.  Still having difficulty with standing for longer periods.  EVAL: Pt presents to PT with reports of chronic lower back pain with referral into LE R>L. Notes pain increases with prolonged standing and walking. Some N/T in bilateral LE also R>L. Does aquatic exercise and notes this seem sto help her discomfort some too. Denies bowel/bladder changes or saddle anesthesia.   PERTINENT HISTORY:  HTN: also bilat knee replacements   PAIN:  Are you having pain?  Yes: NPRS scale: 2/10 Worst: 4/10 Pain location: lower back,R LE Pain description: Tired Aggravating factors: standing, walking Relieving factors: rest, medication  PRECAUTIONS: Spondylolisthesis   RED FLAGS: None   WEIGHT BEARING RESTRICTIONS: No  FALLS:  Has patient fallen in last 6 months? No  LIVING ENVIRONMENT: Lives with: lives alone Lives in: House/apartment Stairs: No Has following equipment at home: Single point cane  OCCUPATION: Retired  PLOF:  Independent  PATIENT GOALS: Pt wants to decrease back pain and improve comfort with standing and walking  OBJECTIVE:  Note: Objective measures were completed at Evaluation unless otherwise noted.  DIAGNOSTIC FINDINGS: See imaging  PATIENT SURVEYS:  ODI: 18/50 - 36% disability  COGNITION: Overall cognitive status: Within functional limits for tasks assessed     SENSATION: [facility]  MUSCLE LENGTH: Thomas test: Right (+); Left (+)  POSTURE: rounded shoulders, forward head, and increased lumbar lordosis  PALPATION: TTP to lumbar paraspinals and R gluteals   LUMBAR ROM:   AROM eval  Flexion 50%  Extension 25%  Right lateral flexion   Left lateral flexion   Right rotation   Left rotation    (Blank rows = not tested)  LOWER EXTREMITY MMT:    MMT Right eval Left eval  Hip flexion 3+ 3+  Hip extension    Hip abduction 3+ 3+  Hip adduction    Hip internal rotation    Hip external rotation    Knee flexion 3+ 3+  Knee extension 3+ 3+  Ankle dorsiflexion    Ankle plantarflexion    Ankle inversion    Ankle eversion     (Blank rows = not tested)  LUMBAR SPECIAL TESTS:  Straight leg raise test: Negative and Slump test: Negative  FUNCTIONAL TESTS:  30 Second Sit to Stand: 12 reps with UE  GAIT: Distance walked: 8ft Assistive device utilized: None Level of assistance: Complete Independence Comments: flexed trunk  TREATMENT:  [facility] Adult PT Treatment  12/05/2023:   Therapeutic Exercise  LTR - some cramping reported Supine PPT with ball 1x10 - 5'' Pilates SLR 2x7 Supine clam - black - 2x10 Bridge on ball  2x10 - small arc  Supine hamstring stretch 30 sec ea 2x  Therapeutic Activity STS from raised table - 10# - 2x10 Step ups 6 in x10 ea Lateral Step ups 6 in x10 ea  Neuromuscular re-ed: Semi tandem stance Narrow base walking at counter Lateral walking on airex beam  [facility] Adult PT Treatment:                                                DATE:  11/30/23 Aquatic therapy at [facility] [address] - therapeutic pool temp 92 degrees Pt enters building independently.  Treatment took place in water  3.8 to  4 ft 8 in.feet deep depending upon activity.  Pt entered  and exited the pool via stair and handrails    Aquatic Therapy: Water  walking for warm up fwd/lat/bkwds High march with opposite weight tap Suitcase carry Blue DB Walking L stretch 5 hold x10 SL RDL with white barbell to pool wall x10 BIL W/ hip openers Deep water  jogging x3' Noodle stomp AMRAP - 1' ea Jump lunges Hip flexor stretch at steps   Pt requires the buoyancy of water  for active assisted exercises with buoyancy supported for strengthening and AROM exercises. Hydrostatic pressure also supports joints by unweighting joint load by at least 50 % in 3-4 feet depth water . 80% in chest to neck deep water . Water  will provide assistance with movement using the current and laminar flow while the buoyancy reduces weight bearing. Pt requires the viscosity of the water  for resistance with strengthening exercises.  [facility] Adult PT Treatment  11/29/2023:  Therapeutic Exercise:  Therapeutic Exercise  LTR - some cramping reported Supine PPT with ball 1x10  Pilates SLR 1x10 ea Supine clam - black - 2x10 Bridge on ball  2x10 - small arc  Supine hamstring stretch 30 sec ea  Therapeutic Activity Step ups 6 in x10 ea Lateral Step ups 6 in x10 ea Standing paloff press - 2x GTB - 2x10 ea Goal assessment  Neuromuscular re-ed: Semi tandem stance    [facility] Adult PT Treatment:                                                DATE: 10/21/23 Aquatic therapy at [facility] [address] - therapeutic pool temp 92 degrees Pt enters building independently.  Treatment took place in water  3.8 to  4 ft 8 in.feet deep depending upon activity.  Pt entered and exited the pool via stair and handrails    Aquatic Therapy: Water  walking for warm up fwd/lat/bkwds Farmers carry yellow  DB Suitcase carry yellow DB Walking marches yellow DB by sides x2 laps L stretch 5 hold x10 SL RDL with white barbell to pool wall x10 BIL Deep water  jogging x3' Deep water  jumping jacks x3' Noodle stomp x20 BIL Water  bell - shoulder flex/ext - narrow bos Alternating  Walking lunges Tandem walking Reverse Step ups x10 BIL HS stretch at step   Pt requires the buoyancy of water  for active assisted exercises with buoyancy supported for strengthening and AROM exercises. Hydrostatic pressure also supports joints by unweighting joint load by at least 50 % in 3-4 feet depth water . 80% in chest to neck deep water . Water  will provide assistance with movement using the current and laminar flow while the buoyancy reduces weight bearing. Pt requires the viscosity of the water  for resistance with strengthening exercises.   TREATMENT 10/13/23:  Aquatic therapy at [facility] [address] - therapeutic pool temp 92 degrees Pt enters building independently.  Treatment took place in water  3.8 to  4 ft 8 in.feet deep depending upon activity.  Pt entered and exited the pool via stair and handrails    Aquatic Therapy:  Water  walking for warm up fwd/lat/bkwds  L stretch Farmers carry Suitcase carry SL RDL with white barbell to pool wall Step up with march Deep water  jogging Deep water  jumping jack HS stretch at step Slow march with water  bells Noodle stomp Water  bell - shoulder flex/ext - narrow bos Alternating  Tandem walking reverse Step up fwd and lat with march Walking  lunges -    Pt requires the buoyancy of water  for active assisted exercises with buoyancy supported for strengthening and AROM exercises. Hydrostatic pressure also supports joints by unweighting joint load by at least 50 % in 3-4 feet depth water . 80% in chest to neck deep water . Water  will provide assistance with movement using the current and laminar flow while the buoyancy reduces weight bearing. Pt requires the  viscosity of the water  for resistance with strengthening exercises.  [facility] Adult PT Treatment:                                                DATE: 10/10/23 Therapeutic Exercise Nustep level 5 UE/LE x 4 minutes for functional activity tolerance Supine PPT with ball 1x10 disc. d/t cramping bilateral hamstrings  Pilates SLR 1x10 ea S/L hip abd x 8 ea Bridge 1x10 - disc. d/t cramping bilateral hamstrings  Supine hamstring stretch 30 sec ea Repeated lumbar flex with swiss ball in sitting 2x10  Standing hip abd/ext 1x10 ea Therapeutic Activity STS table 2x10 - 1 set high 1 low Standing hip flexion to step - 3# ankle weight 2x10 each  Step ups 4in x10 ea Lateral Step ups 4in 10 ea      PATIENT EDUCATION:  Education details: eval findings, ODI, HEP, POC Person educated: Patient Education method: Explanation, Demonstration, and Handouts Education comprehension: verbalized understanding and returned demonstration  HOME EXERCISE PROGRAM: Access Code: [email or web address] URL: [email or web address] Date: 11/29/2023 Prepared by: Helene Gasmen  Exercises - Modified Thomas Stretch  - 1 x daily - 7 x weekly - 2 reps - 30 sec hold - Supine Posterior Pelvic Tilt  - 1 x daily - 7 x weekly - 2 sets - 10 reps - 5 sec hold - Active Straight Leg Raise with Quad Set  - 1 x daily - 7 x weekly - 2 sets - 10 reps - Supine Lower Trunk Rotation  - 1 x daily - 7 x weekly - 10 reps - Hooklying Clamshell with Resistance  - 1 x daily - 7 x weekly - 2-3 sets - 10 reps - green band hold - Standing Hip Abduction with Counter Support  - 1 x daily - 7 x weekly - 2 sets - 10 reps - Standing Hip Extension with Counter Support  - 1 x daily - 7 x weekly - 2 sets - 10 reps - Sit to Stand Without Arm Support  - 1 x daily - 4-7 x weekly - 2 sets - 10 reps - Semi-Tandem Balance at [employer] Eyes Open  - 1 x daily - 7 x weekly - 1 sets - 3 reps - 30 seconds hold  ASSESSMENT:  CLINICAL IMPRESSION: Juliza  tolerated session well with no adverse reaction.  Working on progressing volume and intensity of standing and mat exercises with pt noting fatigue but no increase in pain.  More tolerant of mat exercises today with reduced R LE cramping with exception of LTR.  Range reduced with LTR which eliminated cramping.  EVAL: Patient is a 68 y.o. F who was seen today for physical therapy evaluation and treatment for chronic LBP with referral into bilateral LE R>L. Physical findings are consistent with MD impression as pt demonstrates decrease in core and proximal hip strength and functional mobility. ODI score shows moderate disability in performance of home ADLs and community activities.  Pt would benefit from skilled PT services working on improving core and proximal hip strength in order to decrease pain in gym and aquatic environments.   OBJECTIVE IMPAIRMENTS: Abnormal gait, decreased activity tolerance, decreased mobility, difficulty walking, decreased ROM, decreased strength, postural dysfunction, and pain   ACTIVITY LIMITATIONS: carrying, lifting, standing, squatting, stairs, transfers, and locomotion level  PARTICIPATION LIMITATIONS: meal prep, cleaning, driving, shopping, community activity, occupation, and yard work  PERSONAL FACTORS: Time since onset of injury/illness/exacerbation and 1-2 comorbidities: HTN are also affecting patient's functional outcome.   REHAB POTENTIAL: Good  CLINICAL DECISION MAKING: Evolving/moderate complexity  EVALUATION COMPLEXITY: Moderate   GOALS: Goals reviewed with patient? No  SHORT TERM GOALS: Target date: 09/29/2023   Pt will be compliant and knowledgeable with initial HEP for improved comfort and carryover Baseline: initial HEP given  Goal status: MET  2.  Pt will self report low back pain no greater than 5/10 for improved comfort and functional ability Baseline: 7/10 at worst 10/03/2023: 2/10 at worst Goal status: MET   LONG TERM GOALS: Target date:  11/03/2023 extended to 01/24/2024    Pt will be decrease ODI disability score to no greater than 25% (12/50) as proxy for functional improvement Baseline: 36% disability - 18/50  10/03/2023: 28% disability - 14/50 8/14: 15/50 Goal status: IN PROGRESS  2.  Pt will self report low back pain no greater than 3/10 for improved comfort and functional ability Baseline: 7/10 at worst 10/21/23: 5/10 at highest  9/9: 4/10 Goal status: Progressing  3.  Pt will increase 30 Second Sit to Stand rep count to no less than 14 reps for improved balance, strength, and functional mobility Baseline: 12 reps with UE 10/03/2023: 13 reps no UE 9/9: 11 no UE support  Goal status: Ongoing   4.  Pt will improve standing and walking activity tolerance to no less than 30-45 minutes for improved comfort and functional ability with community activities  Baseline: 15 minutes 9/9: 15 min Goal status: Ongoing   PLAN:  PT FREQUENCY: 2x/week  PT DURATION: 8 weeks  PLANNED INTERVENTIONS: 97164- PT Re-evaluation, 97110-Therapeutic exercises, 97530- Therapeutic activity, 97112- Neuromuscular re-education, 97535- Self Care, 02859- Manual therapy, [id number]- Gait training, [phone]- Aquatic Therapy, [phone]- Electrical stimulation (unattended), [phone]- Electrical stimulation (manual), 20560 (1-2 muscles), 20561 (3+ muscles)- Dry Needling, Cryotherapy, and Moist heat  PLAN FOR NEXT SESSION: assess HEP response, neutral spine core strengthening, hip flexor stretching    Helene FORBES Gasmen PT 12/05/23 3:49 PM  Date of referral: 08/24/2023 Referring provider: Burnetta Aures, MD Referring diagnosis? M54.50 (ICD-10-CM) - Low back pain, unspecified  Treatment diagnosis? (if different than referring diagnosis) Other low back pain Muscle weakness (generalized) Other abnormalities of gait and mobility   What was this (referring dx) caused by? Arthritis   Nature of Condition: Chronic (continuous duration > 3 months)              Laterality: Both   Current Functional Measure Score:  ODI: 15/50 disability  Objective measurements identify impairments when they are compared to normal values, the uninvolved extremity, and prior level of function.  [x]  Yes  []  No  Objective assessment of functional ability: Moderate functional limitations   Briefly describe symptoms: fatigue and pain which limits standing and walking activities to 15 min  How did symptoms start: chonic  Average pain intensity:  Last 24 hours: 4  Past week: 4  How often does the pt experience symptoms? Frequently  How much have the symptoms interfered with usual  daily activities? Moderately  How has condition changed since care began at this facility? A little better  In general, how is the patients overall health? Good   BACK PAIN (STarT Back Screening Tool) Has pain spread down the leg(s) at some time in the last 2 weeks? n Has there been pain in the shoulder or neck at some time in the last 2 weeks? n Has the pt only walked short distances because of back pain? y Has patient dressed more slowly because of back pain in the past 2 weeks? y Does patient think it's not safe for a person with this condition to be physically active? n Does patient have worrying thoughts a lot of the time? n Does patient feel back pain is terrible and will never get any better? n Has patient stopped enjoying things they usually enjoy? y

## 2023-12-06 ENCOUNTER — Ambulatory Visit: Payer: Medicare Other | Admitting: Dermatology

## 2023-12-06 ENCOUNTER — Encounter: Payer: Self-pay | Admitting: Dermatology

## 2023-12-06 VITALS — BP 135/73

## 2023-12-06 DIAGNOSIS — L6681 Central centrifugal cicatricial alopecia: Secondary | ICD-10-CM | POA: Diagnosis not present

## 2023-12-06 DIAGNOSIS — L6612 Frontal fibrosing alopecia: Secondary | ICD-10-CM | POA: Diagnosis not present

## 2023-12-06 MED ORDER — SAFETY SEAL MISCELLANEOUS MISC: 5 refills | Status: AC

## 2023-12-06 NOTE — Patient Instructions (Addendum)
 Date: Tue Dec 06 2023  Hello Makhayla,  Thank you for visiting today. 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  Warm regards,  Dr. Delon Lenis [facility]at[facility]        Important Information  Due to recent changes in healthcare laws, you may see results of your pathology and/or laboratory studies on MyChart before the doctors have had a chance to review them. We understand that in some cases there may be results that are confusing or concerning to you. Please understand that not all results are received at the same time and often the doctors may need to interpret multiple results in order to provide you with the best plan of care or course of treatment. Therefore, we ask that you please give us  2 business days to thoroughly review all your results before contacting the office for clarification. Should we see a critical lab result, you will be contacted sooner.   If You Need Anything After Your Visit  If you have any questions or concerns for your doctor, please call our main line at [phone] If no one answers, please leave a voicemail as directed and we will return your call as soon as possible. Messages left after 4 pm will be answered the following business day.   You may also send us  a message via MyChart. We typically respond to MyChart messages within 1-2 business days.  For prescription refills, please ask your pharmacy to contact our office. Our fax number is [phone].  If you have an  urgent issue when the clinic is closed that cannot wait until the next business day, you can page your doctor at the number below.    Please note that while we do our best to be available for urgent issues outside of office hours, we are not available 24/7.   If you have an urgent issue and are unable to reach us , you may choose to seek medical care at your doctor's office, retail clinic, urgent care center, or emergency room.  If you have a medical emergency, please immediately call 911 or go to the emergency department. In the event of inclement weather, please call our main line at [phone] for an update on the status of any delays or closures.  Dermatology Medication Tips: Please keep the boxes that topical medications come in in order to help keep track of the instructions about where and how to use these. Pharmacies typically print the medication instructions only on the boxes and not directly on the medication tubes.   If your medication is too expensive, please contact our office at [phone] or send us  a message through MyChart.   We are unable to tell what your co-pay for medications will be in advance as this is different depending on your insurance coverage. However, we may be able to find a substitute medication at lower cost or fill out paperwork to get insurance to cover a needed medication.   If a prior authorization is required to  get your medication covered by your insurance company, please allow us  1-2 business days to complete this process.  Drug prices often vary depending on where the prescription is filled and some pharmacies may offer cheaper prices.  The website www.goodrx.com contains coupons for medications through different pharmacies. The prices here do not account for what the cost may be with help from insurance (it may be cheaper with your insurance), but the website can give you the price if you did not use any insurance.  - You can print the associated  coupon and take it with your prescription to the pharmacy.  - You may also stop by our office during regular business hours and pick up a GoodRx coupon card.  - If you need your prescription sent electronically to a different pharmacy, notify our office through [facility] or by phone at [phone]

## 2023-12-06 NOTE — Progress Notes (Signed)
   New Patient Visit   Subjective  Ashley York is a 68 y.o. female who presents for a NEW PATIENT appointment to be examined for the concerns as listed below.   Hair Loss: Patient stated she noticed hair loss 8 years ago at the hair line. She was previously seeing Dr. Milissa who done injections years ago but she did not follow up regularly and resorted in trying OTC minoxidil products and other products made for hair loss that were ineffective. Pt does not have Hx of relaxers or tight hair styles but does recall once having braids that were too tight. She notes that her mother and sister who experienced hair loss.     Are you nursing, pregnant or trying to conceive? No   No Hx of Bx.  No family Hx of skin cancer.   The following portions of the chart were reviewed this encounter and updated as appropriate: medications, allergies, medical history  Review of Systems:  No other skin or systemic complaints except as noted in HPI or Assessment and Plan.  Objective  Well appearing patient in no apparent distress; mood and affect are within normal limits.   A focused examination was performed of the following areas: scalp   Relevant exam findings are noted in the Assessment and Plan.                Assessment & Plan   1. 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 FFA is characterized by regression of the frontal hairline with loss of follicular ostia and eyebrows. CCCA presents with thinning on the vertex of the scalp, hyperpigmentation, and loss of follicular ostia. Both conditions are caused by inflammation in the scalp, which, if left untreated, leads to permanent scarring of hair follicles. The patient previously received steroid injections and topical clobetasol , but discontinued treatment. Current examination reveals progression of hair loss compared to  previous visits, with no reported symptoms of itching or burning. - Plan:    Prescribe combination topical medication from [facility] with clobetasol  and minoxidil    Apply once daily in the morning    Recommend Vivoscal 2 tablets daily    Recommend collagen powder for skin, hair, and nails    Schedule follow-up in 3 months for progress photos    Reassess in 6 months for potential steroid injections if topical treatment is ineffective    Educate patient on treatment goals and expected timeline for results    Discuss potential future consideration of PRP treatment for CCCA area if condition stabilizes for at least one year  Follow-up in 3 months for progress photos.   Return in about 6 months (around 06/04/2024) for ALOPECIA.   Documentation: I have reviewed the above documentation for accuracy and completeness, and I agree with the above.  I, Shirron Maranda, CMA, am acting as scribe for [employer], DO.   Delon Lenis, DO

## 2023-12-07 ENCOUNTER — Encounter (INDEPENDENT_AMBULATORY_CARE_PROVIDER_SITE_OTHER): Payer: Self-pay | Admitting: Internal Medicine

## 2023-12-07 ENCOUNTER — Ambulatory Visit (INDEPENDENT_AMBULATORY_CARE_PROVIDER_SITE_OTHER): Admitting: Internal Medicine

## 2023-12-07 VITALS — BP 135/68 | HR 75 | Temp 98.7°F | Ht 65.5 in | Wt 247.0 lb

## 2023-12-07 DIAGNOSIS — R5383 Other fatigue: Secondary | ICD-10-CM

## 2023-12-07 DIAGNOSIS — E66813 Obesity, class 3: Secondary | ICD-10-CM

## 2023-12-07 DIAGNOSIS — I10 Essential (primary) hypertension: Secondary | ICD-10-CM | POA: Diagnosis not present

## 2023-12-07 DIAGNOSIS — Z6841 Body Mass Index (BMI) 40.0 and over, adult: Secondary | ICD-10-CM

## 2023-12-07 DIAGNOSIS — R7303 Prediabetes: Secondary | ICD-10-CM | POA: Diagnosis not present

## 2023-12-07 DIAGNOSIS — G4733 Obstructive sleep apnea (adult) (pediatric): Secondary | ICD-10-CM

## 2023-12-07 MED ORDER — RELION MINI PEN NEEDLES 31G X 6 MM MISC: 1.0000 | 0 refills | Status: AC

## 2023-12-07 NOTE — Assessment & Plan Note (Signed)
 Experiencing fatigue likely due to reduced caloric intake and significant appetite suppression from semaglutide. - Ensure adequate caloric intake by consuming three balanced meals a day. - Monitor energy levels and adjust diet as needed to prevent fatigue.

## 2023-12-07 NOTE — Progress Notes (Signed)
 Office: [phone]  /  Fax: [phone]  Weight Summary and Body Composition Analysis (BIA)  Vitals Temp: 98.7 F (37.1 C) BP: 135/68 Pulse Rate: 75 SpO2: 100 %   Anthropometric Measurements Height: 5' 5.5 (1.664 m) Weight: 247 lb (112 kg) BMI (Calculated): 40.46 Weight at Last Visit: 258 lb Weight Lost Since Last Visit: 11 lb Weight Gained Since Last Visit: 0 lb Starting Weight: 263 lb Total Weight Loss (lbs): 16 lb (7.258 kg) Peak Weight: 270 lb   Body Composition  Body Fat %: 49.4 % Fat Mass (lbs): 122.4 lbs Muscle Mass (lbs): 118.8 lbs Total Body Water  (lbs): 89.4 lbs Visceral Fat Rating : 17    RMR: 1627  Today's Visit #: 4  Starting Date: 09/19/23   Subjective   Chief Complaint: Obesity  Interval History Discussed the use of AI scribe software for clinical note transcription with the patient, who gave verbal consent to proceed.  History of Present Illness Ashley York is a 68 year old female who presents for medical weight management.  Since her last visit, she has lost eleven pounds. She follows a twelve hundred calorie nutrition plan about ninety percent of the time, does not track her intake, eats more whole foods, maintains adequate hydration, but acknowledges skipping meals. She exercises once a week for sixty minutes, primarily doing water  aerobics.  She has been using an Ozempic pen, administering 0.5 mg by using nineteen clicks of a 2 mg pen. This dosage has significantly reduced her appetite, allowing her to lose weight, but also causing her to skip meals. She experiences mild nausea with the medication but no vomiting. She tries to ensure she eats at least two meals a day, focusing on protein intake, although she admits to not following her recipes as diligently in the past month due to her son's upcoming wedding.  Her body composition has changed, with a reduction in body fat from 52.7% to 49.4%. She feels tired, which she attributes  to not eating enough and being busy with wedding preparations.  Her blood pressure was noted to be slightly elevated at 139/85. She takes her blood pressure medications, losartan , hydrochlorothiazide , and amlodipine , at night. She has a blood pressure machine at home.     Challenges affecting patient progress: low volume of physical activity at present  and menopause.    Pharmacotherapy for weight management: She is currently taking Ozempic with prediabetes or insulin  resistance as the primary indication with adequate clinical response  and experiencing the following side effects: nausea and fatigue .Patient did not respond or had adverse reaction to metformin in the past..   Assessment and Plan   Treatment Plan For Obesity:  Recommended Dietary Goals  Ashley York is currently in the action stage of change. As such, her goal is to continue weight management plan. She has agreed to: continue current plan  Behavioral Health and Counseling  We discussed the following behavioral modification strategies today: continue to work on maintaining a reduced calorie state, getting the recommended amount of protein, incorporating whole foods, making healthy choices, staying well hydrated and practicing mindfulness when eating. and increase protein intake, fibrous foods (25 grams per day for women, 30 grams for men) and water  to improve satiety and decrease hunger signals. .  Additional education and resources provided today: None  Recommended Physical Activity Goals  Ashley York has been advised to work up to 150 minutes of moderate intensity aerobic activity a week and strengthening exercises 2-3 times per week for cardiovascular health,  weight loss maintenance and preservation of muscle mass.  She has agreed to :  Think about enjoyable ways to increase daily physical activity and overcoming barriers to exercise, Increase physical activity in their day and reduce sedentary time (increase NEAT).,  Increase volume of physical activity to a goal of 240 minutes a week, and Combine aerobic and strengthening exercises for efficiency and improved cardiometabolic health.  Medical Interventions and Pharmacotherapy  We discussed various medication options to help Ashley York with her weight loss efforts and we both agreed to : Reduce ozempic to .25 mg once a week  Associated Conditions Impacted by Obesity Treatment  Assessment & Plan Prediabetes Most recent A1c is  Lab Results  Component Value Date   HGBA1C 6.2 (H) 09/19/2023   HGBA1C 6.0 (H) 11/19/2020    Patient aware of disease state and risk of progression. This may contribute to abnormal cravings, fatigue and diabetic complications without having diabetes.   We have discussed treatment options which include: losing 7 to 10% of body weight, increasing physical activity to a goal of 150 minutes a week at moderate intensity.  Advised to maintain a diet low on simple and processed carbohydrates.  Educated on the carb insulin  model for obesity  She had side effects to metformin in the past  She is now on Ozempic medication will be adjusted to 0.25 mg once a day.  OSA (obstructive sleep apnea) - mild on CPAP Mild on PAP.  Losing 15% of body weight may reduce AHI.  Continue with current weight management strategy Essential hypertension Blood pressure close to goal and improved.  On amlodipine  and ARB hydrochlorothiazide  without adverse effects.    Most recent renal parameters reviewed and within normal limits.  Losing 10% of body weight may improve blood pressure control.  Continue current regimen  Anticipate further improvement in blood pressure control she continues to lose weight  Class 3 severe obesity with serious comorbidity and body mass index (BMI) of 40.0 to 44.9 in adult, unspecified obesity type Obesity is being managed with semaglutide and a 1200 calorie nutrition plan. She has lost 11 pounds since the last visit, with a  reduction in body fat from 52.7% to 49.4% and a gain in muscle mass. Currently taking 0.5 mg of semaglutide, which has significantly suppressed her appetite, leading to concerns about inadequate nutrition and potential muscle loss. - Reduce semaglutide dose to 0.25 mg to moderate appetite suppression and ensure adequate caloric intake. - Encourage consumption of three meals a day with adequate protein intake. - Emphasize the importance of maintaining a balanced diet with fruits, vegetables, and whole foods. - Encourage regular physical activity, including strengthening exercises, to prevent muscle loss. Other fatigue Experiencing fatigue likely due to reduced caloric intake and significant appetite suppression from semaglutide. - Ensure adequate caloric intake by consuming three balanced meals a day. - Monitor energy levels and adjust diet as needed to prevent fatigue.         Objective   Physical Exam:  Blood pressure 135/68, pulse 75, temperature 98.7 F (37.1 C), height 5' 5.5 (1.664 m), weight 247 lb (112 kg), SpO2 100%. Body mass index is 40.48 kg/m.  General: She is overweight, cooperative, alert, well developed, and in no acute distress. PSYCH: Has normal mood, affect and thought process.   HEENT: EOMI, sclerae are anicteric. Lungs: Normal breathing effort, no conversational dyspnea. Extremities: No edema.  Neurologic: No gross sensory or motor deficits. No tremors or fasciculations noted.    Diagnostic Data Reviewed:  BMET    Component Value Date/Time   NA 142 09/19/2023 0942   K 4.1 09/19/2023 0942   CL 103 09/19/2023 0942   CO2 24 09/19/2023 0942   GLUCOSE 93 09/19/2023 0942   GLUCOSE 158 (H) 12/02/2020 0304   BUN 19 09/19/2023 0942   CREATININE 0.86 09/19/2023 0942   CALCIUM  9.6 09/19/2023 0942   GFRNONAA >60 12/02/2020 0304   GFRAA  10/24/2008 1246    >60        The eGFR has been calculated using the MDRD equation. This calculation has not  been validated in all clinical situations. eGFR's persistently <60 mL/min signify possible Chronic Kidney Disease.   Lab Results  Component Value Date   HGBA1C 6.2 (H) 09/19/2023   HGBA1C 6.0 (H) 11/19/2020   Lab Results  Component Value Date   INSULIN  9.7 09/19/2023   Lab Results  Component Value Date   TSH 2.280 09/19/2023   CBC    Component Value Date/Time   WBC 6.6 09/19/2023 0942   WBC 12.1 (H) 12/02/2020 0304   RBC 4.58 09/19/2023 0942   RBC 3.72 (L) 12/02/2020 0304   HGB 11.8 09/19/2023 0942   HCT 39.4 09/19/2023 0942   PLT 239 09/19/2023 0942   MCV 86 09/19/2023 0942   MCH 25.8 (L) 09/19/2023 0942   MCH 26.1 12/02/2020 0304   MCHC 29.9 (L) 09/19/2023 0942   MCHC 31.3 12/02/2020 0304   RDW 13.5 09/19/2023 0942   Iron Studies    Component Value Date/Time   IRON 48 09/19/2023 0942   TIBC 329 09/19/2023 0942   FERRITIN 45 09/19/2023 0942   IRONPCTSAT 15 09/19/2023 0942   Lipid Panel     Component Value Date/Time   CHOL 169 09/19/2023 0942   TRIG 83 09/19/2023 0942   HDL 71 09/19/2023 0942   LDLCALC 83 09/19/2023 0942   Hepatic Function Panel     Component Value Date/Time   PROT 7.3 09/19/2023 0942   ALBUMIN 4.3 09/19/2023 0942   AST 24 09/19/2023 0942   ALT 19 09/19/2023 0942   ALKPHOS 112 09/19/2023 0942   BILITOT 0.3 09/19/2023 0942   BILIDIR <0.10 01/12/2023 1501      Component Value Date/Time   TSH 2.280 09/19/2023 0942   Nutritional Lab Results  Component Value Date   VD25OH 54.7 09/19/2023    Medications: Outpatient Encounter Medications as of 12/07/2023  Medication Sig   Accu-Chek Softclix Lancets lancets Use to check your blood sugar finger stick once a day Dx Code E11.9 for 90 days   amLODipine  (NORVASC ) 10 MG tablet Take 10 mg by mouth daily.   atorvastatin  (LIPITOR) 20 MG tablet Take 20 mg by mouth daily.   Blood Glucose Monitoring Suppl (ACCU-CHEK GUIDE) w/Device KIT as directed to check blood sugar once a day Dx Code  E11.9   cholecalciferol (VITAMIN D ) 25 MCG (1000 UNIT) tablet Take 1,000 Units by mouth daily.   Coenzyme Q10 (COQ10) 100 MG CAPS Take 100 mg by mouth daily.   Cyanocobalamin  (B-12 PO) Take 1 tablet by mouth daily.   Insulin  Pen Needle (RELION MINI PEN NEEDLES) 31G X 6 MM MISC Inject 1 Needle into the skin once a week.   levothyroxine  (SYNTHROID ) 150 MCG tablet Take 150 mcg by mouth daily before breakfast.   liothyronine (CYTOMEL) 5 MCG tablet Take 5 mcg by mouth daily.   losartan -hydrochlorothiazide  (HYZAAR) 100-25 MG tablet Take 1 tablet by mouth daily.    Magnesium  250 MG TABS Take  250 mg by mouth in the morning and at bedtime.   meloxicam (MOBIC) 15 MG tablet Take 1 tablet by mouth daily.   Omega-3 Fatty Acids (FISH OIL) 1000 MG CAPS 1 capsule Orally Once a day for 30 day(s)   Safety Seal Miscellaneous MISC Rx Medrock Minoxidil 7% Clobetasol  0.05% solution - apply to the affected areas of the scalp QAM.   Semaglutide,0.25 or 0.5MG /DOS, (OZEMPIC, 0.25 OR 0.5 MG/DOSE,) 2 MG/1.5ML SOPN Inject 0.25 mg into the skin once a week.   zinc gluconate 50 MG tablet Take 50 mg by mouth daily.   [DISCONTINUED] Semaglutide, 1 MG/DOSE, 4 MG/3ML SOPN Inject 0.5 mg into the skin once a week.   No facility-administered encounter medications on file as of 12/07/2023.     Follow-Up   Return in about 4 weeks (around 01/04/2024) for For Weight Mangement with Dr. Francyne.Ashley York She was informed of the importance of frequent follow up visits to maximize her success with intensive lifestyle modifications for her multiple health conditions.  Attestation Statement   Reviewed by clinician on day of visit: allergies, medications, problem list, medical history, surgical history, family history, social history, and previous encounter notes.     Lucas Francyne, MD

## 2023-12-07 NOTE — Assessment & Plan Note (Signed)
 Mild on PAP.  Losing 15% of body weight may reduce AHI.  Continue with current weight management strategy

## 2023-12-07 NOTE — Assessment & Plan Note (Signed)
 Blood pressure close to goal and improved.  On amlodipine  and ARB hydrochlorothiazide  without adverse effects.    Most recent renal parameters reviewed and within normal limits.  Losing 10% of body weight may improve blood pressure control.  Continue current regimen  Anticipate further improvement in blood pressure control she continues to lose weight

## 2023-12-07 NOTE — Assessment & Plan Note (Signed)
 Obesity is being managed with semaglutide and a 1200 calorie nutrition plan. She has lost 11 pounds since the last visit, with a reduction in body fat from 52.7% to 49.4% and a gain in muscle mass. Currently taking 0.5 mg of semaglutide, which has significantly suppressed her appetite, leading to concerns about inadequate nutrition and potential muscle loss. - Reduce semaglutide dose to 0.25 mg to moderate appetite suppression and ensure adequate caloric intake. - Encourage consumption of three meals a day with adequate protein intake. - Emphasize the importance of maintaining a balanced diet with fruits, vegetables, and whole foods. - Encourage regular physical activity, including strengthening exercises, to prevent muscle loss.

## 2023-12-07 NOTE — Assessment & Plan Note (Signed)
 Most recent A1c is  Lab Results  Component Value Date   HGBA1C 6.2 (H) 09/19/2023   HGBA1C 6.0 (H) 11/19/2020    Patient aware of disease state and risk of progression. This may contribute to abnormal cravings, fatigue and diabetic complications without having diabetes.   We have discussed treatment options which include: losing 7 to 10% of body weight, increasing physical activity to a goal of 150 minutes a week at moderate intensity.  Advised to maintain a diet low on simple and processed carbohydrates.  Educated on the carb insulin  model for obesity  She had side effects to metformin in the past  She is now on Ozempic medication will be adjusted to 0.25 mg once a day.

## 2023-12-08 DIAGNOSIS — M5416 Radiculopathy, lumbar region: Secondary | ICD-10-CM | POA: Diagnosis not present

## 2023-12-12 ENCOUNTER — Ambulatory Visit

## 2023-12-12 DIAGNOSIS — R2689 Other abnormalities of gait and mobility: Secondary | ICD-10-CM

## 2023-12-12 DIAGNOSIS — M5459 Other low back pain: Secondary | ICD-10-CM | POA: Diagnosis not present

## 2023-12-12 DIAGNOSIS — M6281 Muscle weakness (generalized): Secondary | ICD-10-CM | POA: Diagnosis not present

## 2023-12-12 NOTE — Therapy (Signed)
 OUTPATIENT PHYSICAL THERAPY TREATMENT   Patient Name: Ashley York MRN: [id number] DOB:February 10, 1956, 68 y.o., female Today's Date: 12/12/2023  END OF SESSION:  PT End of Session - 12/12/23 1014     Visit Number 14    Number of Visits 17    Date for Recertification  01/24/24    Authorization Type [employer] MCR    Authorization Time Period Submitted for 12 additional viists    PT Start Time 1015    PT Stop Time 1055    PT Time Calculation (min) 40 min    Activity Tolerance Patient tolerated treatment well    Behavior During Therapy [facility] for tasks assessed/performed           Past Medical History:  Diagnosis Date   Allergy    Back pain    CHOLELITHIASIS 05/10/2008   Qualifier: Diagnosis of  By: Tivis RN, Channing     CONSTIPATION 04/23/2008   Qualifier: Diagnosis of  By: Abran MD, Norleen SAILOR    Dyspnea on exertion    Gallstones    GERD (gastroesophageal reflux disease)    Heart murmur    HEMORRHOIDS, INTERNAL 04/19/2008   Qualifier: Diagnosis of  By: Claudene GUSS Railing     Hyperlipidemia    Hypertension    HYPERTENSION 04/19/2008   Qualifier: Diagnosis of  By: Claudene GUSS Railing     Hypothyroidism    HYPOTHYROIDISM 04/19/2008   Qualifier: Diagnosis of  By: Claudene GUSS Railing     Joint pain    Osteoarthritis    Overweight    Pre-diabetes    Sleep apnea    cpap   SOB (shortness of breath)    Spondylosis    Vitamin B 12 deficiency    Vitamin D  deficiency    Past Surgical History:  Procedure Laterality Date   CESAREAN SECTION  1983, 1984   COLONOSCOPY     KNEE SURGERY Right 03/22/2008   TOTAL KNEE ARTHROPLASTY Left 12/01/2020   Procedure: TOTAL KNEE ARTHROPLASTY;  Surgeon: Melodi Lerner, MD;  Location: [facility];  Service: Orthopedics;  Laterality: Left;    WISDOM TOOTH EXTRACTION  03/22/1997   Patient Active Problem List   Diagnosis Date Noted   Other fatigue 12/07/2023   Obesity, unspecified 11/03/2023   Prediabetes 09/19/2023   OSA (obstructive sleep  apnea) - mild on CPAP 09/19/2023   Pure hypercholesterolemia 09/19/2023   Anemia 09/19/2023   Daytime somnolence 09/19/2023   Vitamin D  deficiency 09/19/2023   OA (osteoarthritis) of knee 12/01/2020   Dyspnea on exertion 11/17/2017   CHOLELITHIASIS 05/10/2008   CONSTIPATION 04/23/2008   Hypothyroidism 04/19/2008   Essential hypertension 04/19/2008   HEMORRHOIDS, INTERNAL 04/19/2008    PCP: Rexanne Ingle, MD  REFERRING PROVIDER: Burnetta Aures, MD  REFERRING DIAG: M54.50 (ICD-10-CM) - Low back pain, unspecified   Rationale for Evaluation and Treatment: Rehabilitation  THERAPY DIAG:  Other low back pain  Muscle weakness (generalized)  Other abnormalities of gait and mobility  ONSET DATE: Chronic  SUBJECTIVE:  SUBJECTIVE STATEMENT: Pt presents to PT with no current pain. Will be standing a good bit this weekend for her son's wedding. Has been compliant with HEP.  EVAL: Pt presents to PT with reports of chronic lower back pain with referral into LE R>L. Notes pain increases with prolonged standing and walking. Some N/T in bilateral LE also R>L. Does aquatic exercise and notes this seem sto help her discomfort some too. Denies bowel/bladder changes or saddle anesthesia.   PERTINENT HISTORY:  HTN: also bilat knee replacements   PAIN:  Are you having pain?  Yes: NPRS scale: 2/10 Worst: 4/10 Pain location: lower back,R LE Pain description: Tired Aggravating factors: standing, walking Relieving factors: rest, medication  PRECAUTIONS: Spondylolisthesis   RED FLAGS: None   WEIGHT BEARING RESTRICTIONS: No  FALLS:  Has patient fallen in last 6 months? No  LIVING ENVIRONMENT: Lives with: lives alone Lives in: House/apartment Stairs: No Has following equipment at home: Single point  cane  OCCUPATION: Retired  PLOF: Independent  PATIENT GOALS: Pt wants to decrease back pain and improve comfort with standing and walking  OBJECTIVE:  Note: Objective measures were completed at Evaluation unless otherwise noted.  DIAGNOSTIC FINDINGS: See imaging  PATIENT SURVEYS:  ODI: 18/50 - 36% disability  COGNITION: Overall cognitive status: Within functional limits for tasks assessed     SENSATION: [facility]  MUSCLE LENGTH: Thomas test: Right (+); Left (+)  POSTURE: rounded shoulders, forward head, and increased lumbar lordosis  PALPATION: TTP to lumbar paraspinals and R gluteals   LUMBAR ROM:   AROM eval  Flexion 50%  Extension 25%  Right lateral flexion   Left lateral flexion   Right rotation   Left rotation    (Blank rows = not tested)  LOWER EXTREMITY MMT:    MMT Right eval Left eval  Hip flexion 3+ 3+  Hip extension    Hip abduction 3+ 3+  Hip adduction    Hip internal rotation    Hip external rotation    Knee flexion 3+ 3+  Knee extension 3+ 3+  Ankle dorsiflexion    Ankle plantarflexion    Ankle inversion    Ankle eversion     (Blank rows = not tested)  LUMBAR SPECIAL TESTS:  Straight leg raise test: Negative and Slump test: Negative  FUNCTIONAL TESTS:  30 Second Sit to Stand: 12 reps with UE  GAIT: Distance walked: 47ft Assistive device utilized: None Level of assistance: Complete Independence Comments: flexed trunk  TREATMENT: [facility] Adult PT Treatment  12/12/2023: Therapeutic Exercise Supine PPT x 10 - 5 hold Supine PPT with ball 2x10 - 5'' Pilates SLR 2x10 ea Bridge on ball  2x10 - small arc  DKTC with swiss ball x 10 Swiss ball repeated lumbar flexion x 15 Cybex leg press 3x10 60# Therapeutic Activity STS from low table - 10# - 2x10 Standing hip abd/ext 2x10 25# Step ups 8 in x 10 ea B UE support Functional squat 2x10 B UE support  [facility] Adult PT Treatment  12/05/2023: Therapeutic Exercise LTR - some cramping  reported Supine PPT with ball 1x10 - 5'' Pilates SLR 2x7 Supine clam - black - 2x10 Bridge on ball  2x10 - small arc  Supine hamstring stretch 30 sec ea 2x Therapeutic Activity STS from raised table - 10# - 2x10 Step ups 6 in x10 ea Lateral Step ups 6 in x10 ea Neuromuscular re-ed: Semi tandem stance Narrow base walking at counter Lateral walking on airex beam  [facility] Adult PT Treatment:  DATE: 11/30/23 Aquatic therapy at [facility]- [address] - therapeutic pool temp 92 degrees Pt enters building independently.  Treatment took place in water  3.8 to  4 ft 8 in.feet deep depending upon activity.  Pt entered and exited the pool via stair and handrails    Aquatic Therapy: Water  walking for warm up fwd/lat/bkwds High march with opposite weight tap Suitcase carry Blue DB Walking L stretch 5 hold x10 SL RDL with white barbell to pool wall x10 BIL W/ hip openers Deep water  jogging x3' Noodle stomp AMRAP - 1' ea Jump lunges Hip flexor stretch at steps   Pt requires the buoyancy of water  for active assisted exercises with buoyancy supported for strengthening and AROM exercises. Hydrostatic pressure also supports joints by unweighting joint load by at least 50 % in 3-4 feet depth water . 80% in chest to neck deep water . Water  will provide assistance with movement using the current and laminar flow while the buoyancy reduces weight bearing. Pt requires the viscosity of the water  for resistance with strengthening exercises.  [facility] Adult PT Treatment  11/29/2023:  Therapeutic Exercise:  Therapeutic Exercise  LTR - some cramping reported Supine PPT with ball 1x10  Pilates SLR 1x10 ea Supine clam - black - 2x10 Bridge on ball  2x10 - small arc  Supine hamstring stretch 30 sec ea  Therapeutic Activity Step ups 6 in x10 ea Lateral Step ups 6 in x10 ea Standing paloff press - 2x GTB - 2x10 ea Goal assessment Neuromuscular re-ed: Semi  tandem stance   PATIENT EDUCATION:  Education details: eval findings, ODI, HEP, POC Person educated: Patient Education method: Explanation, Demonstration, and Handouts Education comprehension: verbalized understanding and returned demonstration  HOME EXERCISE PROGRAM: Access Code: [email or web address] URL: [email or web address] Date: 11/29/2023 Prepared by: Helene Gasmen  Exercises - Modified Thomas Stretch  - 1 x daily - 7 x weekly - 2 reps - 30 sec hold - Supine Posterior Pelvic Tilt  - 1 x daily - 7 x weekly - 2 sets - 10 reps - 5 sec hold - Active Straight Leg Raise with Quad Set  - 1 x daily - 7 x weekly - 2 sets - 10 reps - Supine Lower Trunk Rotation  - 1 x daily - 7 x weekly - 10 reps - Hooklying Clamshell with Resistance  - 1 x daily - 7 x weekly - 2-3 sets - 10 reps - green band hold - Standing Hip Abduction with Counter Support  - 1 x daily - 7 x weekly - 2 sets - 10 reps - Standing Hip Extension with Counter Support  - 1 x daily - 7 x weekly - 2 sets - 10 reps - Sit to Stand Without Arm Support  - 1 x daily - 4-7 x weekly - 2 sets - 10 reps - Semi-Tandem Balance at [employer] Eyes Open  - 1 x daily - 7 x weekly - 1 sets - 3 reps - 30 seconds hold  ASSESSMENT:  CLINICAL IMPRESSION: Pt was able to complete all prescribed exercises with no adverse effect. Exercises today focused on core and proximal hip strengthening in order to decrease back pain as well as functional activities meant to improve general activity tolerance and improve functional movements. She continues to benefit from skilled PT services, will progress exercises as able per POC.   EVAL: Patient is a 68 y.o. F who was seen today for physical therapy evaluation and treatment for chronic LBP with referral into bilateral LE R>L. Physical  findings are consistent with MD impression as pt demonstrates decrease in core and proximal hip strength and functional mobility. ODI score shows moderate disability in  performance of home ADLs and community activities. Pt would benefit from skilled PT services working on improving core and proximal hip strength in order to decrease pain in gym and aquatic environments.   OBJECTIVE IMPAIRMENTS: Abnormal gait, decreased activity tolerance, decreased mobility, difficulty walking, decreased ROM, decreased strength, postural dysfunction, and pain   ACTIVITY LIMITATIONS: carrying, lifting, standing, squatting, stairs, transfers, and locomotion level  PARTICIPATION LIMITATIONS: meal prep, cleaning, driving, shopping, community activity, occupation, and yard work  PERSONAL FACTORS: Time since onset of injury/illness/exacerbation and 1-2 comorbidities: HTN are also affecting patient's functional outcome.   REHAB POTENTIAL: Good  CLINICAL DECISION MAKING: Evolving/moderate complexity  EVALUATION COMPLEXITY: Moderate   GOALS: Goals reviewed with patient? No  SHORT TERM GOALS: Target date: 09/29/2023   Pt will be compliant and knowledgeable with initial HEP for improved comfort and carryover Baseline: initial HEP given  Goal status: MET  2.  Pt will self report low back pain no greater than 5/10 for improved comfort and functional ability Baseline: 7/10 at worst 10/03/2023: 2/10 at worst Goal status: MET   LONG TERM GOALS: Target date: 11/03/2023 extended to 01/24/2024    Pt will be decrease ODI disability score to no greater than 25% (12/50) as proxy for functional improvement Baseline: 36% disability - 18/50  10/03/2023: 28% disability - 14/50 8/14: 15/50 Goal status: IN PROGRESS  2.  Pt will self report low back pain no greater than 3/10 for improved comfort and functional ability Baseline: 7/10 at worst 10/21/23: 5/10 at highest  9/9: 4/10 Goal status: Progressing  3.  Pt will increase 30 Second Sit to Stand rep count to no less than 14 reps for improved balance, strength, and functional mobility Baseline: 12 reps with UE 10/03/2023: 13 reps no  UE 9/9: 11 no UE support  Goal status: Ongoing   4.  Pt will improve standing and walking activity tolerance to no less than 30-45 minutes for improved comfort and functional ability with community activities  Baseline: 15 minutes 9/9: 15 min Goal status: Ongoing   PLAN:  PT FREQUENCY: 2x/week  PT DURATION: 8 weeks  PLANNED INTERVENTIONS: 97164- PT Re-evaluation, 97110-Therapeutic exercises, 97530- Therapeutic activity, 97112- Neuromuscular re-education, 97535- Self Care, [id number]- Manual therapy, [id number]- Gait training, [phone]- Aquatic Therapy, [phone]- Electrical stimulation (unattended), [phone]- Electrical stimulation (manual), 20560 (1-2 muscles), 20561 (3+ muscles)- Dry Needling, Cryotherapy, and Moist heat  PLAN FOR NEXT SESSION: assess HEP response, neutral spine core strengthening, hip flexor stretching    Alm JAYSON Kingdom PT 12/12/23 10:57 AM  Date of referral: 08/24/2023 Referring provider: Burnetta Aures, MD Referring diagnosis? M54.50 (ICD-10-CM) - Low back pain, unspecified  Treatment diagnosis? (if different than referring diagnosis) Other low back pain Muscle weakness (generalized) Other abnormalities of gait and mobility   What was this (referring dx) caused by? Arthritis   Nature of Condition: Chronic (continuous duration > 3 months)             Laterality: Both   Current Functional Measure Score:  ODI: 15/50 disability  Objective measurements identify impairments when they are compared to normal values, the uninvolved extremity, and prior level of function.  [x]  Yes  []  No  Objective assessment of functional ability: Moderate functional limitations   Briefly describe symptoms: fatigue and pain which limits standing and walking activities to 15 min  How did symptoms start:  chonic  Average pain intensity:  Last 24 hours: 4  Past week: 4  How often does the pt experience symptoms? Frequently  How much have the symptoms interfered with usual daily activities?  Moderately  How has condition changed since care began at this facility? A little better  In general, how is the patients overall health? Good   BACK PAIN (STarT Back Screening Tool) Has pain spread down the leg(s) at some time in the last 2 weeks? n Has there been pain in the shoulder or neck at some time in the last 2 weeks? n Has the pt only walked short distances because of back pain? y Has patient dressed more slowly because of back pain in the past 2 weeks? y Does patient think it's not safe for a person with this condition to be physically active? n Does patient have worrying thoughts a lot of the time? n Does patient feel back pain is terrible and will never get any better? n Has patient stopped enjoying things they usually enjoy? y

## 2023-12-21 ENCOUNTER — Ambulatory Visit: Attending: Orthopedic Surgery | Admitting: Physical Therapy

## 2023-12-21 ENCOUNTER — Encounter: Payer: Self-pay | Admitting: Physical Therapy

## 2023-12-21 DIAGNOSIS — M6281 Muscle weakness (generalized): Secondary | ICD-10-CM | POA: Insufficient documentation

## 2023-12-21 DIAGNOSIS — R2689 Other abnormalities of gait and mobility: Secondary | ICD-10-CM | POA: Diagnosis present

## 2023-12-21 DIAGNOSIS — M5459 Other low back pain: Secondary | ICD-10-CM | POA: Insufficient documentation

## 2023-12-21 NOTE — Therapy (Signed)
 OUTPATIENT PHYSICAL THERAPY TREATMENT   Patient Name: Ashley York MRN: [id number] DOB:[birth year], 68 y.o., female Today's Date: 12/21/2023  END OF SESSION:  PT End of Session - 12/21/23 1338     Visit Number 15    Number of Visits 17    Date for Recertification  01/24/24    Authorization Type [employer] MCR    Authorization Time Period Submitted for 12 additional viists    PT Start Time 1338    PT Stop Time 1416    PT Time Calculation (min) 38 min    Activity Tolerance Patient tolerated treatment well    Behavior During Therapy [facility] for tasks assessed/performed            Past Medical History:  Diagnosis Date   Allergy    Back pain    CHOLELITHIASIS 05/10/2008   Qualifier: Diagnosis of  By: Tivis RN, Channing     CONSTIPATION 04/23/2008   Qualifier: Diagnosis of  By: Abran MD, Norleen SAILOR    Dyspnea on exertion    Gallstones    GERD (gastroesophageal reflux disease)    Heart murmur    HEMORRHOIDS, INTERNAL 04/19/2008   Qualifier: Diagnosis of  By: Claudene GUSS Railing     Hyperlipidemia    Hypertension    HYPERTENSION 04/19/2008   Qualifier: Diagnosis of  By: Claudene GUSS Railing     Hypothyroidism    HYPOTHYROIDISM 04/19/2008   Qualifier: Diagnosis of  By: Claudene GUSS Railing     Joint pain    Osteoarthritis    Overweight    Pre-diabetes    Sleep apnea    cpap   SOB (shortness of breath)    Spondylosis    Vitamin B 12 deficiency    Vitamin D  deficiency    Past Surgical History:  Procedure Laterality Date   CESAREAN SECTION  1983, 1984   COLONOSCOPY     KNEE SURGERY Right 03/22/2008   TOTAL KNEE ARTHROPLASTY Left 12/01/2020   Procedure: TOTAL KNEE ARTHROPLASTY;  Surgeon: Melodi Lerner, MD;  Location: [facility];  Service: Orthopedics;  Laterality: Left;    WISDOM TOOTH EXTRACTION  03/22/1997   Patient Active Problem List   Diagnosis Date Noted   Other fatigue 12/07/2023   Obesity, unspecified 11/03/2023   Prediabetes 09/19/2023   OSA (obstructive sleep  apnea) - mild on CPAP 09/19/2023   Pure hypercholesterolemia 09/19/2023   Anemia 09/19/2023   Daytime somnolence 09/19/2023   Vitamin D  deficiency 09/19/2023   OA (osteoarthritis) of knee 12/01/2020   Dyspnea on exertion 11/17/2017   CHOLELITHIASIS 05/10/2008   CONSTIPATION 04/23/2008   Hypothyroidism 04/19/2008   Essential hypertension 04/19/2008   HEMORRHOIDS, INTERNAL 04/19/2008    PCP: Rexanne Ingle, MD  REFERRING PROVIDER: Burnetta Aures, MD  REFERRING DIAG: M54.50 (ICD-10-CM) - Low back pain, unspecified   Rationale for Evaluation and Treatment: Rehabilitation  THERAPY DIAG:  Other low back pain  Muscle weakness (generalized)  ONSET DATE: Chronic  SUBJECTIVE:  SUBJECTIVE STATEMENT: Pt reports her back is doing fairly well  EVAL: Pt presents to PT with reports of chronic lower back pain with referral into LE R>L. Notes pain increases with prolonged standing and walking. Some N/T in bilateral LE also R>L. Does aquatic exercise and notes this seem sto help her discomfort some too. Denies bowel/bladder changes or saddle anesthesia.   PERTINENT HISTORY:  HTN: also bilat knee replacements   PAIN:  Are you having pain?  Yes: NPRS scale: 2/10 Worst: 4/10 Pain location: lower back,R LE Pain description: Tired Aggravating factors: standing, walking Relieving factors: rest, medication  PRECAUTIONS: Spondylolisthesis   RED FLAGS: None   WEIGHT BEARING RESTRICTIONS: No  FALLS:  Has patient fallen in last 6 months? No  LIVING ENVIRONMENT: Lives with: lives alone Lives in: House/apartment Stairs: No Has following equipment at home: Single point cane  OCCUPATION: Retired  PLOF: Independent  PATIENT GOALS: Pt wants to decrease back pain and improve comfort with standing and  walking  OBJECTIVE:  Note: Objective measures were completed at Evaluation unless otherwise noted.  DIAGNOSTIC FINDINGS: See imaging  PATIENT SURVEYS:  ODI: 18/50 - 36% disability  COGNITION: Overall cognitive status: Within functional limits for tasks assessed     SENSATION: [facility]  MUSCLE LENGTH: Thomas test: Right (+); Left (+)  POSTURE: rounded shoulders, forward head, and increased lumbar lordosis  PALPATION: TTP to lumbar paraspinals and R gluteals   LUMBAR ROM:   AROM eval  Flexion 50%  Extension 25%  Right lateral flexion   Left lateral flexion   Right rotation   Left rotation    (Blank rows = not tested)  LOWER EXTREMITY MMT:    MMT Right eval Left eval  Hip flexion 3+ 3+  Hip extension    Hip abduction 3+ 3+  Hip adduction    Hip internal rotation    Hip external rotation    Knee flexion 3+ 3+  Knee extension 3+ 3+  Ankle dorsiflexion    Ankle plantarflexion    Ankle inversion    Ankle eversion     (Blank rows = not tested)  LUMBAR SPECIAL TESTS:  Straight leg raise test: Negative and Slump test: Negative  FUNCTIONAL TESTS:  30 Second Sit to Stand: 12 reps with UE  GAIT: Distance walked: 32ft Assistive device utilized: None Level of assistance: Complete Independence Comments: flexed trunk  TREATMENT:  [facility] Adult PT Treatment:                                                DATE: 12/21/23 Aquatic therapy at [facility]- [address] - therapeutic pool temp 92 degrees Pt enters building independently.  Treatment took place in water  3.8 to  4 ft 8 in.feet deep depending upon activity.  Pt entered and exited the pool via stair and handrails    Aquatic Therapy: Water  walking for warm up fwd/lat/bkwds Hip abd circle CW/CC Step up fwd and lat Deep water  jogging x3' Noodle stomp AMRAP - 1' ea Alternating lunges fwd and lat Hip flexor stretch at steps NBOS walking with dumbells SLS w/ DB   Pt requires the buoyancy of water  for  active assisted exercises with buoyancy supported for strengthening and AROM exercises. Hydrostatic pressure also supports joints by unweighting joint load by at least 50 % in 3-4 feet depth water . 80% in chest to neck  deep water . Water  will provide assistance with movement using the current and laminar flow while the buoyancy reduces weight bearing. Pt requires the viscosity of the water  for resistance with strengthening exercises.  [facility] Adult PT Treatment  12/12/2023: Therapeutic Exercise Supine PPT x 10 - 5 hold Supine PPT with ball 2x10 - 5'' Pilates SLR 2x10 ea Bridge on ball  2x10 - small arc  DKTC with swiss ball x 10 Swiss ball repeated lumbar flexion x 15 Cybex leg press 3x10 60# Therapeutic Activity STS from low table - 10# - 2x10 Standing hip abd/ext 2x10 25# Step ups 8 in x 10 ea B UE support Functional squat 2x10 B UE support  [facility] Adult PT Treatment  12/05/2023: Therapeutic Exercise LTR - some cramping reported Supine PPT with ball 1x10 - 5'' Pilates SLR 2x7 Supine clam - black - 2x10 Bridge on ball  2x10 - small arc  Supine hamstring stretch 30 sec ea 2x Therapeutic Activity STS from raised table - 10# - 2x10 Step ups 6 in x10 ea Lateral Step ups 6 in x10 ea Neuromuscular re-ed: Semi tandem stance Narrow base walking at counter Lateral walking on airex beam  [facility] Adult PT Treatment:                                                DATE: 11/30/23 Aquatic therapy at [facility]- [address] - therapeutic pool temp 92 degrees Pt enters building independently.  Treatment took place in water  3.8 to  4 ft 8 in.feet deep depending upon activity.  Pt entered and exited the pool via stair and handrails    Aquatic Therapy: Water  walking for warm up fwd/lat/bkwds High march with opposite weight tap Suitcase carry Blue DB Walking L stretch 5 hold x10 SL RDL with white barbell to pool wall x10 BIL W/ hip openers Deep water  jogging x3' Noodle stomp AMRAP - 1'  ea Jump lunges Hip flexor stretch at steps   Pt requires the buoyancy of water  for active assisted exercises with buoyancy supported for strengthening and AROM exercises. Hydrostatic pressure also supports joints by unweighting joint load by at least 50 % in 3-4 feet depth water . 80% in chest to neck deep water . Water  will provide assistance with movement using the current and laminar flow while the buoyancy reduces weight bearing. Pt requires the viscosity of the water  for resistance with strengthening exercises.  [facility] Adult PT Treatment  11/29/2023:  Therapeutic Exercise:  Therapeutic Exercise  LTR - some cramping reported Supine PPT with ball 1x10  Pilates SLR 1x10 ea Supine clam - black - 2x10 Bridge on ball  2x10 - small arc  Supine hamstring stretch 30 sec ea  Therapeutic Activity Step ups 6 in x10 ea Lateral Step ups 6 in x10 ea Standing paloff press - 2x GTB - 2x10 ea Goal assessment Neuromuscular re-ed: Semi tandem stance   PATIENT EDUCATION:  Education details: eval findings, ODI, HEP, POC Person educated: Patient Education method: Explanation, Demonstration, and Handouts Education comprehension: verbalized understanding and returned demonstration  HOME EXERCISE PROGRAM: Access Code: [email or web address] URL: [email or web address] Date: 11/29/2023 Prepared by: Helene Gasmen  Exercises - Modified Thomas Stretch  - 1 x daily - 7 x weekly - 2 reps - 30 sec hold - Supine Posterior Pelvic Tilt  - 1 x daily - 7 x weekly - 2 sets - 10  reps - 5 sec hold - Active Straight Leg Raise with Quad Set  - 1 x daily - 7 x weekly - 2 sets - 10 reps - Supine Lower Trunk Rotation  - 1 x daily - 7 x weekly - 10 reps - Hooklying Clamshell with Resistance  - 1 x daily - 7 x weekly - 2-3 sets - 10 reps - green band hold - Standing Hip Abduction with Counter Support  - 1 x daily - 7 x weekly - 2 sets - 10 reps - Standing Hip Extension with Counter Support  - 1 x daily - 7 x weekly  - 2 sets - 10 reps - Sit to Stand Without Arm Support  - 1 x daily - 4-7 x weekly - 2 sets - 10 reps - Semi-Tandem Balance at [employer] Eyes Open  - 1 x daily - 7 x weekly - 1 sets - 3 reps - 30 seconds hold  ASSESSMENT:  CLINICAL IMPRESSION: Session today focused on hip and core strengthening as well as balance in the aquatic environment for use of buoyancy to offload joints and the viscosity of water  as resistance during therapeutic exercise.  Pt able to complete all exercises with good form win min to mod cuing for pacing and form.  Significantly challenged by SLS with hip fatigue noted.  Patient was able to tolerate all prescribed exercises in the aquatic environment with no adverse effects and reports no increase pain at the end of the session. Patient continues to benefit from skilled PT services on land and aquatic based and should be progressed as able to improve functional independence.   EVAL: Patient is a 68 y.o. F who was seen today for physical therapy evaluation and treatment for chronic LBP with referral into bilateral LE R>L. Physical findings are consistent with MD impression as pt demonstrates decrease in core and proximal hip strength and functional mobility. ODI score shows moderate disability in performance of home ADLs and community activities. Pt would benefit from skilled PT services working on improving core and proximal hip strength in order to decrease pain in gym and aquatic environments.   OBJECTIVE IMPAIRMENTS: Abnormal gait, decreased activity tolerance, decreased mobility, difficulty walking, decreased ROM, decreased strength, postural dysfunction, and pain   ACTIVITY LIMITATIONS: carrying, lifting, standing, squatting, stairs, transfers, and locomotion level  PARTICIPATION LIMITATIONS: meal prep, cleaning, driving, shopping, community activity, occupation, and yard work  PERSONAL FACTORS: Time since onset of injury/illness/exacerbation and 1-2 comorbidities: HTN  are also affecting patient's functional outcome.   REHAB POTENTIAL: Good  CLINICAL DECISION MAKING: Evolving/moderate complexity  EVALUATION COMPLEXITY: Moderate   GOALS: Goals reviewed with patient? No  SHORT TERM GOALS: Target date: 09/29/2023   Pt will be compliant and knowledgeable with initial HEP for improved comfort and carryover Baseline: initial HEP given  Goal status: MET  2.  Pt will self report low back pain no greater than 5/10 for improved comfort and functional ability Baseline: 7/10 at worst 10/03/2023: 2/10 at worst Goal status: MET   LONG TERM GOALS: Target date: 11/03/2023 extended to 01/24/2024    Pt will be decrease ODI disability score to no greater than 25% (12/50) as proxy for functional improvement Baseline: 36% disability - 18/50  10/03/2023: 28% disability - 14/50 8/14: 15/50 Goal status: IN PROGRESS  2.  Pt will self report low back pain no greater than 3/10 for improved comfort and functional ability Baseline: 7/10 at worst 10/21/23: 5/10 at highest  9/9: 4/10 Goal status:  Progressing  3.  Pt will increase 30 Second Sit to Stand rep count to no less than 14 reps for improved balance, strength, and functional mobility Baseline: 12 reps with UE 10/03/2023: 13 reps no UE 9/9: 11 no UE support  Goal status: Ongoing   4.  Pt will improve standing and walking activity tolerance to no less than 30-45 minutes for improved comfort and functional ability with community activities  Baseline: 15 minutes 9/9: 15 min Goal status: Ongoing   PLAN:  PT FREQUENCY: 2x/week  PT DURATION: 8 weeks  PLANNED INTERVENTIONS: 97164- PT Re-evaluation, 97110-Therapeutic exercises, 97530- Therapeutic activity, 97112- Neuromuscular re-education, 97535- Self Care, [id number]- Manual therapy, [id number]- Gait training, [id number]- Aquatic Therapy, [phone]- Electrical stimulation (unattended), [id number]- Electrical stimulation (manual), 20560 (1-2 muscles), 20561 (3+ muscles)- Dry Needling,  Cryotherapy, and Moist heat  PLAN FOR NEXT SESSION: assess HEP response, neutral spine core strengthening, hip flexor stretching    Helene FORBES Gasmen PT 12/21/23 2:16 PM  Date of referral: 08/24/2023 Referring provider: Burnetta Aures, MD Referring diagnosis? M54.50 (ICD-10-CM) - Low back pain, unspecified  Treatment diagnosis? (if different than referring diagnosis) Other low back pain Muscle weakness (generalized) Other abnormalities of gait and mobility   What was this (referring dx) caused by? Arthritis   Nature of Condition: Chronic (continuous duration > 3 months)             Laterality: Both   Current Functional Measure Score:  ODI: 15/50 disability  Objective measurements identify impairments when they are compared to normal values, the uninvolved extremity, and prior level of function.  [x]  Yes  []  No  Objective assessment of functional ability: Moderate functional limitations   Briefly describe symptoms: fatigue and pain which limits standing and walking activities to 15 min  How did symptoms start: chonic  Average pain intensity:  Last 24 hours: 4  Past week: 4  How often does the pt experience symptoms? Frequently  How much have the symptoms interfered with usual daily activities? Moderately  How has condition changed since care began at this facility? A little better  In general, how is the patients overall health? Good   BACK PAIN (STarT Back Screening Tool) Has pain spread down the leg(s) at some time in the last 2 weeks? n Has there been pain in the shoulder or neck at some time in the last 2 weeks? n Has the pt only walked short distances because of back pain? y Has patient dressed more slowly because of back pain in the past 2 weeks? y Does patient think it's not safe for a person with this condition to be physically active? n Does patient have worrying thoughts a lot of the time? n Does patient feel back pain is terrible and will never get any  better? n Has patient stopped enjoying things they usually enjoy? y

## 2023-12-26 ENCOUNTER — Ambulatory Visit (INDEPENDENT_AMBULATORY_CARE_PROVIDER_SITE_OTHER): Admitting: Internal Medicine

## 2023-12-28 ENCOUNTER — Encounter: Payer: Self-pay | Admitting: Physical Therapy

## 2023-12-28 ENCOUNTER — Ambulatory Visit: Admitting: Physical Therapy

## 2023-12-28 DIAGNOSIS — R2689 Other abnormalities of gait and mobility: Secondary | ICD-10-CM

## 2023-12-28 DIAGNOSIS — M5459 Other low back pain: Secondary | ICD-10-CM | POA: Diagnosis not present

## 2023-12-28 DIAGNOSIS — M6281 Muscle weakness (generalized): Secondary | ICD-10-CM

## 2023-12-28 NOTE — Therapy (Addendum)
 u  PHYSICAL THERAPY UNPLANNED DISCHARGE SUMMARY   Visits from Start of Care: 16  Current functional level related to goals / functional outcomes: Current status unknown   Remaining deficits: Current status unknown   Education / Equipment: Pt has not returned since visit listed below  Patient goals were not assessed. Patient is being discharged due to not returning since the last visit.  (the note below was addended to include the above D/C summary on 03/19/2024)  OUTPATIENT PHYSICAL THERAPY TREATMENT   Patient Name: Ashley York MRN: [id number] DOB:03/30/1955, 68 y.o., female Today's Date: 12/29/2023  END OF SESSION:  PT End of Session - 12/28/23 1336     Visit Number 16    Number of Visits 17    Date for Recertification  01/24/24    Authorization Type [employer] MCR    Authorization Time Period Submitted for 12 additional viists    PT Start Time 1336    PT Stop Time 1414    PT Time Calculation (min) 38 min    Activity Tolerance Patient tolerated treatment well    Behavior During Therapy [facility] for tasks assessed/performed             Past Medical History:  Diagnosis Date   Allergy    Back pain    CHOLELITHIASIS 05/10/2008   Qualifier: Diagnosis of  By: Tivis RN, Channing     CONSTIPATION 04/23/2008   Qualifier: Diagnosis of  By: Abran MD, Norleen SAILOR    Dyspnea on exertion    Gallstones    GERD (gastroesophageal reflux disease)    Heart murmur    HEMORRHOIDS, INTERNAL 04/19/2008   Qualifier: Diagnosis of  By: Claudene GUSS Railing     Hyperlipidemia    Hypertension    HYPERTENSION 04/19/2008   Qualifier: Diagnosis of  By: Claudene GUSS Railing     Hypothyroidism    HYPOTHYROIDISM 04/19/2008   Qualifier: Diagnosis of  By: Claudene GUSS Railing     Joint pain    Osteoarthritis    Overweight    Pre-diabetes    Sleep apnea    cpap   SOB (shortness of breath)    Spondylosis    Vitamin B 12 deficiency    Vitamin D  deficiency    Past Surgical History:  Procedure  Laterality Date   CESAREAN SECTION  1983, 1984   COLONOSCOPY     KNEE SURGERY Right 03/22/2008   TOTAL KNEE ARTHROPLASTY Left 12/01/2020   Procedure: TOTAL KNEE ARTHROPLASTY;  Surgeon: Melodi Lerner, MD;  Location: [facility];  Service: Orthopedics;  Laterality: Left;    WISDOM TOOTH EXTRACTION  03/22/1997   Patient Active Problem List   Diagnosis Date Noted   Other fatigue 12/07/2023   Obesity, unspecified 11/03/2023   Prediabetes 09/19/2023   OSA (obstructive sleep apnea) - mild on CPAP 09/19/2023   Pure hypercholesterolemia 09/19/2023   Anemia 09/19/2023   Daytime somnolence 09/19/2023   Vitamin D  deficiency 09/19/2023   OA (osteoarthritis) of knee 12/01/2020   Dyspnea on exertion 11/17/2017   CHOLELITHIASIS 05/10/2008   CONSTIPATION 04/23/2008   Hypothyroidism 04/19/2008   Essential hypertension 04/19/2008   HEMORRHOIDS, INTERNAL 04/19/2008    PCP: Rexanne Ingle, MD  REFERRING PROVIDER: Burnetta Aures, MD  REFERRING DIAG: M54.50 (ICD-10-CM) - Low back pain, unspecified   Rationale for Evaluation and Treatment: Rehabilitation  THERAPY DIAG:  Other low back pain  Muscle weakness (generalized)  Other abnormalities of gait and mobility  ONSET DATE: Chronic  SUBJECTIVE:  SUBJECTIVE STATEMENT: Pt reports her back is doing fairly well.  Plans on starting with chiro soon.  EVAL: Pt presents to PT with reports of chronic lower back pain with referral into LE R>L. Notes pain increases with prolonged standing and walking. Some N/T in bilateral LE also R>L. Does aquatic exercise and notes this seem sto help her discomfort some too. Denies bowel/bladder changes or saddle anesthesia.   PERTINENT HISTORY:  HTN: also bilat knee replacements   PAIN:  Are you having pain?  Yes: NPRS scale:  2/10 Worst: 4/10 Pain location: lower back,R LE Pain description: Tired Aggravating factors: standing, walking Relieving factors: rest, medication  PRECAUTIONS: Spondylolisthesis   RED FLAGS: None   WEIGHT BEARING RESTRICTIONS: No  FALLS:  Has patient fallen in last 6 months? No  LIVING ENVIRONMENT: Lives with: lives alone Lives in: House/apartment Stairs: No Has following equipment at home: Single point cane  OCCUPATION: Retired  PLOF: Independent  PATIENT GOALS: Pt wants to decrease back pain and improve comfort with standing and walking  OBJECTIVE:  Note: Objective measures were completed at Evaluation unless otherwise noted.  DIAGNOSTIC FINDINGS: See imaging  PATIENT SURVEYS:  ODI: 18/50 - 36% disability  COGNITION: Overall cognitive status: Within functional limits for tasks assessed     SENSATION: [facility]  MUSCLE LENGTH: Thomas test: Right (+); Left (+)  POSTURE: rounded shoulders, forward head, and increased lumbar lordosis  PALPATION: TTP to lumbar paraspinals and R gluteals   LUMBAR ROM:   AROM eval  Flexion 50%  Extension 25%  Right lateral flexion   Left lateral flexion   Right rotation   Left rotation    (Blank rows = not tested)  LOWER EXTREMITY MMT:    MMT Right eval Left eval  Hip flexion 3+ 3+  Hip extension    Hip abduction 3+ 3+  Hip adduction    Hip internal rotation    Hip external rotation    Knee flexion 3+ 3+  Knee extension 3+ 3+  Ankle dorsiflexion    Ankle plantarflexion    Ankle inversion    Ankle eversion     (Blank rows = not tested)  LUMBAR SPECIAL TESTS:  Straight leg raise test: Negative and Slump test: Negative  FUNCTIONAL TESTS:  30 Second Sit to Stand: 12 reps with UE  GAIT: Distance walked: 12ft Assistive device utilized: None Level of assistance: Complete Independence Comments: flexed trunk  TREATMENT:  [facility] Adult PT Treatment:                                                DATE:  12/28/23 Aquatic therapy at [facility] [address] - therapeutic pool temp 92 degrees Pt enters building independently.  Treatment took place in water  3.8 to  4 ft 8 in.feet deep depending upon activity.  Pt entered and exited the pool via stair and handrails    Aquatic Therapy: Water  walking for warm up fwd/lat/bkwds Power squat Hip abd circle CW/CC Step up fwd and lat w/ march Deep water  jogging x3' Noodle stomp AMRAP - 1' ea Alternating lunges fwd and lat Hip flexor stretch at steps NBOS walking with dumbells Fwd and bkwd Power sit to stand SLS w/ DB   Pt requires the buoyancy of water  for active assisted exercises with buoyancy supported for strengthening and AROM exercises. Hydrostatic pressure also supports joints by  unweighting joint load by at least 50 % in 3-4 feet depth water . 80% in chest to neck deep water . Water  will provide assistance with movement using the current and laminar flow while the buoyancy reduces weight bearing. Pt requires the viscosity of the water  for resistance with strengthening exercises.  [facility] Adult PT Treatment:                                                DATE: 12/21/23 Aquatic therapy at [facility] [address] - therapeutic pool temp 92 degrees Pt enters building independently.  Treatment took place in water  3.8 to  4 ft 8 in.feet deep depending upon activity.  Pt entered and exited the pool via stair and handrails    Aquatic Therapy: Water  walking for warm up fwd/lat/bkwds Hip abd circle CW/CC Step up fwd and lat Deep water  jogging x3' Noodle stomp AMRAP - 1' ea Alternating lunges fwd and lat Hip flexor stretch at steps NBOS walking with dumbells SLS w/ DB   Pt requires the buoyancy of water  for active assisted exercises with buoyancy supported for strengthening and AROM exercises. Hydrostatic pressure also supports joints by unweighting joint load by at least 50 % in 3-4 feet depth water . 80% in chest to neck deep water .  Water  will provide assistance with movement using the current and laminar flow while the buoyancy reduces weight bearing. Pt requires the viscosity of the water  for resistance with strengthening exercises.  [facility] Adult PT Treatment  12/12/2023: Therapeutic Exercise Supine PPT x 10 - 5 hold Supine PPT with ball 2x10 - 5'' Pilates SLR 2x10 ea Bridge on ball  2x10 - small arc  DKTC with swiss ball x 10 Swiss ball repeated lumbar flexion x 15 Cybex leg press 3x10 60# Therapeutic Activity STS from low table - 10# - 2x10 Standing hip abd/ext 2x10 25# Step ups 8 in x 10 ea B UE support Functional squat 2x10 B UE support  [facility] Adult PT Treatment  12/05/2023: Therapeutic Exercise LTR - some cramping reported Supine PPT with ball 1x10 - 5'' Pilates SLR 2x7 Supine clam - black - 2x10 Bridge on ball  2x10 - small arc  Supine hamstring stretch 30 sec ea 2x Therapeutic Activity STS from raised table - 10# - 2x10 Step ups 6 in x10 ea Lateral Step ups 6 in x10 ea Neuromuscular re-ed: Semi tandem stance Narrow base walking at counter Lateral walking on airex beam  [facility] Adult PT Treatment:                                                DATE: 11/30/23 Aquatic therapy at [facility] [address] - therapeutic pool temp 92 degrees Pt enters building independently.  Treatment took place in water  3.8 to  4 ft 8 in.feet deep depending upon activity.  Pt entered and exited the pool via stair and handrails    Aquatic Therapy: Water  walking for warm up fwd/lat/bkwds High march with opposite weight tap Suitcase carry Blue DB Walking L stretch 5 hold x10 SL RDL with white barbell to pool wall x10 BIL W/ hip openers Deep water  jogging x3' Noodle stomp AMRAP - 1' ea Jump lunges Hip flexor stretch at steps  Pt requires the buoyancy of water  for active assisted exercises with buoyancy supported for strengthening and AROM exercises. Hydrostatic pressure also supports joints by unweighting joint  load by at least 50 % in 3-4 feet depth water . 80% in chest to neck deep water . Water  will provide assistance with movement using the current and laminar flow while the buoyancy reduces weight bearing. Pt requires the viscosity of the water  for resistance with strengthening exercises.  [facility] Adult PT Treatment  11/29/2023:  Therapeutic Exercise:  Therapeutic Exercise  LTR - some cramping reported Supine PPT with ball 1x10  Pilates SLR 1x10 ea Supine clam - black - 2x10 Bridge on ball  2x10 - small arc  Supine hamstring stretch 30 sec ea  Therapeutic Activity Step ups 6 in x10 ea Lateral Step ups 6 in x10 ea Standing paloff press - 2x GTB - 2x10 ea Goal assessment Neuromuscular re-ed: Semi tandem stance   PATIENT EDUCATION:  Education details: eval findings, ODI, HEP, POC Person educated: Patient Education method: Explanation, Demonstration, and Handouts Education comprehension: verbalized understanding and returned demonstration  HOME EXERCISE PROGRAM: Access Code: [email or web address] URL: [email or web address] Date: 11/29/2023 Prepared by: Helene Gasmen  Exercises - Modified Thomas Stretch  - 1 x daily - 7 x weekly - 2 reps - 30 sec hold - Supine Posterior Pelvic Tilt  - 1 x daily - 7 x weekly - 2 sets - 10 reps - 5 sec hold - Active Straight Leg Raise with Quad Set  - 1 x daily - 7 x weekly - 2 sets - 10 reps - Supine Lower Trunk Rotation  - 1 x daily - 7 x weekly - 10 reps - Hooklying Clamshell with Resistance  - 1 x daily - 7 x weekly - 2-3 sets - 10 reps - green band hold - Standing Hip Abduction with Counter Support  - 1 x daily - 7 x weekly - 2 sets - 10 reps - Standing Hip Extension with Counter Support  - 1 x daily - 7 x weekly - 2 sets - 10 reps - Sit to Stand Without Arm Support  - 1 x daily - 4-7 x weekly - 2 sets - 10 reps - Semi-Tandem Balance at [employer] Eyes Open  - 1 x daily - 7 x weekly - 1 sets - 3 reps - 30 seconds hold  ASSESSMENT:  CLINICAL  IMPRESSION: Session today focused on hip and core strengthening as well as balance in the aquatic environment for use of buoyancy to offload joints and the viscosity of water  as resistance during therapeutic exercise.  Progressed dynamic balance with retro NBOS walking.  Pt wit significant improvement in SLS stability today with minimal LOB.  Patient was able to tolerate all prescribed exercises in the aquatic environment with no adverse effects and reports no increase pain at the end of the session. Patient continues to benefit from skilled PT services on land and aquatic based and should be progressed as able to improve functional independence.   EVAL: Patient is a 68 y.o. F who was seen today for physical therapy evaluation and treatment for chronic LBP with referral into bilateral LE R>L. Physical findings are consistent with MD impression as pt demonstrates decrease in core and proximal hip strength and functional mobility. ODI score shows moderate disability in performance of home ADLs and community activities. Pt would benefit from skilled PT services working on improving core and proximal hip strength in order to decrease pain in gym and aquatic environments.  OBJECTIVE IMPAIRMENTS: Abnormal gait, decreased activity tolerance, decreased mobility, difficulty walking, decreased ROM, decreased strength, postural dysfunction, and pain   ACTIVITY LIMITATIONS: carrying, lifting, standing, squatting, stairs, transfers, and locomotion level  PARTICIPATION LIMITATIONS: meal prep, cleaning, driving, shopping, community activity, occupation, and yard work  PERSONAL FACTORS: Time since onset of injury/illness/exacerbation and 1-2 comorbidities: HTN are also affecting patient's functional outcome.   REHAB POTENTIAL: Good  CLINICAL DECISION MAKING: Evolving/moderate complexity  EVALUATION COMPLEXITY: Moderate   GOALS: Goals reviewed with patient? No  SHORT TERM GOALS: Target date: 09/29/2023   Pt  will be compliant and knowledgeable with initial HEP for improved comfort and carryover Baseline: initial HEP given  Goal status: MET  2.  Pt will self report low back pain no greater than 5/10 for improved comfort and functional ability Baseline: 7/10 at worst 10/03/2023: 2/10 at worst Goal status: MET   LONG TERM GOALS: Target date: 11/03/2023 extended to 01/24/2024    Pt will be decrease ODI disability score to no greater than 25% (12/50) as proxy for functional improvement Baseline: 36% disability - 18/50  10/03/2023: 28% disability - 14/50 8/14: 15/50 Goal status: IN PROGRESS  2.  Pt will self report low back pain no greater than 3/10 for improved comfort and functional ability Baseline: 7/10 at worst 10/21/23: 5/10 at highest  9/9: 4/10 Goal status: Progressing  3.  Pt will increase 30 Second Sit to Stand rep count to no less than 14 reps for improved balance, strength, and functional mobility Baseline: 12 reps with UE 10/03/2023: 13 reps no UE 9/9: 11 no UE support  Goal status: Ongoing   4.  Pt will improve standing and walking activity tolerance to no less than 30-45 minutes for improved comfort and functional ability with community activities  Baseline: 15 minutes 9/9: 15 min Goal status: Ongoing   PLAN:  PT FREQUENCY: 2x/week  PT DURATION: 8 weeks  PLANNED INTERVENTIONS: 97164- PT Re-evaluation, 97110-Therapeutic exercises, 97530- Therapeutic activity, 97112- Neuromuscular re-education, 97535- Self Care, [id number]- Manual therapy, [id number]- Gait training, [phone]- Aquatic Therapy, [phone]- Electrical stimulation (unattended), [id number]- Electrical stimulation (manual), 20560 (1-2 muscles), 20561 (3+ muscles)- Dry Needling, Cryotherapy, and Moist heat  PLAN FOR NEXT SESSION: assess HEP response, neutral spine core strengthening, hip flexor stretching    Helene FORBES Gasmen PT 12/29/23 8:05 AM  Date of referral: 08/24/2023 Referring provider: Burnetta Aures, MD Referring diagnosis?  M54.50 (ICD-10-CM) - Low back pain, unspecified  Treatment diagnosis? (if different than referring diagnosis) Other low back pain Muscle weakness (generalized) Other abnormalities of gait and mobility   What was this (referring dx) caused by? Arthritis   Nature of Condition: Chronic (continuous duration > 3 months)             Laterality: Both   Current Functional Measure Score:  ODI: 15/50 disability  Objective measurements identify impairments when they are compared to normal values, the uninvolved extremity, and prior level of function.  [x]  Yes  []  No  Objective assessment of functional ability: Moderate functional limitations   Briefly describe symptoms: fatigue and pain which limits standing and walking activities to 15 min  How did symptoms start: chonic  Average pain intensity:  Last 24 hours: 4  Past week: 4  How often does the pt experience symptoms? Frequently  How much have the symptoms interfered with usual daily activities? Moderately  How has condition changed since care began at this facility? A little better  In general, how is the patients overall health? Good  BACK PAIN (STarT Back Screening Tool) Has pain spread down the leg(s) at some time in the last 2 weeks? n Has there been pain in the shoulder or neck at some time in the last 2 weeks? n Has the pt only walked short distances because of back pain? y Has patient dressed more slowly because of back pain in the past 2 weeks? y Does patient think it's not safe for a person with this condition to be physically active? n Does patient have worrying thoughts a lot of the time? n Does patient feel back pain is terrible and will never get any better? n Has patient stopped enjoying things they usually enjoy? y

## 2023-12-29 DIAGNOSIS — M9902 Segmental and somatic dysfunction of thoracic region: Secondary | ICD-10-CM | POA: Diagnosis not present

## 2023-12-29 DIAGNOSIS — M9903 Segmental and somatic dysfunction of lumbar region: Secondary | ICD-10-CM | POA: Diagnosis not present

## 2023-12-29 DIAGNOSIS — M62830 Muscle spasm of back: Secondary | ICD-10-CM | POA: Diagnosis not present

## 2023-12-29 DIAGNOSIS — M9901 Segmental and somatic dysfunction of cervical region: Secondary | ICD-10-CM | POA: Diagnosis not present

## 2024-01-03 DIAGNOSIS — M5416 Radiculopathy, lumbar region: Secondary | ICD-10-CM | POA: Diagnosis not present

## 2024-01-04 ENCOUNTER — Encounter (INDEPENDENT_AMBULATORY_CARE_PROVIDER_SITE_OTHER): Payer: Self-pay | Admitting: Internal Medicine

## 2024-01-04 ENCOUNTER — Ambulatory Visit (INDEPENDENT_AMBULATORY_CARE_PROVIDER_SITE_OTHER): Admitting: Internal Medicine

## 2024-01-04 VITALS — BP 132/65 | HR 68 | Temp 98.2°F | Ht 65.5 in | Wt 247.0 lb

## 2024-01-04 DIAGNOSIS — G4733 Obstructive sleep apnea (adult) (pediatric): Secondary | ICD-10-CM | POA: Diagnosis not present

## 2024-01-04 DIAGNOSIS — Z6841 Body Mass Index (BMI) 40.0 and over, adult: Secondary | ICD-10-CM

## 2024-01-04 DIAGNOSIS — E7800 Pure hypercholesterolemia, unspecified: Secondary | ICD-10-CM | POA: Diagnosis not present

## 2024-01-04 DIAGNOSIS — I10 Essential (primary) hypertension: Secondary | ICD-10-CM

## 2024-01-04 DIAGNOSIS — R7303 Prediabetes: Secondary | ICD-10-CM | POA: Diagnosis not present

## 2024-01-04 DIAGNOSIS — E66813 Obesity, class 3: Secondary | ICD-10-CM

## 2024-01-04 NOTE — Assessment & Plan Note (Signed)
 Most recent A1c is  Lab Results  Component Value Date   HGBA1C 6.2 (H) 09/19/2023   HGBA1C 6.0 (H) 11/19/2020    Patient aware of disease state and risk of progression. This may contribute to abnormal cravings, fatigue and diabetic complications without having diabetes.   We have discussed treatment options which include: losing 7 to 10% of body weight, increasing physical activity to a goal of 150 minutes a week at moderate intensity.  Advised to maintain a diet low on simple and processed carbohydrates.  Has been educated on the carb insulin  model for obesity  She had side effects to metformin in the past.  She is now on Ozempic 0.25 mg once a week

## 2024-01-04 NOTE — Progress Notes (Signed)
 Office: [phone]  /  Fax: [phone]  Weight Summary and Body Composition Analysis (BIA)  Vitals Temp: 98.2 F (36.8 C) BP: 132/65 Pulse Rate: 68 SpO2: 99 %   Anthropometric Measurements Height: 5' 5.5 (1.664 m) Weight: 247 lb (112 kg) BMI (Calculated): 40.46 Weight at Last Visit: 247 lb Weight Lost Since Last Visit: 0 lb Weight Gained Since Last Visit: 0 lb Starting Weight: 263 lb Total Weight Loss (lbs): 16 lb (7.258 kg) Peak Weight: 270 lb   Body Composition  Body Fat %: 52.4 % Fat Mass (lbs): 129.6 lbs Muscle Mass (lbs): 111.8 lbs Total Body Water  (lbs): 92.4 lbs Visceral Fat Rating : 18    RMR: 1627  Today's Visit #: 5  Starting Date: 09/19/23   Subjective   Chief Complaint: Obesity  Interval History Discussed the use of AI scribe software for clinical note transcription with the patient, who gave verbal consent to proceed.  History of Present Illness Ashley York is a 68 year old female with prediabetes, obstructive sleep apnea, and hypertension who presents for medical weight management.  She is on a 1200 calorie diet plan but adheres to it about 30% of the time. She is not exercising regularly but is working on incorporating more physical activity. She focuses on meal prepping, eating more fruits and vegetables, and avoiding sugary drinks. She has lost 16 pounds previously and is motivated to continue her weight loss journey to maintain her independence and health.  She is currently on Ozempic for weight management but has reduced the dose, leading to a slight return of her appetite. Despite this, she still feels full quickly when eating.  She has a history of hypertension and is taking losartan  100 mg with hydrochlorothiazide  25 mg and amlodipine  10 mg daily. No recent intake of energy drinks or coffee. She acknowledges that stress can elevate her blood pressure.  She uses her CPAP machine every night for obstructive sleep apnea. Her  sleep has been disrupted recently due to events like homecoming, but she slept better last night.     Challenges affecting patient progress: low volume of physical activity at present , menopause, and recent lapse in weight management plan due to work, travel, illness or family circumstances.    Pharmacotherapy for weight management: She is currently taking Ozempic with prediabetes or insulin  resistance as the primary indication with adequate clinical response  and without side effects..Patient did not respond or had adverse reaction to metformin in the past..   Assessment and Plan   Treatment Plan For Obesity:  Recommended Dietary Goals  Nevena is currently in the action stage of change. As such, her goal is to continue weight management plan. She has agreed to: continue to work on implementation of reduced calorie nutrition plan (RCNP)  [employer] and Counseling  We discussed the following behavioral modification strategies today: continue to work on maintaining a reduced calorie state, getting the recommended amount of protein, incorporating whole foods, making healthy choices, staying well hydrated and practicing mindfulness when eating., increase protein intake, fibrous foods (25 grams per day for women, 30 grams for men) and water  to improve satiety and decrease hunger signals. , and getting back on track after recent relapse.  Additional education and resources provided today: None  Recommended Physical Activity Goals  Flower has been advised to work up to 150 minutes of moderate intensity aerobic activity a week and strengthening exercises 2-3 times per week for cardiovascular health, weight loss maintenance and preservation of  muscle mass.  She has agreed to :  Think about enjoyable ways to increase daily physical activity and overcoming barriers to exercise, Increase physical activity in their day and reduce sedentary time (increase NEAT)., Increase volume of  physical activity to a goal of 240 minutes a week, and Combine aerobic and strengthening exercises for efficiency and improved cardiometabolic health.  Medical Interventions and Pharmacotherapy  We discussed various medication options to help [facility] with her weight loss efforts and we both agreed to : Adequate clinical response to anti-obesity medication, continue current regimen  Associated Conditions Impacted by Obesity Treatment  Assessment & Plan Essential hypertension Blood pressure management is crucial due to the increased risk of complications such as stroke, kidney disease, and heart failure, especially in minority groups. Her blood pressure today was was elevated on arrival but after rechecked it it was 132/65 which is close to the target of less than 130/80. She is currently on losartan , hydrochlorothiazide , and amlodipine . Stress and recent life events may have contributed to fluctuations in her blood pressure. Emphasized the importance of maintaining independence and preventing complications through effective blood pressure control. - Continue current antihypertensive medications: losartan , hydrochlorothiazide , and amlodipine  - Reinforce lifestyle modifications to aid in blood pressure control OSA (obstructive sleep apnea) - mild on CPAP Mild on PAP with good adherence.  Losing 15% of body weight may reduce AHI.  Continue current weight management strategy Prediabetes Most recent A1c is  Lab Results  Component Value Date   HGBA1C 6.2 (H) 09/19/2023   HGBA1C 6.0 (H) 11/19/2020    Patient aware of disease state and risk of progression. This may contribute to abnormal cravings, fatigue and diabetic complications without having diabetes.   We have discussed treatment options which include: losing 7 to 10% of body weight, increasing physical activity to a goal of 150 minutes a week at moderate intensity.  Advised to maintain a diet low on simple and processed carbohydrates.  Has  been educated on the carb insulin  model for obesity  She had side effects to metformin in the past.  She is now on Ozempic 0.25 mg once a week  Pure hypercholesterolemia LDL is not at goal. Elevated LDL may be secondary to nutrition, genetics and spillover effect from excess adiposity. Recommended LDL goal is <70 to reduce the risk of fatty streaks and the progression to obstructive ASCVD in the future.   Her 10 year risk is: The 10-year ASCVD risk score (Arnett DK, et al., 2019) is: 9.8%  Lab Results  Component Value Date   CHOL 169 09/19/2023   HDL 71 09/19/2023   LDLCALC 83 09/19/2023   TRIG 83 09/19/2023    Continue weight loss therapy, losing 10% or more of body weight may improve condition.  May benefit from increasing statin intensity for an LDL goal of less than 70 consider intermediate cardiovascular risk    Class 3 severe obesity with serious comorbidity and body mass index (BMI) of 40.0 to 44.9 in adult, unspecified obesity type (HCC) Obesity management is ongoing. She has maintained her weight despite recent life events, such as her son's wedding, which disrupted her routine. She is following a 1200 calorie plan but only adheres to it 30% of the time and has not been exercising regularly. She is on Ozempic, but her appetite has increased after reducing the dose. The focus is on lifestyle changes, including diet and exercise, to achieve further weight loss. The goal is to lose 10-15% of her body weight to improve  associated conditions like prediabetes, hypertension, and sleep apnea. - Encourage adherence to a 1200 calorie diet plan - Promote regular exercise, including strength training - Continue Ozempic as prescribed - Encourage meal prepping and planning - Advise to avoid sugary drinks and artificial sweeteners - Reinforce the importance of lifestyle changes for weight management          Objective   Physical Exam:  Blood pressure 132/65, pulse 68, temperature  98.2 F (36.8 C), height 5' 5.5 (1.664 m), weight 247 lb (112 kg), SpO2 99%. Body mass index is 40.48 kg/m.  General: She is overweight, cooperative, alert, well developed, and in no acute distress. PSYCH: Has normal mood, affect and thought process.   HEENT: EOMI, sclerae are anicteric. Lungs: Normal breathing effort, no conversational dyspnea. Extremities: No edema.  Neurologic: No gross sensory or motor deficits. No tremors or fasciculations noted.    Diagnostic Data Reviewed:  BMET    Component Value Date/Time   NA 142 09/19/2023 0942   K 4.1 09/19/2023 0942   CL 103 09/19/2023 0942   CO2 24 09/19/2023 0942   GLUCOSE 93 09/19/2023 0942   GLUCOSE 158 (H) 12/02/2020 0304   BUN 19 09/19/2023 0942   CREATININE 0.86 09/19/2023 0942   CALCIUM  9.6 09/19/2023 0942   GFRNONAA >60 12/02/2020 0304   GFRAA  10/24/2008 1246    >60        The eGFR has been calculated using the MDRD equation. This calculation has not been validated in all clinical situations. eGFR's persistently <60 mL/min signify possible Chronic Kidney Disease.   Lab Results  Component Value Date   HGBA1C 6.2 (H) 09/19/2023   HGBA1C 6.0 (H) 11/19/2020   Lab Results  Component Value Date   INSULIN  9.7 09/19/2023   Lab Results  Component Value Date   TSH 2.280 09/19/2023   CBC    Component Value Date/Time   WBC 6.6 09/19/2023 0942   WBC 12.1 (H) 12/02/2020 0304   RBC 4.58 09/19/2023 0942   RBC 3.72 (L) 12/02/2020 0304   HGB 11.8 09/19/2023 0942   HCT 39.4 09/19/2023 0942   PLT 239 09/19/2023 0942   MCV 86 09/19/2023 0942   MCH 25.8 (L) 09/19/2023 0942   MCH 26.1 12/02/2020 0304   MCHC 29.9 (L) 09/19/2023 0942   MCHC 31.3 12/02/2020 0304   RDW 13.5 09/19/2023 0942   Iron Studies    Component Value Date/Time   IRON 48 09/19/2023 0942   TIBC 329 09/19/2023 0942   FERRITIN 45 09/19/2023 0942   IRONPCTSAT 15 09/19/2023 0942   Lipid Panel     Component Value Date/Time   CHOL 169  09/19/2023 0942   TRIG 83 09/19/2023 0942   HDL 71 09/19/2023 0942   LDLCALC 83 09/19/2023 0942   Hepatic Function Panel     Component Value Date/Time   PROT 7.3 09/19/2023 0942   ALBUMIN 4.3 09/19/2023 0942   AST 24 09/19/2023 0942   ALT 19 09/19/2023 0942   ALKPHOS 112 09/19/2023 0942   BILITOT 0.3 09/19/2023 0942   BILIDIR <0.10 01/12/2023 1501      Component Value Date/Time   TSH 2.280 09/19/2023 0942   Nutritional Lab Results  Component Value Date   VD25OH 54.7 09/19/2023    Medications: Outpatient Encounter Medications as of 01/04/2024  Medication Sig   Accu-Chek Softclix Lancets lancets Use to check your blood sugar finger stick once a day Dx Code E11.9 for 90 days   amLODipine  (NORVASC ) 10  MG tablet Take 10 mg by mouth daily.   atorvastatin  (LIPITOR) 20 MG tablet Take 20 mg by mouth daily.   Blood Glucose Monitoring Suppl (ACCU-CHEK GUIDE) w/Device KIT as directed to check blood sugar once a day Dx Code E11.9   cholecalciferol (VITAMIN D ) 25 MCG (1000 UNIT) tablet Take 1,000 Units by mouth daily.   Coenzyme Q10 (COQ10) 100 MG CAPS Take 100 mg by mouth daily.   Cyanocobalamin  (B-12 PO) Take 1 tablet by mouth daily.   Insulin  Pen Needle (RELION MINI PEN NEEDLES) 31G X 6 MM MISC Inject 1 Needle into the skin once a week.   levothyroxine  (SYNTHROID ) 150 MCG tablet Take 150 mcg by mouth daily before breakfast.   liothyronine (CYTOMEL) 5 MCG tablet Take 5 mcg by mouth daily.   losartan -hydrochlorothiazide  (HYZAAR) 100-25 MG tablet Take 1 tablet by mouth daily.    Magnesium  250 MG TABS Take 250 mg by mouth in the morning and at bedtime.   meloxicam (MOBIC) 15 MG tablet Take 1 tablet by mouth daily.   Omega-3 Fatty Acids (FISH OIL) 1000 MG CAPS 1 capsule Orally Once a day for 30 day(s)   Safety Seal Miscellaneous MISC Rx Medrock Minoxidil 7% Clobetasol  0.05% solution - apply to the affected areas of the scalp QAM.   Semaglutide,0.25 or 0.5MG /DOS, (OZEMPIC, 0.25 OR 0.5  MG/DOSE,) 2 MG/1.5ML SOPN Inject 0.25 mg into the skin once a week.   zinc gluconate 50 MG tablet Take 50 mg by mouth daily.   No facility-administered encounter medications on file as of 01/04/2024.     Follow-Up   Return in about 3 weeks (around 01/25/2024) for For Weight Mangement with Dr. Francyne.SABRA She was informed of the importance of frequent follow up visits to maximize her success with intensive lifestyle modifications for her multiple health conditions.  Attestation Statement   Reviewed by clinician on day of visit: allergies, medications, problem list, medical history, surgical history, family history, social history, and previous encounter notes.     Lucas Francyne, MD

## 2024-01-04 NOTE — Assessment & Plan Note (Signed)
 Mild on PAP with good adherence.  Losing 15% of body weight may reduce AHI.  Continue current weight management strategy

## 2024-01-04 NOTE — Assessment & Plan Note (Signed)
 Obesity management is ongoing. She has maintained her weight despite recent life events, such as her son's wedding, which disrupted her routine. She is following a 1200 calorie plan but only adheres to it 30% of the time and has not been exercising regularly. She is on Ozempic, but her appetite has increased after reducing the dose. The focus is on lifestyle changes, including diet and exercise, to achieve further weight loss. The goal is to lose 10-15% of her body weight to improve associated conditions like prediabetes, hypertension, and sleep apnea. - Encourage adherence to a 1200 calorie diet plan - Promote regular exercise, including strength training - Continue Ozempic as prescribed - Encourage meal prepping and planning - Advise to avoid sugary drinks and artificial sweeteners - Reinforce the importance of lifestyle changes for weight management

## 2024-01-04 NOTE — Assessment & Plan Note (Signed)
 Blood pressure management is crucial due to the increased risk of complications such as stroke, kidney disease, and heart failure, especially in minority groups. Her blood pressure today was was elevated on arrival but after rechecked it it was 132/65 which is close to the target of less than 130/80. She is currently on losartan , hydrochlorothiazide , and amlodipine . Stress and recent life events may have contributed to fluctuations in her blood pressure. Emphasized the importance of maintaining independence and preventing complications through effective blood pressure control. - Continue current antihypertensive medications: losartan , hydrochlorothiazide , and amlodipine  - Reinforce lifestyle modifications to aid in blood pressure control

## 2024-01-04 NOTE — Assessment & Plan Note (Signed)
 LDL is not at goal. Elevated LDL may be secondary to nutrition, genetics and spillover effect from excess adiposity. Recommended LDL goal is <70 to reduce the risk of fatty streaks and the progression to obstructive ASCVD in the future.   Her 10 year risk is: The 10-year ASCVD risk score (Arnett DK, et al., 2019) is: 9.8%  Lab Results  Component Value Date   CHOL 169 09/19/2023   HDL 71 09/19/2023   LDLCALC 83 09/19/2023   TRIG 83 09/19/2023    Continue weight loss therapy, losing 10% or more of body weight may improve condition.  May benefit from increasing statin intensity for an LDL goal of less than 70 consider intermediate cardiovascular risk

## 2024-01-09 ENCOUNTER — Ambulatory Visit (INDEPENDENT_AMBULATORY_CARE_PROVIDER_SITE_OTHER): Admitting: Internal Medicine

## 2024-01-26 ENCOUNTER — Encounter (INDEPENDENT_AMBULATORY_CARE_PROVIDER_SITE_OTHER): Payer: Self-pay | Admitting: Internal Medicine

## 2024-01-26 ENCOUNTER — Ambulatory Visit (INDEPENDENT_AMBULATORY_CARE_PROVIDER_SITE_OTHER): Payer: Self-pay | Admitting: Internal Medicine

## 2024-01-26 VITALS — BP 143/70 | HR 65 | Temp 98.0°F | Ht 65.5 in | Wt 248.0 lb

## 2024-01-26 DIAGNOSIS — E7800 Pure hypercholesterolemia, unspecified: Secondary | ICD-10-CM | POA: Diagnosis not present

## 2024-01-26 DIAGNOSIS — Z6841 Body Mass Index (BMI) 40.0 and over, adult: Secondary | ICD-10-CM

## 2024-01-26 DIAGNOSIS — R7303 Prediabetes: Secondary | ICD-10-CM

## 2024-01-26 DIAGNOSIS — I10 Essential (primary) hypertension: Secondary | ICD-10-CM

## 2024-01-26 DIAGNOSIS — E66813 Obesity, class 3: Secondary | ICD-10-CM | POA: Diagnosis not present

## 2024-01-26 NOTE — Assessment & Plan Note (Signed)
 Recent weight gain of one pound after reducing Ozempic dose. Increased cravings for sweets and carbohydrates. Emphasis on lifestyle changes including diet and physical activity. Discussion on the importance of environmental control and behavioral modification to manage cravings and prevent weight regain. Highlighted the need for consistent physical activity and dietary choices to support weight loss. Encouraged tracking caloric intake to ensure it remains under 1600 calories per day for weight loss. Discussed the importance of maintaining a consistent routine and the role of self-control in managing weight. - Increased Ozempic to 0.5 mg. - Encouraged home cooking and healthy eating habits. - Advised tracking caloric intake to ensure it remains under 1600 calories per day. - Encouraged physical activity aiming for 240 minutes per week and 5000-10000 steps per day. - Discussed behavioral modifications to control cravings and environmental triggers.

## 2024-01-26 NOTE — Assessment & Plan Note (Signed)
 LDL is not at goal. Elevated LDL may be secondary to nutrition, genetics and spillover effect from excess adiposity. Recommended LDL goal is <70 to reduce the risk of fatty streaks and the progression to obstructive ASCVD in the future.   Her 10 year risk is: The 10-year ASCVD risk score (Arnett DK, et al., 2019) is: 11.5%  Lab Results  Component Value Date   CHOL 169 09/19/2023   HDL 71 09/19/2023   LDLCALC 83 09/19/2023   TRIG 83 09/19/2023    Continue weight loss therapy, losing 10% or more of body weight may improve condition.  May benefit from increasing statin intensity for an LDL goal of less than 70 considering intermediate cardiovascular risk

## 2024-01-26 NOTE — Progress Notes (Signed)
 Office: [phone]  /  Fax: [phone]  Weight Summary and Body Composition Analysis (BIA)  Vitals Temp: 98 F (36.7 C) BP: (!) 143/70 Pulse Rate: 65 SpO2: 100 %   Anthropometric Measurements Height: 5' 5.5 (1.664 m) Weight: 248 lb (112.5 kg) BMI (Calculated): 40.63 Weight at Last Visit: 247 lb Weight Lost Since Last Visit: 0 lb Weight Gained Since Last Visit: 1 lb Starting Weight: 263 lb Total Weight Loss (lbs): 15 lb (6.804 kg) Peak Weight: 270 lb   No data recorded  RMR: 1627  Today's Visit #: 6  Starting Date: 09/19/23   Subjective   Chief Complaint: Obesity  Interval History Discussed the use of AI scribe software for clinical note transcription with the patient, who gave verbal consent to proceed.  History of Present Illness Ashley York is a 68 year old female with obesity who presents for medical weight management.  Since last office visit she has gained 1 pound.  She reports following a 1200-calorie nutrition plan with fair adherence she has not been tracking does not feel like she is getting the right amount of protein does 2 meals a day as a normal.  She has been focusing on improving her diet by cooking more at home and eating out less. She typically eats twice a day and sometimes eats late at night due to her being a 'night owl.' She has been trying to incorporate healthier recipes, such as 'skinny taste' recipes, and has made egg white muffins with broccoli.  In terms of physical activity, she has been walking more, aiming for 5,000 steps a day, and sometimes achieving 6,000 steps. She enjoys swimming but has not been to the pool recently. She acknowledges the need to increase her physical activity levels.  She reports that her blood pressure has been slightly elevated. She takes her blood pressure medication every night and notes that her blood pressure tends to be high during visits but decreases afterward. She has lost 15 pounds since  starting the program in June, with a reduction in body fat percentage from 54% to 51%.  She is aware of the importance of controlling her environment to avoid triggers for unhealthy eating, such as keeping sweets out of the house. She has been working on behavioral modifications to manage cravings and emotional eating.     Challenges affecting patient progress: having difficulty focusing on healthy eating, difficulty maintaining a reduced calorie state, and low volume of physical activity at present .    Pharmacotherapy for weight management: She is currently taking Ozempic with prediabetes or insulin  resistance as the primary indication with adequate clinical response  and without side effects..Patient did not respond or had adverse reaction to metformin in the past..   Assessment and Plan   Treatment Plan For Obesity:  Recommended Dietary Goals  Ashley York is currently in the action stage of change. As such, her goal is to continue weight management plan. She has agreed to: incorporate 1-2 meal replacements a day for convenience  and continue current plan  Behavioral Health and Counseling  We discussed the following behavioral modification strategies today: increasing lean protein intake to established goals and avoiding skipping meals.  Additional education and resources provided today: Handout on traveling and holiday eating strategies  Recommended Physical Activity Goals  Ashley York has been advised to work up to 150 minutes of moderate intensity aerobic activity a week and strengthening exercises 2-3 times per week for cardiovascular health, weight loss maintenance and preservation of muscle mass.  She has agreed to :  Increase and monitor steps for a goal of 10,000 per day, Increase volume of physical activity to a goal of 240 minutes a week, and Combine aerobic and strengthening exercises for efficiency and improved cardiometabolic health.  Medical Interventions and  Pharmacotherapy  We discussed various medication options to help Ashley York with her weight loss efforts and we both agreed to : Increase Ozempic 0.5 mg once a week  Associated Conditions Impacted by Obesity Treatment  Assessment & Plan Class 3 severe obesity with serious comorbidity and body mass index (BMI) of 40.0 to 44.9 in adult, unspecified obesity type (HCC) Recent weight gain of one pound after reducing Ozempic dose. Increased cravings for sweets and carbohydrates. Emphasis on lifestyle changes including diet and physical activity. Discussion on the importance of environmental control and behavioral modification to manage cravings and prevent weight regain. Highlighted the need for consistent physical activity and dietary choices to support weight loss. Encouraged tracking caloric intake to ensure it remains under 1600 calories per day for weight loss. Discussed the importance of maintaining a consistent routine and the role of self-control in managing weight. - Increased Ozempic to 0.5 mg. - Encouraged home cooking and healthy eating habits. - Advised tracking caloric intake to ensure it remains under 1600 calories per day. - Encouraged physical activity aiming for 240 minutes per week and 5000-10000 steps per day. - Discussed behavioral modifications to control cravings and environmental triggers. Prediabetes Most recent A1c is  Lab Results  Component Value Date   HGBA1C 6.2 (H) 09/19/2023   HGBA1C 6.0 (H) 11/19/2020    Patient aware of disease state and risk of progression. This may contribute to abnormal cravings, fatigue and diabetic complications without having diabetes.    Advised to maintain a diet low on simple and processed carbohydrates.  Has been educated on the carb insulin  model for obesity  She had side effects to metformin in the past.  She is now on Ozempic 0.25 mg once a week, medication will be increased to 0.5 mg once a week  Essential hypertension Vitals:    01/26/24 1600 01/26/24 1626  BP: (!) 169/70 (!) 143/70    Blood pressure is not at goal for age and risk category.  On amlodipine , losartan  hydrochlorothiazide  without adverse effects.  Most recent renal parameters reviewed which showed stable electrolytes and kidney function.  Advised patient to begin monitoring blood pressure at home to look at trends.  If her blood pressure staying above 130/80 she is to reach out to her primary care team for medication adjustments.  We are increasing semaglutide to 0.5 mg once a week which should help with blood pressure control.  Pure hypercholesterolemia LDL is not at goal. Elevated LDL may be secondary to nutrition, genetics and spillover effect from excess adiposity. Recommended LDL goal is <70 to reduce the risk of fatty streaks and the progression to obstructive ASCVD in the future.   Her 10 year risk is: The 10-year ASCVD risk score (Arnett DK, et al., 2019) is: 11.5%  Lab Results  Component Value Date   CHOL 169 09/19/2023   HDL 71 09/19/2023   LDLCALC 83 09/19/2023   TRIG 83 09/19/2023    Continue weight loss therapy, losing 10% or more of body weight may improve condition.  May benefit from increasing statin intensity for an LDL goal of less than 70 considering intermediate cardiovascular risk            Objective   Physical Exam:  Blood pressure (!) 143/70, pulse 65, temperature 98 F (36.7 C), height 5' 5.5 (1.664 m), weight 248 lb (112.5 kg), SpO2 100%. Body mass index is 40.64 kg/m.  General: She is overweight, cooperative, alert, well developed, and in no acute distress. PSYCH: Has normal mood, affect and thought process.   HEENT: EOMI, sclerae are anicteric. Lungs: Normal breathing effort, no conversational dyspnea. Extremities: No edema.  Neurologic: No gross sensory or motor deficits. No tremors or fasciculations noted.    Diagnostic Data Reviewed:  BMET    Component Value Date/Time   NA 142 09/19/2023 0942    K 4.1 09/19/2023 0942   CL 103 09/19/2023 0942   CO2 24 09/19/2023 0942   GLUCOSE 93 09/19/2023 0942   GLUCOSE 158 (H) 12/02/2020 0304   BUN 19 09/19/2023 0942   CREATININE 0.86 09/19/2023 0942   CALCIUM  9.6 09/19/2023 0942   GFRNONAA >60 12/02/2020 0304   GFRAA  10/24/2008 1246    >60        The eGFR has been calculated using the MDRD equation. This calculation has not been validated in all clinical situations. eGFR's persistently <60 mL/min signify possible Chronic Kidney Disease.   Lab Results  Component Value Date   HGBA1C 6.2 (H) 09/19/2023   HGBA1C 6.0 (H) 11/19/2020   Lab Results  Component Value Date   INSULIN  9.7 09/19/2023   Lab Results  Component Value Date   TSH 2.280 09/19/2023   CBC    Component Value Date/Time   WBC 6.6 09/19/2023 0942   WBC 12.1 (H) 12/02/2020 0304   RBC 4.58 09/19/2023 0942   RBC 3.72 (L) 12/02/2020 0304   HGB 11.8 09/19/2023 0942   HCT 39.4 09/19/2023 0942   PLT 239 09/19/2023 0942   MCV 86 09/19/2023 0942   MCH 25.8 (L) 09/19/2023 0942   MCH 26.1 12/02/2020 0304   MCHC 29.9 (L) 09/19/2023 0942   MCHC 31.3 12/02/2020 0304   RDW 13.5 09/19/2023 0942   Iron Studies    Component Value Date/Time   IRON 48 09/19/2023 0942   TIBC 329 09/19/2023 0942   FERRITIN 45 09/19/2023 0942   IRONPCTSAT 15 09/19/2023 0942   Lipid Panel     Component Value Date/Time   CHOL 169 09/19/2023 0942   TRIG 83 09/19/2023 0942   HDL 71 09/19/2023 0942   LDLCALC 83 09/19/2023 0942   Hepatic Function Panel     Component Value Date/Time   PROT 7.3 09/19/2023 0942   ALBUMIN 4.3 09/19/2023 0942   AST 24 09/19/2023 0942   ALT 19 09/19/2023 0942   ALKPHOS 112 09/19/2023 0942   BILITOT 0.3 09/19/2023 0942   BILIDIR <0.10 01/12/2023 1501      Component Value Date/Time   TSH 2.280 09/19/2023 0942   Nutritional Lab Results  Component Value Date   VD25OH 54.7 09/19/2023    Medications: Outpatient Encounter Medications as of 01/26/2024   Medication Sig   Accu-Chek Softclix Lancets lancets Use to check your blood sugar finger stick once a day Dx Code E11.9 for 90 days   amLODipine  (NORVASC ) 10 MG tablet Take 10 mg by mouth daily.   atorvastatin  (LIPITOR) 20 MG tablet Take 20 mg by mouth daily.   Blood Glucose Monitoring Suppl (ACCU-CHEK GUIDE) w/Device KIT as directed to check blood sugar once a day Dx Code E11.9   cholecalciferol (VITAMIN D ) 25 MCG (1000 UNIT) tablet Take 1,000 Units by mouth daily.   Coenzyme Q10 (COQ10) 100 MG CAPS Take  100 mg by mouth daily.   Cyanocobalamin  (B-12 PO) Take 1 tablet by mouth daily.   Insulin  Pen Needle (RELION MINI PEN NEEDLES) 31G X 6 MM MISC Inject 1 Needle into the skin once a week.   levothyroxine  (SYNTHROID ) 150 MCG tablet Take 150 mcg by mouth daily before breakfast.   liothyronine (CYTOMEL) 5 MCG tablet Take 5 mcg by mouth daily.   losartan -hydrochlorothiazide  (HYZAAR) 100-25 MG tablet Take 1 tablet by mouth daily.    Magnesium  250 MG TABS Take 250 mg by mouth in the morning and at bedtime.   meloxicam (MOBIC) 15 MG tablet Take 1 tablet by mouth daily.   Omega-3 Fatty Acids (FISH OIL) 1000 MG CAPS 1 capsule Orally Once a day for 30 day(s)   Safety Seal Miscellaneous MISC Rx Medrock Minoxidil 7% Clobetasol  0.05% solution - apply to the affected areas of the scalp QAM.   Semaglutide,0.25 or 0.5MG /DOS, (OZEMPIC, 0.25 OR 0.5 MG/DOSE,) 2 MG/1.5ML SOPN Inject 0.25 mg into the skin once a week.   zinc gluconate 50 MG tablet Take 50 mg by mouth daily.   No facility-administered encounter medications on file as of 01/26/2024.     Follow-Up   Return in about 4 weeks (around 02/23/2024) for For Weight Mangement with Dr. Francyne.SABRA She was informed of the importance of frequent follow up visits to maximize her success with intensive lifestyle modifications for her multiple health conditions.  Attestation Statement   Reviewed by clinician on day of visit: allergies, medications, problem  list, medical history, surgical history, family history, social history, and previous encounter notes.     Lucas Francyne, MD

## 2024-01-26 NOTE — Assessment & Plan Note (Signed)
 Vitals:   01/26/24 1600 01/26/24 1626  BP: (!) 169/70 (!) 143/70    Blood pressure is not at goal for age and risk category.  On amlodipine , losartan  hydrochlorothiazide  without adverse effects.  Most recent renal parameters reviewed which showed stable electrolytes and kidney function.  Advised patient to begin monitoring blood pressure at home to look at trends.  If her blood pressure staying above 130/80 she is to reach out to her primary care team for medication adjustments.  We are increasing semaglutide to 0.5 mg once a week which should help with blood pressure control.

## 2024-01-26 NOTE — Assessment & Plan Note (Signed)
 Most recent A1c is  Lab Results  Component Value Date   HGBA1C 6.2 (H) 09/19/2023   HGBA1C 6.0 (H) 11/19/2020    Patient aware of disease state and risk of progression. This may contribute to abnormal cravings, fatigue and diabetic complications without having diabetes.    Advised to maintain a diet low on simple and processed carbohydrates.  Has been educated on the carb insulin  model for obesity  She had side effects to metformin in the past.  She is now on Ozempic 0.25 mg once a week, medication will be increased to 0.5 mg once a week

## 2024-02-29 ENCOUNTER — Encounter (INDEPENDENT_AMBULATORY_CARE_PROVIDER_SITE_OTHER): Payer: Self-pay | Admitting: Internal Medicine

## 2024-02-29 ENCOUNTER — Ambulatory Visit (INDEPENDENT_AMBULATORY_CARE_PROVIDER_SITE_OTHER): Admitting: Internal Medicine

## 2024-02-29 VITALS — BP 131/73 | HR 63 | Temp 98.3°F | Ht 65.5 in | Wt 241.0 lb

## 2024-02-29 DIAGNOSIS — E66812 Obesity, class 2: Secondary | ICD-10-CM | POA: Diagnosis not present

## 2024-02-29 DIAGNOSIS — G4733 Obstructive sleep apnea (adult) (pediatric): Secondary | ICD-10-CM | POA: Diagnosis not present

## 2024-02-29 DIAGNOSIS — R7303 Prediabetes: Secondary | ICD-10-CM | POA: Diagnosis not present

## 2024-02-29 DIAGNOSIS — Z6839 Body mass index (BMI) 39.0-39.9, adult: Secondary | ICD-10-CM

## 2024-02-29 DIAGNOSIS — I10 Essential (primary) hypertension: Secondary | ICD-10-CM | POA: Diagnosis not present

## 2024-02-29 DIAGNOSIS — Z72821 Inadequate sleep hygiene: Secondary | ICD-10-CM | POA: Insufficient documentation

## 2024-02-29 NOTE — Assessment & Plan Note (Signed)
 Most recent A1c is  Lab Results  Component Value Date   HGBA1C 6.2 (H) 09/19/2023   HGBA1C 6.0 (H) 11/19/2020    Patient aware of disease state and risk of progression. This may contribute to abnormal cravings, fatigue and diabetic complications without having diabetes.   In addition to maintaining a low-carb diet and increasing physical activity she is on semaglutide 0.5 mg once a week without any adverse effects.  Continue current management strategy

## 2024-02-29 NOTE — Assessment & Plan Note (Signed)
 Mild on PAP with good adherence.  Losing 15% of body weight may reduce AHI.  Continue current weight management strategy

## 2024-02-29 NOTE — Assessment & Plan Note (Signed)
 Counseled on sleep and sleep hygiene

## 2024-02-29 NOTE — Assessment & Plan Note (Signed)
 Management is ongoing with a focus on weight loss and body composition improvement. She has lost seven pounds since the last visit, following a 1200 calorie nutrition plan 60% of the time, and increasing her steps. Currently on semaglutide 0.5 mg, which has improved her appetite control and reduced nausea. Body fat percentage has decreased from 54% to 48%, with a goal of 36% or less. Visceral fat has decreased from 19 to 16, with a goal of under 10. Muscle mass is stable, indicating effective fat loss without muscle loss. Reports fatigue, possibly due to inadequate nutrition and sleep patterns. - Continue semaglutide 0.5 mg. - Encouraged adherence to a balanced diet with adequate protein, fruits, and vegetables. - Advised against eating only one meal a day; recommended at least two meals daily. - Encouraged regular physical activity. - Ordered kidney function and electrolytes tests. - Ordered A1c test to assess prediabetes status.

## 2024-02-29 NOTE — Progress Notes (Signed)
 Office: [phone]  /  Fax: [phone]  Weight Summary and Body Composition Analysis (BIA)  Vitals Temp: 98.3 F (36.8 C) BP: 131/73 Pulse Rate: 63 SpO2: 100 %   Anthropometric Measurements Height: 5' 5.5 (1.664 m) Weight: 241 lb (109.3 kg) BMI (Calculated): 39.48 Weight at Last Visit: 248 lb Weight Lost Since Last Visit: 7 lb Weight Gained Since Last Visit: 0 lb Starting Weight: 263 lb Total Weight Loss (lbs): 22 lb (9.979 kg) Peak Weight: 270 lb   Body Composition  Body Fat %: 48.1 % Fat Mass (lbs): 116 lbs Muscle Mass (lbs): 118.6 lbs Total Body Water  (lbs): 87.4 lbs Visceral Fat Rating : 16    RMR: 1627  Today's Visit #: 7  Starting Date: 09/19/23   Subjective   Chief Complaint: Obesity  Interval History Discussed the use of AI scribe software for clinical note transcription with the patient, who gave verbal consent to proceed.  History of Present Illness Ashley York is a 68 year old female with obesity who presents for medical weight management.  Since her last visit, she has lost seven pounds. She follows a 1200 calorie nutrition plan about 60% of the time and has been increasing her daily steps. She is currently taking semaglutide, injecting 2 mg. She reports that her appetite and nausea have improved. She reports that she cannot eat as much and ensures to eat daily, although sometimes only one or two meals a day.  She experiences a disrupted sleep schedule, often going to bed at 3 or 4 AM and waking up around 9 or 10 AM. She uses a CPAP machine every night for sleep apnea. She feels tired frequently, which she attributes to her irregular sleep schedule and possibly not eating enough.  She experiences back and hip pain, especially when walking long distances, which she attributes to her weight. Despite this, she has been active, noting a significant increase in steps during a recent trip to [place] . Her family history includes diabetes,  with her father requiring insulin .     Challenges affecting patient progress: orthopedic problems, medical conditions or chronic pain affecting mobility and inadequate sleep .    Pharmacotherapy for weight management: She is currently taking Ozempic with prediabetes or insulin  resistance as the primary indication with adequate clinical response  and without side effects..Patient did not respond or had adverse reaction to metformin in the past..   Assessment and Plan   Treatment Plan For Obesity:  Recommended Dietary Goals  Ashley York is currently in the action stage of change. As such, her goal is to continue weight management plan. She has agreed to: continue current plan  Behavioral Health and Counseling  We discussed the following behavioral modification strategies today: continue to work on maintaining a reduced calorie state, getting the recommended amount of protein, incorporating whole foods, making healthy choices, staying well hydrated and practicing mindfulness when eating. and increase protein intake, fibrous foods (25 grams per day for women, 30 grams for men) and water  to improve satiety and decrease hunger signals. .  Additional education and resources provided today: None  Recommended Physical Activity Goals  Ashley York has been advised to work up to 150 minutes of moderate intensity aerobic activity a week and strengthening exercises 2-3 times per week for cardiovascular health, weight loss maintenance and preservation of muscle mass.  She has agreed to :  Think about enjoyable ways to increase daily physical activity and overcoming barriers to exercise, Increase physical activity in their day and  reduce sedentary time (increase NEAT)., Increase volume of physical activity to a goal of 240 minutes a week, and Combine aerobic and strengthening exercises for efficiency and improved cardiometabolic health.  Medical Interventions and Pharmacotherapy  We discussed various  medication options to help Ashley York with her weight loss efforts and we both agreed to : Adequate clinical response to anti-obesity medication, continue current regimen  Associated Conditions Impacted by Obesity Treatment  Assessment & Plan Essential hypertension Vitals:   02/29/24 1300  BP: 131/73    Blood pressure is at goal for age and risk category.  On amlodipine , losartan  hydrochlorothiazide  without adverse effects.  Most recent renal parameters reviewed which showed stable electrolytes and kidney function.  Blood pressure has improved.  She is experiencing some fatigue so we will check renal parameters and electrolytes that she is on thiazide diuretic.  OSA (obstructive sleep apnea) - mild on CPAP Mild on PAP with good adherence.  Losing 15% of body weight may reduce AHI.  Continue current weight management strategy Prediabetes Most recent A1c is  Lab Results  Component Value Date   HGBA1C 6.2 (H) 09/19/2023   HGBA1C 6.0 (H) 11/19/2020    Patient aware of disease state and risk of progression. This may contribute to abnormal cravings, fatigue and diabetic complications without having diabetes.   In addition to maintaining a low-carb diet and increasing physical activity she is on semaglutide 0.5 mg once a week without any adverse effects.  Continue current management strategy  Class 2 severe obesity with serious comorbidity and body mass index (BMI) of 39.0 to 39.9 in adult, unspecified obesity type Management is ongoing with a focus on weight loss and body composition improvement. She has lost seven pounds since the last visit, following a 1200 calorie nutrition plan 60% of the time, and increasing her steps. Currently on semaglutide 0.5 mg, which has improved her appetite control and reduced nausea. Body fat percentage has decreased from 54% to 48%, with a goal of 36% or less. Visceral fat has decreased from 19 to 16, with a goal of under 10. Muscle mass is stable, indicating  effective fat loss without muscle loss. Reports fatigue, possibly due to inadequate nutrition and sleep patterns. - Continue semaglutide 0.5 mg. - Encouraged adherence to a balanced diet with adequate protein, fruits, and vegetables. - Advised against eating only one meal a day; recommended at least two meals daily. - Encouraged regular physical activity. - Ordered kidney function and electrolytes tests. - Ordered A1c test to assess prediabetes status. Inadequate sleep hygiene Counseled on sleep and sleep hygiene          Objective   Physical Exam:  Blood pressure 131/73, pulse 63, temperature 98.3 F (36.8 C), height 5' 5.5 (1.664 m), weight 241 lb (109.3 kg), SpO2 100%. Body mass index is 39.49 kg/m.  General: She is overweight, cooperative, alert, well developed, and in no acute distress. PSYCH: Has normal mood, affect and thought process.   HEENT: EOMI, sclerae are anicteric. Lungs: Normal breathing effort, no conversational dyspnea. Extremities: No edema.  Neurologic: No gross sensory or motor deficits. No tremors or fasciculations noted.    Diagnostic Data Reviewed:  BMET    Component Value Date/Time   NA 142 09/19/2023 0942   K 4.1 09/19/2023 0942   CL 103 09/19/2023 0942   CO2 24 09/19/2023 0942   GLUCOSE 93 09/19/2023 0942   GLUCOSE 158 (H) 12/02/2020 0304   BUN 19 09/19/2023 0942   CREATININE 0.86 09/19/2023 9057  CALCIUM  9.6 09/19/2023 0942   GFRNONAA >60 12/02/2020 0304   GFRAA  10/24/2008 1246    >60        The eGFR has been calculated using the MDRD equation. This calculation has not been validated in all clinical situations. eGFR's persistently <60 mL/min signify possible Chronic Kidney Disease.   Lab Results  Component Value Date   HGBA1C 6.2 (H) 09/19/2023   HGBA1C 6.0 (H) 11/19/2020   Lab Results  Component Value Date   INSULIN  9.7 09/19/2023   Lab Results  Component Value Date   TSH 2.280 09/19/2023   CBC    Component Value  Date/Time   WBC 6.6 09/19/2023 0942   WBC 12.1 (H) 12/02/2020 0304   RBC 4.58 09/19/2023 0942   RBC 3.72 (L) 12/02/2020 0304   HGB 11.8 09/19/2023 0942   HCT 39.4 09/19/2023 0942   PLT 239 09/19/2023 0942   MCV 86 09/19/2023 0942   MCH 25.8 (L) 09/19/2023 0942   MCH 26.1 12/02/2020 0304   MCHC 29.9 (L) 09/19/2023 0942   MCHC 31.3 12/02/2020 0304   RDW 13.5 09/19/2023 0942   Iron Studies    Component Value Date/Time   IRON 48 09/19/2023 0942   TIBC 329 09/19/2023 0942   FERRITIN 45 09/19/2023 0942   IRONPCTSAT 15 09/19/2023 0942   Lipid Panel     Component Value Date/Time   CHOL 169 09/19/2023 0942   TRIG 83 09/19/2023 0942   HDL 71 09/19/2023 0942   LDLCALC 83 09/19/2023 0942   Hepatic Function Panel     Component Value Date/Time   PROT 7.3 09/19/2023 0942   ALBUMIN 4.3 09/19/2023 0942   AST 24 09/19/2023 0942   ALT 19 09/19/2023 0942   ALKPHOS 112 09/19/2023 0942   BILITOT 0.3 09/19/2023 0942   BILIDIR <0.10 01/12/2023 1501      Component Value Date/Time   TSH 2.280 09/19/2023 0942   Nutritional Lab Results  Component Value Date   VD25OH 54.7 09/19/2023    Medications: Outpatient Encounter Medications as of 02/29/2024  Medication Sig   Accu-Chek Softclix Lancets lancets Use to check your blood sugar finger stick once a day Dx Code E11.9 for 90 days   amLODipine  (NORVASC ) 10 MG tablet Take 10 mg by mouth daily.   atorvastatin  (LIPITOR) 20 MG tablet Take 20 mg by mouth daily.   Blood Glucose Monitoring Suppl (ACCU-CHEK GUIDE) w/Device KIT as directed to check blood sugar once a day Dx Code E11.9   cholecalciferol (VITAMIN D ) 25 MCG (1000 UNIT) tablet Take 1,000 Units by mouth daily.   Coenzyme Q10 (COQ10) 100 MG CAPS Take 100 mg by mouth daily.   Cyanocobalamin  (B-12 PO) Take 1 tablet by mouth daily.   Insulin  Pen Needle (RELION MINI PEN NEEDLES) 31G X 6 MM MISC Inject 1 Needle into the skin once a week.   levothyroxine  (SYNTHROID ) 150 MCG tablet Take 150  mcg by mouth daily before breakfast.   liothyronine (CYTOMEL) 5 MCG tablet Take 5 mcg by mouth daily.   losartan -hydrochlorothiazide  (HYZAAR) 100-25 MG tablet Take 1 tablet by mouth daily.    Magnesium  250 MG TABS Take 250 mg by mouth in the morning and at bedtime.   meloxicam (MOBIC) 15 MG tablet Take 1 tablet by mouth daily.   Omega-3 Fatty Acids (FISH OIL) 1000 MG CAPS 1 capsule Orally Once a day for 30 day(s)   Safety Seal Miscellaneous MISC Rx Medrock Minoxidil 7% Clobetasol  0.05% solution - apply to  the affected areas of the scalp QAM.   Semaglutide,0.25 or 0.5MG /DOS, (OZEMPIC, 0.25 OR 0.5 MG/DOSE,) 2 MG/1.5ML SOPN Inject 0.25 mg into the skin once a week.   zinc gluconate 50 MG tablet Take 50 mg by mouth daily.   No facility-administered encounter medications on file as of 02/29/2024.     Follow-Up   Return in about 4 weeks (around 03/28/2024) for For Weight Mangement with Dr. Francyne.SABRA She was informed of the importance of frequent follow up visits to maximize her success with intensive lifestyle modifications for her multiple health conditions.  Attestation Statement   Reviewed by clinician on day of visit: allergies, medications, problem list, medical history, surgical history, family history, social history, and previous encounter notes.     Lucas Francyne, MD

## 2024-02-29 NOTE — Assessment & Plan Note (Signed)
 Vitals:   02/29/24 1300  BP: 131/73    Blood pressure is at goal for age and risk category.  On amlodipine , losartan  hydrochlorothiazide  without adverse effects.  Most recent renal parameters reviewed which showed stable electrolytes and kidney function.  Blood pressure has improved.  She is experiencing some fatigue so we will check renal parameters and electrolytes that she is on thiazide diuretic.

## 2024-03-01 ENCOUNTER — Ambulatory Visit (INDEPENDENT_AMBULATORY_CARE_PROVIDER_SITE_OTHER): Payer: Self-pay | Admitting: Internal Medicine

## 2024-03-01 LAB — CMP14+EGFR
ALT: 12 IU/L (ref 0–32)
AST: 23 IU/L (ref 0–40)
Albumin: 4.1 g/dL (ref 3.9–4.9)
Alkaline Phosphatase: 97 IU/L (ref 49–135)
BUN/Creatinine Ratio: 18 (ref 12–28)
BUN: 14 mg/dL (ref 8–27)
Bilirubin Total: 0.3 mg/dL (ref 0.0–1.2)
CO2: 27 mmol/L (ref 20–29)
Calcium: 9.1 mg/dL (ref 8.7–10.3)
Chloride: 103 mmol/L (ref 96–106)
Creatinine, Ser: 0.77 mg/dL (ref 0.57–1.00)
Globulin, Total: 3.1 g/dL (ref 1.5–4.5)
Glucose: 92 mg/dL (ref 70–99)
Potassium: 3.9 mmol/L (ref 3.5–5.2)
Sodium: 142 mmol/L (ref 134–144)
Total Protein: 7.2 g/dL (ref 6.0–8.5)
eGFR: 84 mL/min/1.73 (ref >59)

## 2024-03-01 LAB — HEMOGLOBIN A1C
Est. average glucose Bld gHb Est-mCnc: 123 mg/dL
Hgb A1c MFr Bld: 5.9 % — ABNORMAL HIGH (ref 4.8–5.6)

## 2024-04-03 ENCOUNTER — Encounter (INDEPENDENT_AMBULATORY_CARE_PROVIDER_SITE_OTHER): Payer: Self-pay | Admitting: Internal Medicine

## 2024-04-03 ENCOUNTER — Ambulatory Visit (INDEPENDENT_AMBULATORY_CARE_PROVIDER_SITE_OTHER): Admitting: Internal Medicine

## 2024-04-03 VITALS — BP 138/84 | HR 78 | Temp 98.0°F | Ht 65.5 in | Wt 240.0 lb

## 2024-04-03 DIAGNOSIS — E7800 Pure hypercholesterolemia, unspecified: Secondary | ICD-10-CM

## 2024-04-03 DIAGNOSIS — Z6839 Body mass index (BMI) 39.0-39.9, adult: Secondary | ICD-10-CM

## 2024-04-03 DIAGNOSIS — E66812 Obesity, class 2: Secondary | ICD-10-CM | POA: Diagnosis not present

## 2024-04-03 DIAGNOSIS — I10 Essential (primary) hypertension: Secondary | ICD-10-CM | POA: Diagnosis not present

## 2024-04-03 DIAGNOSIS — G4733 Obstructive sleep apnea (adult) (pediatric): Secondary | ICD-10-CM | POA: Diagnosis not present

## 2024-04-03 DIAGNOSIS — R7303 Prediabetes: Secondary | ICD-10-CM

## 2024-04-03 NOTE — Progress Notes (Signed)
 "  Office: [phone]  /  Fax: [phone]  Weight Summary and Body Composition Analysis (BIA)  Vitals Temp: 98 F (36.7 C) BP: 138/84 Pulse Rate: 78 SpO2: 100 %   Anthropometric Measurements Height: 5' 5.5 (1.664 m) Weight: 240 lb (108.9 kg) BMI (Calculated): 39.32 Weight at Last Visit: 241 lb Weight Lost Since Last Visit: 1 lb Weight Gained Since Last Visit: 0 lb Starting Weight: 263 lb Total Weight Loss (lbs): 23 lb (10.4 kg) Peak Weight: 270 lb   Body Composition  Body Fat %: 50.4 % Fat Mass (lbs): 121.2 lbs Muscle Mass (lbs): 113.2 lbs Total Body Water  (lbs): 89.4 lbs Visceral Fat Rating : 17    RMR: 1627  Today's Visit #: 8  Starting Date: 09/19/23   Subjective   Chief Complaint: Obesity  Interval History  Discussed the use of AI scribe software for clinical note transcription with the patient, who gave verbal consent to proceed.  History of Present Illness Ashley York is a 69 year old female with obesity-related conditions who presents for medical weight management.  She has a history of hypertension, sleep apnea, prediabetes, and hypercholesterolemia, and is currently pursuing medical weight management. She is currently on semaglutide for weight management and has lost one pound since her last visit. Her current medications include semaglutide, losartan , hydrochlorothiazide , and amlodipine  for blood pressure, and Lipitor for cholesterol.  She follows a 1200 calorie nutrition plan about 60% of the time and eats two meals a day, typically skipping lunch. Her meals include items like eggs, bacon, bagels, and occasionally Chipotle bowls with brown rice, chicken, and various toppings. She often eats dinner late due to staying up late at night and acknowledges consuming more chocolate than usual due to her sister's visit. She drinks sodas occasionally and recently made lemonade with sugar.  She engages in physical activity once a week for 45  minutes, including strength training and chair yoga. She has been incorporating more physical activity into her routine, including walking and chair yoga, with her sister also participating in walking activities.     Challenges affecting patient progress: Low volume of physical activity and menopause.    Pharmacotherapy for weight management: She is currently taking semaglutide.   Assessment and Plan   Treatment Plan For Obesity:  Recommended Dietary Goals  Ashley York is currently in the action stage of change. As such, her goal is to continue weight management plan. She has agreed to: continue current plan  Behavioral Health and Counseling  We discussed the following behavioral modification strategies today: continue to work on maintaining a reduced calorie state, getting the recommended amount of protein, incorporating whole foods, making healthy choices, staying well hydrated and practicing mindfulness when eating. and increase protein intake, fibrous foods (25 grams per day for women, 30 grams for men) and water  to improve satiety and decrease hunger signals. .  Additional education and resources provided today: None  Recommended Physical Activity Goals  Ashley York has been advised to work up to 150 minutes of moderate intensity aerobic activity a week and strengthening exercises 2-3 times per week for cardiovascular health, weight loss maintenance and preservation of muscle mass.  She has agreed to :  Think about enjoyable ways to increase daily physical activity and overcoming barriers to exercise, Increase physical activity in their day and reduce sedentary time (increase NEAT)., Increase volume of physical activity to a goal of 240 minutes a week, and Combine aerobic and strengthening exercises for efficiency and improved cardiometabolic health.  Medical Interventions and Pharmacotherapy  We discussed various medication options to help Ashley York with her weight loss efforts and we  both agreed to : Adequate clinical response to anti-obesity medication, continue current anti-obesity regimen  Associated Conditions Impacted by Obesity Treatment  Assessment & Plan Essential hypertension  Class 2 severe obesity with serious comorbidity and body mass index (BMI) of 39.0 to 39.9 in adult, unspecified obesity type  Prediabetes  OSA (obstructive sleep apnea) - mild on CPAP  Pure hypercholesterolemia     Assessment and Plan Assessment & Plan Obesity Management is ongoing with a focus on weight loss to improve associated conditions. She has lost 23 pounds over six months, indicating progress. Currently on semaglutide for appetite control, which is effective in reducing food intake. Following a 1200 calorie nutrition plan 60% of the time and exercises 45 minutes once a week. Emphasis on increasing physical activity and dietary adjustments to achieve a calorie deficit for weight loss. Discussed the importance of protein intake and reducing carbohydrate intake to improve metabolism and prevent weight regain. - Continue semaglutide 0.25 mg for appetite control. - Increase physical activity, focusing on walking, aiming to increase frequency and duration weekly. - Maintain a calorie deficit by adhering to a 1200 calorie nutrition plan. - Incorporate protein-rich meals and snacks to support metabolism and muscle mass. - Consider using frozen meals from [employer] for convenience and calorie control. - Encourage accountability with her sister for lifestyle changes.  Hypertension Managed with losartan , hydrochlorothiazide , and amlodipine . Blood pressure today was 138/84 mmHg, slightly above the target of under 130/80 mmHg. She is compliant with her medication regimen. - Continue current antihypertensive medications: losartan , hydrochlorothiazide , and amlodipine . - Monitor blood pressure regularly to ensure it remains under 130/80 mmHg.  Obstructive sleep apnea Addressed through  weight management, which is expected to improve symptoms.  Prediabetes Managed through weight loss and dietary modifications. Emphasis on reducing carbohydrate intake and increasing protein to prevent progression to diabetes. - Continue dietary modifications to reduce carbohydrate intake and increase protein. - Continue Ozempic 0.25 mg once a week  Hypercholesterolemia Managed with Lipitor. Weight loss and dietary changes are expected to further improve lipid levels. - Continue Lipitor for cholesterol management. - Encouraged dietary changes to support cholesterol reduction.       Objective   Physical Exam:  Blood pressure 138/84, pulse 78, temperature 98 F (36.7 C), height 5' 5.5 (1.664 m), weight 240 lb (108.9 kg), SpO2 100%. Body mass index is 39.33 kg/m.  General: She is overweight, cooperative, alert, well developed, and in no acute distress. PSYCH: Has normal mood, affect and thought process.   HEENT: EOMI, sclerae are anicteric. Lungs: Normal breathing effort, no conversational dyspnea. Extremities: No edema.  Neurologic: No gross sensory or motor deficits. No tremors or fasciculations noted.    Diagnostic Data Reviewed:  BMET    Component Value Date/Time   NA 142 02/29/2024 1421   K 3.9 02/29/2024 1421   CL 103 02/29/2024 1421   CO2 27 02/29/2024 1421   GLUCOSE 92 02/29/2024 1421   GLUCOSE 158 (H) 12/02/2020 0304   BUN 14 02/29/2024 1421   CREATININE 0.77 02/29/2024 1421   CALCIUM  9.1 02/29/2024 1421   GFRNONAA >60 12/02/2020 0304   GFRAA  10/24/2008 1246    >60        The eGFR has been calculated using the MDRD equation. This calculation has not been validated in all clinical situations. eGFR's persistently <60 mL/min signify possible Chronic Kidney Disease.  Lab Results  Component Value Date   HGBA1C 5.9 (H) 02/29/2024   HGBA1C 6.0 (H) 11/19/2020   Lab Results  Component Value Date   INSULIN  9.7 09/19/2023   Lab Results  Component Value  Date   TSH 2.280 09/19/2023   CBC    Component Value Date/Time   WBC 6.6 09/19/2023 0942   WBC 12.1 (H) 12/02/2020 0304   RBC 4.58 09/19/2023 0942   RBC 3.72 (L) 12/02/2020 0304   HGB 11.8 09/19/2023 0942   HCT 39.4 09/19/2023 0942   PLT 239 09/19/2023 0942   MCV 86 09/19/2023 0942   MCH 25.8 (L) 09/19/2023 0942   MCH 26.1 12/02/2020 0304   MCHC 29.9 (L) 09/19/2023 0942   MCHC 31.3 12/02/2020 0304   RDW 13.5 09/19/2023 0942   Iron Studies    Component Value Date/Time   IRON 48 09/19/2023 0942   TIBC 329 09/19/2023 0942   FERRITIN 45 09/19/2023 0942   IRONPCTSAT 15 09/19/2023 0942   Lipid Panel     Component Value Date/Time   CHOL 169 09/19/2023 0942   TRIG 83 09/19/2023 0942   HDL 71 09/19/2023 0942   LDLCALC 83 09/19/2023 0942   Hepatic Function Panel     Component Value Date/Time   PROT 7.2 02/29/2024 1421   ALBUMIN 4.1 02/29/2024 1421   AST 23 02/29/2024 1421   ALT 12 02/29/2024 1421   ALKPHOS 97 02/29/2024 1421   BILITOT 0.3 02/29/2024 1421   BILIDIR <0.10 01/12/2023 1501      Component Value Date/Time   TSH 2.280 09/19/2023 0942   Nutritional Lab Results  Component Value Date   VD25OH 54.7 09/19/2023    Medications: Outpatient Encounter Medications as of 04/03/2024  Medication Sig   Accu-Chek Softclix Lancets lancets Use to check your blood sugar finger stick once a day Dx Code E11.9 for 90 days   amLODipine  (NORVASC ) 10 MG tablet Take 10 mg by mouth daily.   atorvastatin  (LIPITOR) 20 MG tablet Take 20 mg by mouth daily.   Blood Glucose Monitoring Suppl (ACCU-CHEK GUIDE) w/Device KIT as directed to check blood sugar once a day Dx Code E11.9   cholecalciferol (VITAMIN D ) 25 MCG (1000 UNIT) tablet Take 1,000 Units by mouth daily.   Coenzyme Q10 (COQ10) 100 MG CAPS Take 100 mg by mouth daily.   Cyanocobalamin  (B-12 PO) Take 1 tablet by mouth daily.   Insulin  Pen Needle (RELION MINI PEN NEEDLES) 31G X 6 MM MISC Inject 1 Needle into the skin once a  week.   levothyroxine  (SYNTHROID ) 150 MCG tablet Take 150 mcg by mouth daily before breakfast.   liothyronine (CYTOMEL) 5 MCG tablet Take 5 mcg by mouth daily.   losartan -hydrochlorothiazide  (HYZAAR) 100-25 MG tablet Take 1 tablet by mouth daily.    Magnesium  250 MG TABS Take 250 mg by mouth in the morning and at bedtime.   meloxicam (MOBIC) 15 MG tablet Take 1 tablet by mouth daily.   Omega-3 Fatty Acids (FISH OIL) 1000 MG CAPS 1 capsule Orally Once a day for 30 day(s)   Safety Seal Miscellaneous MISC Rx [employer] Minoxidil 7% Clobetasol  0.05% solution - apply to the affected areas of the scalp QAM.   Semaglutide,0.25 or 0.5MG /DOS, (OZEMPIC, 0.25 OR 0.5 MG/DOSE,) 2 MG/1.5ML SOPN Inject 0.25 mg into the skin once a week.   zinc gluconate 50 MG tablet Take 50 mg by mouth daily.   No facility-administered encounter medications on file as of 04/03/2024.  Follow-Up   No follow-ups on file.Ashley York She was informed of the importance of frequent follow up visits to maximize her success with intensive lifestyle modifications for her multiple health conditions.  Attestation Statement   Reviewed by clinician on day of visit: allergies, medications, problem list, medical history, surgical history, family history, social history, and previous encounter notes.     Lucas Parker, MD  "

## 2024-05-01 ENCOUNTER — Ambulatory Visit (INDEPENDENT_AMBULATORY_CARE_PROVIDER_SITE_OTHER): Admitting: Internal Medicine

## 2024-06-04 ENCOUNTER — Ambulatory Visit: Admitting: Dermatology
